# Patient Record
Sex: Female | Born: 1960 | Race: Black or African American | Hispanic: No | Marital: Single | Smoking: Current every day smoker
Health system: Southern US, Community
[De-identification: ages and names within clinical notes are randomized; demographics above are authoritative.]

## PROBLEM LIST (undated history)

## (undated) DIAGNOSIS — J449 Chronic obstructive pulmonary disease, unspecified: Secondary | ICD-10-CM

## (undated) DIAGNOSIS — I10 Essential (primary) hypertension: Secondary | ICD-10-CM

## (undated) DIAGNOSIS — E119 Type 2 diabetes mellitus without complications: Secondary | ICD-10-CM

## (undated) DIAGNOSIS — N289 Disorder of kidney and ureter, unspecified: Secondary | ICD-10-CM

## (undated) HISTORY — PX: TUBAL LIGATION: SHX77

---

## 1998-11-22 ENCOUNTER — Emergency Department (HOSPITAL_COMMUNITY): Admission: EM | Admit: 1998-11-22 | Discharge: 1998-11-22 | Payer: Self-pay | Admitting: Endocrinology

## 2000-03-23 ENCOUNTER — Emergency Department (HOSPITAL_COMMUNITY): Admission: EM | Admit: 2000-03-23 | Discharge: 2000-03-23 | Payer: Self-pay | Admitting: Emergency Medicine

## 2002-01-18 ENCOUNTER — Encounter: Payer: Self-pay | Admitting: Emergency Medicine

## 2002-01-18 ENCOUNTER — Emergency Department (HOSPITAL_COMMUNITY): Admission: EM | Admit: 2002-01-18 | Discharge: 2002-01-18 | Payer: Self-pay | Admitting: Emergency Medicine

## 2002-05-20 ENCOUNTER — Emergency Department (HOSPITAL_COMMUNITY): Admission: EM | Admit: 2002-05-20 | Discharge: 2002-05-20 | Payer: Self-pay | Admitting: Emergency Medicine

## 2002-07-21 ENCOUNTER — Emergency Department (HOSPITAL_COMMUNITY): Admission: EM | Admit: 2002-07-21 | Discharge: 2002-07-22 | Payer: Self-pay | Admitting: Emergency Medicine

## 2002-12-25 ENCOUNTER — Emergency Department (HOSPITAL_COMMUNITY): Admission: EM | Admit: 2002-12-25 | Discharge: 2002-12-25 | Payer: Self-pay | Admitting: *Deleted

## 2002-12-25 ENCOUNTER — Encounter: Payer: Self-pay | Admitting: *Deleted

## 2003-02-28 ENCOUNTER — Encounter: Payer: Self-pay | Admitting: Emergency Medicine

## 2003-02-28 ENCOUNTER — Emergency Department (HOSPITAL_COMMUNITY): Admission: EM | Admit: 2003-02-28 | Discharge: 2003-02-28 | Payer: Self-pay | Admitting: Physical Therapy

## 2003-08-14 ENCOUNTER — Emergency Department (HOSPITAL_COMMUNITY): Admission: EM | Admit: 2003-08-14 | Discharge: 2003-08-14 | Payer: Self-pay | Admitting: Emergency Medicine

## 2003-12-03 ENCOUNTER — Emergency Department (HOSPITAL_COMMUNITY): Admission: EM | Admit: 2003-12-03 | Discharge: 2003-12-03 | Payer: Self-pay | Admitting: *Deleted

## 2004-07-13 ENCOUNTER — Emergency Department (HOSPITAL_COMMUNITY): Admission: EM | Admit: 2004-07-13 | Discharge: 2004-07-13 | Payer: Self-pay | Admitting: Emergency Medicine

## 2005-07-05 ENCOUNTER — Emergency Department (HOSPITAL_COMMUNITY): Admission: EM | Admit: 2005-07-05 | Discharge: 2005-07-05 | Payer: Self-pay | Admitting: Emergency Medicine

## 2005-08-04 ENCOUNTER — Other Ambulatory Visit: Admission: RE | Admit: 2005-08-04 | Discharge: 2005-08-04 | Payer: Self-pay | Admitting: Obstetrics and Gynecology

## 2005-08-11 ENCOUNTER — Ambulatory Visit (HOSPITAL_COMMUNITY): Admission: RE | Admit: 2005-08-11 | Discharge: 2005-08-11 | Payer: Self-pay | Admitting: Obstetrics and Gynecology

## 2006-05-26 ENCOUNTER — Emergency Department (HOSPITAL_COMMUNITY): Admission: EM | Admit: 2006-05-26 | Discharge: 2006-05-27 | Payer: Self-pay | Admitting: Emergency Medicine

## 2006-12-08 ENCOUNTER — Emergency Department (HOSPITAL_COMMUNITY): Admission: EM | Admit: 2006-12-08 | Discharge: 2006-12-08 | Payer: Self-pay | Admitting: Emergency Medicine

## 2007-06-16 ENCOUNTER — Emergency Department (HOSPITAL_COMMUNITY): Admission: EM | Admit: 2007-06-16 | Discharge: 2007-06-16 | Payer: Self-pay | Admitting: Emergency Medicine

## 2007-10-17 ENCOUNTER — Emergency Department (HOSPITAL_COMMUNITY): Admission: EM | Admit: 2007-10-17 | Discharge: 2007-10-17 | Payer: Self-pay | Admitting: Emergency Medicine

## 2007-11-27 ENCOUNTER — Emergency Department (HOSPITAL_COMMUNITY): Admission: EM | Admit: 2007-11-27 | Discharge: 2007-11-27 | Payer: Self-pay | Admitting: Emergency Medicine

## 2008-02-05 ENCOUNTER — Emergency Department (HOSPITAL_COMMUNITY): Admission: EM | Admit: 2008-02-05 | Discharge: 2008-02-05 | Payer: Self-pay | Admitting: Emergency Medicine

## 2008-04-12 ENCOUNTER — Emergency Department (HOSPITAL_COMMUNITY): Admission: EM | Admit: 2008-04-12 | Discharge: 2008-04-12 | Payer: Self-pay | Admitting: Emergency Medicine

## 2008-04-30 ENCOUNTER — Emergency Department (HOSPITAL_COMMUNITY): Admission: EM | Admit: 2008-04-30 | Discharge: 2008-04-30 | Payer: Self-pay | Admitting: Emergency Medicine

## 2008-07-23 ENCOUNTER — Emergency Department (HOSPITAL_COMMUNITY): Admission: EM | Admit: 2008-07-23 | Discharge: 2008-07-23 | Payer: Self-pay | Admitting: Emergency Medicine

## 2008-10-21 ENCOUNTER — Emergency Department (HOSPITAL_COMMUNITY): Admission: EM | Admit: 2008-10-21 | Discharge: 2008-10-21 | Payer: Self-pay | Admitting: Emergency Medicine

## 2008-10-23 ENCOUNTER — Emergency Department (HOSPITAL_COMMUNITY): Admission: EM | Admit: 2008-10-23 | Discharge: 2008-10-23 | Payer: Self-pay | Admitting: Family Medicine

## 2008-12-29 ENCOUNTER — Emergency Department (HOSPITAL_COMMUNITY): Admission: EM | Admit: 2008-12-29 | Discharge: 2008-12-31 | Payer: Self-pay | Admitting: Emergency Medicine

## 2009-07-03 ENCOUNTER — Emergency Department (HOSPITAL_COMMUNITY): Admission: EM | Admit: 2009-07-03 | Discharge: 2009-07-03 | Payer: Self-pay | Admitting: Emergency Medicine

## 2010-05-01 ENCOUNTER — Inpatient Hospital Stay (HOSPITAL_COMMUNITY): Admission: EM | Admit: 2010-05-01 | Discharge: 2009-08-10 | Payer: Self-pay | Admitting: Emergency Medicine

## 2010-06-15 ENCOUNTER — Encounter: Payer: Self-pay | Admitting: Otolaryngology

## 2010-08-13 LAB — COMPREHENSIVE METABOLIC PANEL
ALT: 35 U/L (ref 0–35)
Albumin: 4.3 g/dL (ref 3.5–5.2)
BUN: 10 mg/dL (ref 6–23)
CO2: 22 mEq/L (ref 19–32)
Chloride: 100 mEq/L (ref 96–112)
Creatinine, Ser: 0.96 mg/dL (ref 0.4–1.2)
GFR calc Af Amer: >60 mL/min (ref >60)
Glucose, Bld: 97 mg/dL (ref 70–99)
Total Bilirubin: 1.9 mg/dL — ABNORMAL HIGH (ref 0.3–1.2)

## 2010-08-13 LAB — CBC
Hemoglobin: 15.3 g/dL — ABNORMAL HIGH (ref 12.0–15.0)
Platelets: 218 10*3/uL (ref 150–400)
WBC: 7.1 10*3/uL (ref 4.0–10.5)

## 2010-08-13 LAB — ETHANOL: Alcohol, Ethyl (B): 64 mg/dL — ABNORMAL HIGH (ref 0–10)

## 2010-08-13 LAB — RAPID URINE DRUG SCREEN, HOSP PERFORMED
Amphetamines: NOT DETECTED
Cocaine: POSITIVE — AB
Opiates: NOT DETECTED

## 2010-08-13 LAB — DIFFERENTIAL
Basophils Relative: 1 % (ref 0–1)
Lymphs Abs: 0.6 10*3/uL — ABNORMAL LOW (ref 0.7–4.0)
Monocytes Absolute: 0.3 10*3/uL (ref 0.1–1.0)
Neutrophils Relative %: 86 % — ABNORMAL HIGH (ref 43–77)

## 2010-08-15 LAB — CBC
HCT: 37.4 % (ref 36.0–46.0)
HCT: 41.3 % (ref 36.0–46.0)
Hemoglobin: 12.9 g/dL (ref 12.0–15.0)
MCHC: 34 g/dL (ref 30.0–36.0)
MCHC: 34.4 g/dL (ref 30.0–36.0)
MCV: 96.2 fL (ref 78.0–100.0)
Platelets: 189 10*3/uL (ref 150–400)
RBC: 3.89 MIL/uL (ref 3.87–5.11)
RBC: 4.3 MIL/uL (ref 3.87–5.11)
WBC: 8.2 10*3/uL (ref 4.0–10.5)

## 2010-08-15 LAB — BASIC METABOLIC PANEL
CO2: 21 mEq/L (ref 19–32)
Chloride: 107 mEq/L (ref 96–112)
Creatinine, Ser: 0.7 mg/dL (ref 0.4–1.2)
GFR calc Af Amer: >60 mL/min (ref >60)
Potassium: 4.1 mEq/L (ref 3.5–5.1)
Sodium: 138 mEq/L (ref 135–145)

## 2010-08-15 LAB — POCT I-STAT, CHEM 8
BUN: 4 mg/dL — ABNORMAL LOW (ref 6–23)
Chloride: 107 mEq/L (ref 96–112)
Creatinine, Ser: 0.7 mg/dL (ref 0.4–1.2)
Sodium: 138 mEq/L (ref 135–145)
TCO2: 22 mmol/L (ref 0–100)

## 2010-08-15 LAB — APTT: aPTT: 29 seconds (ref 24–37)

## 2010-08-15 LAB — DIFFERENTIAL
Basophils Absolute: 0 10*3/uL (ref 0.0–0.1)
Basophils Relative: 1 % (ref 0–1)
Eosinophils Absolute: 0 10*3/uL (ref 0.0–0.7)
Lymphs Abs: 1.1 10*3/uL (ref 0.7–4.0)
Neutrophils Relative %: 84 % — ABNORMAL HIGH (ref 43–77)

## 2010-08-15 LAB — PROTIME-INR: INR: 1.16 (ref 0.00–1.49)

## 2010-08-30 LAB — POCT I-STAT, CHEM 8
Calcium, Ion: 1.13 mmol/L (ref 1.12–1.32)
Chloride: 100 mEq/L (ref 96–112)
Creatinine, Ser: 1.2 mg/dL (ref 0.4–1.2)
Glucose, Bld: 120 mg/dL — ABNORMAL HIGH (ref 70–99)
Potassium: 3.6 mEq/L (ref 3.5–5.1)

## 2010-08-30 LAB — URINALYSIS, ROUTINE W REFLEX MICROSCOPIC
Glucose, UA: NEGATIVE mg/dL
Ketones, ur: 15 mg/dL — AB
Protein, ur: 30 mg/dL — AB
pH: 5.5 (ref 5.0–8.0)

## 2010-08-30 LAB — DIFFERENTIAL
Basophils Absolute: 0 10*3/uL (ref 0.0–0.1)
Basophils Relative: 1 % (ref 0–1)
Eosinophils Relative: 3 % (ref 0–5)
Lymphocytes Relative: 28 % (ref 12–46)
Monocytes Absolute: 0.8 10*3/uL (ref 0.1–1.0)
Monocytes Relative: 10 % (ref 3–12)
Neutro Abs: 4.8 10*3/uL (ref 1.7–7.7)

## 2010-08-30 LAB — URINE MICROSCOPIC-ADD ON

## 2010-08-30 LAB — CBC
HCT: 46.8 % — ABNORMAL HIGH (ref 36.0–46.0)
Hemoglobin: 16.2 g/dL — ABNORMAL HIGH (ref 12.0–15.0)
MCHC: 34.5 g/dL (ref 30.0–36.0)
RBC: 4.87 MIL/uL (ref 3.87–5.11)
RDW: 12.9 % (ref 11.5–15.5)

## 2010-08-30 LAB — RAPID URINE DRUG SCREEN, HOSP PERFORMED
Cocaine: POSITIVE — AB
Opiates: NOT DETECTED
Tetrahydrocannabinol: POSITIVE — AB

## 2010-09-02 LAB — URINALYSIS, ROUTINE W REFLEX MICROSCOPIC
Bilirubin Urine: NEGATIVE
Glucose, UA: NEGATIVE mg/dL
Specific Gravity, Urine: 1.023 (ref 1.005–1.030)
pH: 5 (ref 5.0–8.0)

## 2010-09-02 LAB — POCT I-STAT, CHEM 8
BUN: 11 mg/dL (ref 6–23)
Calcium, Ion: 1.17 mmol/L (ref 1.12–1.32)
Chloride: 106 mEq/L (ref 96–112)
Creatinine, Ser: 1 mg/dL (ref 0.4–1.2)
Glucose, Bld: 89 mg/dL (ref 70–99)
HCT: 47 % — ABNORMAL HIGH (ref 36.0–46.0)
Hemoglobin: 16 g/dL — ABNORMAL HIGH (ref 12.0–15.0)
Potassium: 3.8 meq/L (ref 3.5–5.1)
Sodium: 138 meq/L (ref 135–145)
TCO2: 24 mmol/L (ref 0–100)

## 2010-09-02 LAB — CBC
HCT: 43.6 % (ref 36.0–46.0)
MCHC: 34.3 g/dL (ref 30.0–36.0)
MCV: 91.5 fL (ref 78.0–100.0)
Platelets: 187 10*3/uL (ref 150–400)
WBC: 5.5 10*3/uL (ref 4.0–10.5)

## 2010-09-02 LAB — DIFFERENTIAL
Basophils Relative: 1 % (ref 0–1)
Eosinophils Absolute: 0.2 10*3/uL (ref 0.0–0.7)
Eosinophils Relative: 4 % (ref 0–5)
Lymphs Abs: 2.4 10*3/uL (ref 0.7–4.0)

## 2010-09-02 LAB — URINE CULTURE

## 2010-09-02 LAB — GLUCOSE, CAPILLARY: Glucose-Capillary: 88 mg/dL (ref 70–99)

## 2010-09-02 LAB — URINE MICROSCOPIC-ADD ON

## 2011-02-12 LAB — BASIC METABOLIC PANEL
CO2: 33 — ABNORMAL HIGH
Calcium: 9.6
Creatinine, Ser: 0.78
GFR calc Af Amer: >60
GFR calc non Af Amer: >60
Glucose, Bld: 105 — ABNORMAL HIGH

## 2011-02-12 LAB — DIFFERENTIAL
Basophils Absolute: 0.2 — ABNORMAL HIGH
Basophils Relative: 3 — ABNORMAL HIGH
Neutro Abs: 3
Neutrophils Relative %: 48

## 2011-02-12 LAB — POCT CARDIAC MARKERS: Operator id: 4295

## 2011-02-12 LAB — CBC
MCHC: 33
RBC: 4.56
RDW: 19.7 — ABNORMAL HIGH

## 2011-02-18 LAB — POCT I-STAT, CHEM 8
BUN: 9
Calcium, Ion: 1.16
Creatinine, Ser: 1
Glucose, Bld: 96
Sodium: 142
TCO2: 26

## 2011-02-18 LAB — POCT CARDIAC MARKERS: Myoglobin, poc: 36.7

## 2011-02-24 LAB — CBC
Hemoglobin: 13.6
MCHC: 33.6
RBC: 4.34

## 2011-02-24 LAB — POCT CARDIAC MARKERS
CKMB, poc: <1 — ABNORMAL LOW
Myoglobin, poc: 41.2

## 2011-02-24 LAB — DIFFERENTIAL
Band Neutrophils: 0
Basophils Absolute: 0
Basophils Relative: 0
Eosinophils Absolute: 0.3
Lymphocytes Relative: 50 — ABNORMAL HIGH
Lymphs Abs: 2.8
Monocytes Absolute: 0.6
Monocytes Relative: 10

## 2011-02-24 LAB — BASIC METABOLIC PANEL
CO2: 26
Calcium: 9.6
GFR calc Af Amer: >60
GFR calc non Af Amer: >60
Sodium: 140

## 2011-03-09 LAB — BASIC METABOLIC PANEL
Calcium: 9.5
GFR calc Af Amer: >60
GFR calc non Af Amer: >60
Sodium: 139

## 2011-03-09 LAB — POCT CARDIAC MARKERS
CKMB, poc: <1 — ABNORMAL LOW
Myoglobin, poc: 36.9

## 2011-03-23 ENCOUNTER — Inpatient Hospital Stay (INDEPENDENT_AMBULATORY_CARE_PROVIDER_SITE_OTHER)
Admission: RE | Admit: 2011-03-23 | Discharge: 2011-03-23 | Disposition: A | Discharge status: Home / Self Care | Payer: Self-pay | Source: Ambulatory Visit | Attending: Family Medicine | Admitting: Family Medicine

## 2011-03-23 DIAGNOSIS — K089 Disorder of teeth and supporting structures, unspecified: Secondary | ICD-10-CM

## 2013-01-02 ENCOUNTER — Emergency Department (HOSPITAL_COMMUNITY): Payer: Self-pay

## 2013-01-02 ENCOUNTER — Emergency Department (HOSPITAL_COMMUNITY)
Admission: EM | Admit: 2013-01-02 | Discharge: 2013-01-02 | Disposition: A | Discharge status: Home / Self Care | Payer: Self-pay | Attending: Emergency Medicine | Admitting: Emergency Medicine

## 2013-01-02 ENCOUNTER — Encounter (HOSPITAL_COMMUNITY): Payer: Self-pay | Admitting: Emergency Medicine

## 2013-01-02 DIAGNOSIS — R0602 Shortness of breath: Secondary | ICD-10-CM | POA: Insufficient documentation

## 2013-01-02 DIAGNOSIS — Z79899 Other long term (current) drug therapy: Secondary | ICD-10-CM | POA: Insufficient documentation

## 2013-01-02 DIAGNOSIS — J029 Acute pharyngitis, unspecified: Secondary | ICD-10-CM | POA: Insufficient documentation

## 2013-01-02 DIAGNOSIS — K219 Gastro-esophageal reflux disease without esophagitis: Secondary | ICD-10-CM | POA: Insufficient documentation

## 2013-01-02 DIAGNOSIS — M546 Pain in thoracic spine: Secondary | ICD-10-CM | POA: Insufficient documentation

## 2013-01-02 DIAGNOSIS — J3489 Other specified disorders of nose and nasal sinuses: Secondary | ICD-10-CM | POA: Insufficient documentation

## 2013-01-02 DIAGNOSIS — R05 Cough: Secondary | ICD-10-CM

## 2013-01-02 DIAGNOSIS — J449 Chronic obstructive pulmonary disease, unspecified: Secondary | ICD-10-CM

## 2013-01-02 DIAGNOSIS — F17200 Nicotine dependence, unspecified, uncomplicated: Secondary | ICD-10-CM | POA: Insufficient documentation

## 2013-01-02 DIAGNOSIS — J441 Chronic obstructive pulmonary disease with (acute) exacerbation: Secondary | ICD-10-CM | POA: Insufficient documentation

## 2013-01-02 HISTORY — DX: Chronic obstructive pulmonary disease, unspecified: J44.9

## 2013-01-02 LAB — CBC WITH DIFFERENTIAL/PLATELET
Basophils Relative: 1 % (ref 0–1)
Eosinophils Absolute: 0.4 10*3/uL (ref 0.0–0.7)
Eosinophils Relative: 6 % — ABNORMAL HIGH (ref 0–5)
Hemoglobin: 15.4 g/dL — ABNORMAL HIGH (ref 12.0–15.0)
MCH: 32.3 pg (ref 26.0–34.0)
MCHC: 36.1 g/dL — ABNORMAL HIGH (ref 30.0–36.0)
MCV: 89.5 fL (ref 78.0–100.0)
Monocytes Relative: 11 % (ref 3–12)
Neutrophils Relative %: 48 % (ref 43–77)
Platelets: 187 10*3/uL (ref 150–400)

## 2013-01-02 LAB — BASIC METABOLIC PANEL
BUN: 10 mg/dL (ref 6–23)
Calcium: 9.3 mg/dL (ref 8.4–10.5)
GFR calc Af Amer: >90 mL/min (ref >90)
GFR calc non Af Amer: >90 mL/min (ref >90)
Potassium: 3.9 mEq/L (ref 3.5–5.1)

## 2013-01-02 MED ORDER — IBUPROFEN 800 MG PO TABS
800.0000 mg | ORAL_TABLET | Freq: Once | ORAL | Status: AC
  Administered 2013-01-02: 800 mg via ORAL
  Filled 2013-01-02: qty 1

## 2013-01-02 MED ORDER — PREDNISONE 20 MG PO TABS
60.0000 mg | ORAL_TABLET | Freq: Once | ORAL | Status: AC
  Administered 2013-01-02: 60 mg via ORAL
  Filled 2013-01-02: qty 3

## 2013-01-02 MED ORDER — GUAIFENESIN 100 MG/5ML PO LIQD: 100.0000 mg | ORAL | Status: DC | PRN

## 2013-01-02 MED ORDER — PREDNISONE 20 MG PO TABS: 40.0000 mg | ORAL_TABLET | Freq: Every day | ORAL | Status: DC

## 2013-01-02 NOTE — ED Notes (Signed)
Started to feel bad last night w/ sore throat ahs had a cough for 1 days cp started yesterday at 7 pm thinks it is from coughing hard started in back and it went around to chest no n/v

## 2013-01-02 NOTE — ED Notes (Signed)
Pt ambulated in hallways. Pt denies SOB, dizziness, lightheadedness or CP. Pt O2 sats consistently 98-100%/RA HR 75-85bpm. Pt had steady gait.

## 2013-01-02 NOTE — ED Provider Notes (Signed)
CSN: [id number]     Arrival date & time 01/02/13  8657 History     First MD Initiated Contact with Patient 01/02/13 0848     Chief Complaint  Patient presents with  . Chest Pain  . Sore Throat   (Consider location/radiation/quality/duration/timing/severity/associated sxs/prior Treatment) HPI Comments:  Patient is a 52 year old female with a history of COPD, GERD, and tobacco use, smoking 1ppd, who presents for back and chest pain with associated congested, nonproductive cough with onset yesterday. Patient states that symptoms started yesterday as pain in her thoracic back. Pain radiated around to chest and was without modifying factors; patient has tried repositioning for symptomatic relief. She states cough followed pain yesterday and has been worsening since onset. Patient denies associated fever, syncope, vision changes, nausea, vomiting, numbness/tingling, and extremity weakness. Patient has not cut back on smoking since symptoms onset. Denies relationship with PCP or pulmonologist. Patient works at a group home. Denies hx of ACS, CAD, and PE.  Patient is a 52 y.o. female presenting with chest pain and pharyngitis. The history is provided by the patient. No language interpreter was used.  Chest Pain Associated symptoms: cough and shortness of breath   Associated symptoms: no dizziness, no fever, no nausea, no numbness, not vomiting and no weakness   Sore Throat Associated symptoms include chest pain and coughing. Pertinent negatives include no fever, nausea, numbness, vomiting or weakness.    Past Medical History  Diagnosis Date  . COPD (chronic obstructive pulmonary disease)    No past surgical history on file. No family history on file. History  Substance Use Topics  . Smoking status: Current Every Day Smoker  . Smokeless tobacco: Not on file  . Alcohol Use: Yes   OB History   Grav Para Term Preterm Abortions TAB SAB Ect Mult Living                 Review of Systems   Constitutional: Negative for fever.  Eyes: Negative for visual disturbance.  Respiratory: Positive for cough and shortness of breath.   Cardiovascular: Positive for chest pain.  Gastrointestinal: Negative for nausea, vomiting and diarrhea.  Neurological: Negative for dizziness, syncope, weakness, light-headedness and numbness.  All other systems reviewed and are negative.   Allergies  Review of patient's allergies indicates no known allergies.  Home Medications   Current Outpatient Rx  Name  Route  Sig  Dispense  Refill  . guaiFENesin (ROBITUSSIN) 100 MG/5ML liquid   Oral   Take 5-10 mLs (100-200 mg total) by mouth every 4 (four) hours as needed for cough.   60 mL   0   . predniSONE (DELTASONE) 20 MG tablet   Oral   Take 2 tablets (40 mg total) by mouth daily.   10 tablet   0    BP 158/107  Pulse 78  Temp(Src) 98.3 F (36.8 C) (Oral)  Resp 16  SpO2 98%  Physical Exam  Nursing note and vitals reviewed. Constitutional: She is oriented to person, place, and time. She appears well-developed and well-nourished. No distress.  HENT:  Head: Normocephalic and atraumatic.  Mouth/Throat: Oropharynx is clear and moist. No oropharyngeal exudate.  Eyes: Conjunctivae and EOM are normal. Pupils are equal, round, and reactive to light. No scleral icterus.  Neck: Normal range of motion. Neck supple.  Cardiovascular: Normal rate, regular rhythm, normal heart sounds and intact distal pulses.   Pulmonary/Chest: Effort normal. No respiratory distress. She has no wheezes. She has no rales.  +adventitious sounds at  b/l bases  Abdominal: Soft. She exhibits no distension. There is no tenderness.  Musculoskeletal: Normal range of motion.  Lymphadenopathy:    She has no cervical adenopathy.  Neurological: She is alert and oriented to person, place, and time.  Skin: Skin is warm and dry. No rash noted. She is not diaphoretic. No erythema. No pallor.  Psychiatric: She has a normal mood and  affect. Her behavior is normal.   ED Course   Procedures (including critical care time)  Labs Reviewed  CBC WITH DIFFERENTIAL - Abnormal; Notable for the following:    Hemoglobin 15.4 (*)    MCHC 36.1 (*)    Eosinophils Relative 6 (*)    All other components within normal limits  BASIC METABOLIC PANEL  POCT I-STAT TROPONIN I   Dg Chest 2 View  01/02/2013   *RADIOLOGY REPORT*  Clinical Data: Shortness of breath  CHEST - 2 VIEW  Comparison: 08/07/2009  Findings: Normal heart size and vascularity.  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  No superimposed pneumonia, collapse or consolidation.  No effusion or pneumothorax.  Trachea is midline.  IMPRESSION: COPD/emphysema.  No superimposed acute process   Original Report Authenticated By: Judie Petit. Miles Costain, M.D.    Date: 01/07/2013  Rate: 80  Rhythm: normal sinus rhythm  QRS Axis: normal  Intervals: normal  ST/T Wave abnormalities: normal  Conduction Disutrbances:none  Narrative Interpretation: NSR with probable left atrial enlargement; no STEMI or ischemic changes  Old EKG Reviewed: unchanged from 08/07/2009 I have personally reviewed and interpreted this EKG  1. COPD (chronic obstructive pulmonary disease)   2. Cough    MDM  Patient presents for back pain radiating to chest x 2 days. Physical exam findings as above and patient well and nontoxic appearing; afebrile. Labs without leukocytosis, anemia, hemoconcentration, or electrolyte imbalance. Kidney function preserved. EKG unchanged from prior and troponin 0.00; doubt ACS given atypical nature of symptoms, work up, labs, and stable EKG. CXR without evidence of pneumonia, PTX, pleural effusion, or mass/lesion. Doubt PE given lack of tachycardia, dyspnea, tachypnea, and hypoxia. Also doubt dissection given hemostability and lack of mediastinal widening or change in vascularity on CXR. Symptoms most consistent with COPD; changes evident on CXR. Appopriate for d/c  with PCP follow up for further evaluation of symptoms. Rx for prednisone and Robitussin provided for symptoms. Indications for ED return discussed and patient agreeable to plan.     Antony Madura, PA-C 01/07/13 1255

## 2013-01-03 ENCOUNTER — Encounter (HOSPITAL_COMMUNITY): Payer: Self-pay | Admitting: Nurse Practitioner

## 2013-01-03 ENCOUNTER — Emergency Department (HOSPITAL_COMMUNITY)
Admission: EM | Admit: 2013-01-03 | Discharge: 2013-01-03 | Disposition: A | Discharge status: Home / Self Care | Payer: Self-pay | Attending: Emergency Medicine | Admitting: Emergency Medicine

## 2013-01-03 DIAGNOSIS — J441 Chronic obstructive pulmonary disease with (acute) exacerbation: Secondary | ICD-10-CM | POA: Insufficient documentation

## 2013-01-03 DIAGNOSIS — R197 Diarrhea, unspecified: Secondary | ICD-10-CM | POA: Insufficient documentation

## 2013-01-03 DIAGNOSIS — T380X5A Adverse effect of glucocorticoids and synthetic analogues, initial encounter: Secondary | ICD-10-CM | POA: Insufficient documentation

## 2013-01-03 DIAGNOSIS — R112 Nausea with vomiting, unspecified: Secondary | ICD-10-CM | POA: Insufficient documentation

## 2013-01-03 DIAGNOSIS — F17200 Nicotine dependence, unspecified, uncomplicated: Secondary | ICD-10-CM | POA: Insufficient documentation

## 2013-01-03 MED ORDER — ONDANSETRON 4 MG PO TBDP: 4.0000 mg | ORAL_TABLET | Freq: Three times a day (TID) | ORAL | Status: DC | PRN

## 2013-01-03 MED ORDER — ONDANSETRON 4 MG PO TBDP
4.0000 mg | ORAL_TABLET | Freq: Once | ORAL | Status: AC
  Administered 2013-01-03: 4 mg via ORAL
  Filled 2013-01-03: qty 1

## 2013-01-03 MED ORDER — ALBUTEROL SULFATE HFA 108 (90 BASE) MCG/ACT IN AERS: 1.0000 | INHALATION_SPRAY | Freq: Four times a day (QID) | RESPIRATORY_TRACT | Status: DC | PRN

## 2013-01-03 NOTE — ED Notes (Signed)
Pt was started on prednisone yesterday for copd but she states it is making her vomit and poop and she cant tolerate it

## 2013-01-03 NOTE — ED Provider Notes (Signed)
CSN: [id number]     Arrival date & time 01/03/13  0911 History     First MD Initiated Contact with Patient 01/03/13 1008     Chief Complaint  Patient presents with  . Medication Reaction   (Consider location/radiation/quality/duration/timing/severity/associated sxs/prior Treatment) The history is provided by the patient and medical records.   Patient presents to the ED for adverse reaction from prednisone. Patient states she was given prednisone yesterday at an ED visit for COPD exacerbation. She took medication as directed, but now is having severe nausea, vomiting, and diarrhea. She has never taken prednisone before.  Denies any blood in emesis or stool.  No recent use of antibiotics. Patient states she would like to complete a course of prednisone because it does help her breathing. Does not have an inhaler for home use.  No fevers, sweats, or chills.  Denies any chest pain, SOB, or abdominal pain.  Past Medical History  Diagnosis Date  . COPD (chronic obstructive pulmonary disease)    History reviewed. No pertinent past surgical history. History reviewed. No pertinent family history. History  Substance Use Topics  . Smoking status: Current Every Day Smoker  . Smokeless tobacco: Not on file  . Alcohol Use: Yes   OB History   Grav Para Term Preterm Abortions TAB SAB Ect Mult Living                 Review of Systems  Gastrointestinal: Positive for nausea, vomiting and diarrhea.  All other systems reviewed and are negative.    Allergies  Review of patient's allergies indicates no known allergies.  Home Medications   Current Outpatient Rx  Name  Route  Sig  Dispense  Refill  . ibuprofen (ADVIL,MOTRIN) 200 MG tablet   Oral   Take 400 mg by mouth once.         Marland Kitchen albuterol (PROVENTIL HFA;VENTOLIN HFA) 108 (90 BASE) MCG/ACT inhaler   Inhalation   Inhale 1-2 puffs into the lungs every 6 (six) hours as needed for wheezing.   1 Inhaler   0   . ondansetron (ZOFRAN ODT)  4 MG disintegrating tablet   Oral   Take 1 tablet (4 mg total) by mouth every 8 (eight) hours as needed for nausea.   10 tablet   0    BP 143/65  Pulse 110  Temp(Src) 98.5 F (36.9 C) (Oral)  Resp 17  Ht 5\' 2"  (1.575 m)  Wt 130 lb (58.968 kg)  BMI 23.77 kg/m2  SpO2 96%  Physical Exam  Nursing note and vitals reviewed. Constitutional: She is oriented to person, place, and time. She appears well-developed and well-nourished.  HENT:  Head: Normocephalic and atraumatic.  Mouth/Throat: Oropharynx is clear and moist.  Eyes: Conjunctivae and EOM are normal. Pupils are equal, round, and reactive to light.  Neck: Normal range of motion.  Cardiovascular: Normal rate, regular rhythm and normal heart sounds.   Pulmonary/Chest: Effort normal and breath sounds normal.  Abdominal: Soft. Bowel sounds are normal. There is no tenderness. There is no guarding, no CVA tenderness, no tenderness at McBurney's point and negative Murphy's sign.  Musculoskeletal: Normal range of motion.  Neurological: She is alert and oriented to person, place, and time.  Skin: Skin is warm and dry.  Psychiatric: She has a normal mood and affect.    ED Course   Procedures (including critical care time)  Labs Reviewed - No data to display Dg Chest 2 View  01/02/2013   *RADIOLOGY REPORT*  Clinical Data: Shortness of breath  CHEST - 2 VIEW  Comparison: 08/07/2009  Findings: Normal heart size and vascularity.  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  No superimposed pneumonia, collapse or consolidation.  No effusion or pneumothorax.  Trachea is midline.  IMPRESSION: COPD/emphysema.  No superimposed acute process   Original Report Authenticated By: Judie Petit. Shick, M.D.   1. Nausea & vomiting   2. Diarrhea     MDM   No active vomiting in the ED. Patient given Zofran and albuterol inhaler. I have advised her she can stop taking the prednisone but she states she would like to finish taking  it -- advised she can try taking zofran and imodium with prednisone but if no improvement she should stop prednisone.  Rx zofran and albuterol given.  FU with [facility] if sx not improving.  Discussed plan with pt, she agreed.  Return precautions advised.  Garlon Hatchet, PA-C 01/03/13 [phone]

## 2013-01-04 NOTE — ED Provider Notes (Signed)
Medical screening examination/treatment/procedure(s) were performed by non-physician practitioner and as supervising physician I was immediately available for consultation/collaboration.   Arish Redner M Zurich Carreno, DO 01/04/13 2206 

## 2013-01-09 NOTE — ED Provider Notes (Signed)
Medical screening examination/treatment/procedure(s) were performed by non-physician practitioner and as supervising physician I was immediately available for consultation/collaboration.  Taimane Stimmel R. Lashaunda Schild, MD 01/09/13 0755 

## 2014-01-30 ENCOUNTER — Emergency Department (HOSPITAL_COMMUNITY): Payer: Self-pay

## 2014-01-30 ENCOUNTER — Emergency Department (HOSPITAL_COMMUNITY)
Admission: EM | Admit: 2014-01-30 | Discharge: 2014-01-30 | Disposition: A | Discharge status: Home / Self Care | Payer: Self-pay | Attending: Emergency Medicine | Admitting: Emergency Medicine

## 2014-01-30 ENCOUNTER — Encounter (HOSPITAL_COMMUNITY): Payer: Self-pay | Admitting: Emergency Medicine

## 2014-01-30 DIAGNOSIS — J449 Chronic obstructive pulmonary disease, unspecified: Secondary | ICD-10-CM | POA: Insufficient documentation

## 2014-01-30 DIAGNOSIS — K5900 Constipation, unspecified: Secondary | ICD-10-CM | POA: Insufficient documentation

## 2014-01-30 DIAGNOSIS — J4489 Other specified chronic obstructive pulmonary disease: Secondary | ICD-10-CM | POA: Insufficient documentation

## 2014-01-30 DIAGNOSIS — R1084 Generalized abdominal pain: Secondary | ICD-10-CM | POA: Insufficient documentation

## 2014-01-30 DIAGNOSIS — F17200 Nicotine dependence, unspecified, uncomplicated: Secondary | ICD-10-CM | POA: Insufficient documentation

## 2014-01-30 LAB — COMPREHENSIVE METABOLIC PANEL
ALBUMIN: 4.1 g/dL (ref 3.5–5.2)
ALK PHOS: 101 U/L (ref 39–117)
ALT: 22 U/L (ref 0–35)
AST: 25 U/L (ref 0–37)
Anion gap: 12 (ref 5–15)
BILIRUBIN TOTAL: 0.8 mg/dL (ref 0.3–1.2)
BUN: 11 mg/dL (ref 6–23)
CHLORIDE: 97 meq/L (ref 96–112)
CO2: 28 mEq/L (ref 19–32)
Calcium: 9.4 mg/dL (ref 8.4–10.5)
Creatinine, Ser: 0.76 mg/dL (ref 0.50–1.10)
GFR calc Af Amer: >90 mL/min (ref >90)
GFR calc non Af Amer: >90 mL/min (ref >90)
Glucose, Bld: 105 mg/dL — ABNORMAL HIGH (ref 70–99)
POTASSIUM: 3.7 meq/L (ref 3.7–5.3)
Sodium: 137 mEq/L (ref 137–147)
Total Protein: 8.1 g/dL (ref 6.0–8.3)

## 2014-01-30 LAB — CBC WITH DIFFERENTIAL/PLATELET
BASOS ABS: 0 10*3/uL (ref 0.0–0.1)
BASOS PCT: 1 % (ref 0–1)
EOS ABS: 0.1 10*3/uL (ref 0.0–0.7)
EOS PCT: 1 % (ref 0–5)
HEMATOCRIT: 46.8 % — AB (ref 36.0–46.0)
Hemoglobin: 16.3 g/dL — ABNORMAL HIGH (ref 12.0–15.0)
LYMPHS PCT: 43 % (ref 12–46)
Lymphs Abs: 3 10*3/uL (ref 0.7–4.0)
MCH: 31.7 pg (ref 26.0–34.0)
MCHC: 34.8 g/dL (ref 30.0–36.0)
MCV: 90.9 fL (ref 78.0–100.0)
MONO ABS: 0.9 10*3/uL (ref 0.1–1.0)
Monocytes Relative: 12 % (ref 3–12)
Neutro Abs: 3.1 10*3/uL (ref 1.7–7.7)
Neutrophils Relative %: 43 % (ref 43–77)
Platelets: 236 10*3/uL (ref 150–400)
RBC: 5.15 MIL/uL — ABNORMAL HIGH (ref 3.87–5.11)
RDW: 12.3 % (ref 11.5–15.5)
WBC: 7.1 10*3/uL (ref 4.0–10.5)

## 2014-01-30 LAB — URINALYSIS, ROUTINE W REFLEX MICROSCOPIC
Glucose, UA: NEGATIVE mg/dL
KETONES UR: 15 mg/dL — AB
Nitrite: NEGATIVE
Protein, ur: NEGATIVE mg/dL
SPECIFIC GRAVITY, URINE: 1.03 (ref 1.005–1.030)
UROBILINOGEN UA: 1 mg/dL (ref 0.0–1.0)
pH: 5.5 (ref 5.0–8.0)

## 2014-01-30 LAB — URINE MICROSCOPIC-ADD ON

## 2014-01-30 LAB — LIPASE, BLOOD: LIPASE: 19 U/L (ref 11–59)

## 2014-01-30 LAB — POC OCCULT BLOOD, ED: Fecal Occult Bld: NEGATIVE

## 2014-01-30 MED ORDER — LACTULOSE 20 GM/30ML PO SOLN: 30.0000 mL | Freq: Three times a day (TID) | ORAL | Status: DC | PRN

## 2014-01-30 NOTE — ED Provider Notes (Signed)
CSN: [id number]     Arrival date & time 01/30/14  1015 History   First MD Initiated Contact with Patient 01/30/14 1238     Chief Complaint  Patient presents with  . Constipation  . Emesis     (Consider location/radiation/quality/duration/timing/severity/associated sxs/prior Treatment) HPI  Marissa Owens is a 53 y.o. female who states that she has had upper abdominal pain, for 3 weeks, associated with decreased stooling, and constipation. She also has intermittent coughing. She denies fever. She has had urinary frequency and odor with urination. She has a nonodorous vaginal discharge. She is post menopausal. She denies fever, chills, weakness, or dizziness. She tried milk of magnesia, without relief of the stooling problem. There are no other known modifying factors.   Past Medical History  Diagnosis Date  . COPD (chronic obstructive pulmonary disease)    History reviewed. No pertinent past surgical history. No family history on file. History  Substance Use Topics  . Smoking status: Current Every Day Smoker  . Smokeless tobacco: Not on file  . Alcohol Use: Yes   OB History   Grav Para Term Preterm Abortions TAB SAB Ect Mult Living                 Review of Systems  All other systems reviewed and are negative.     Allergies  Review of patient's allergies indicates no known allergies.  Home Medications   Prior to Admission medications   Medication Sig Start Date End Date Taking? Authorizing Provider  ibuprofen (ADVIL,MOTRIN) 200 MG tablet Take 400 mg by mouth once.   Yes Historical Provider, MD  Multiple Vitamin (MULTIVITAMIN) tablet Take 1 tablet by mouth daily.   Yes Historical Provider, MD  Lactulose 20 GM/30ML SOLN Take 30 mLs (20 g total) by mouth 3 (three) times daily with meals as needed (Constipation). 01/30/14   Flint Melter, MD   BP 155/106  Pulse 70  Temp(Src) 99.1 F (37.3 C)  Resp 17  Ht  (1.575 m)  Wt 136 lb (61.689 kg)  BMI 24.87 kg/m2   SpO2 94% Physical Exam  Nursing note and vitals reviewed. Constitutional: She is oriented to person, place, and time. She appears well-developed and well-nourished.  HENT:  Head: Normocephalic and atraumatic.  Eyes: Conjunctivae and EOM are normal. Pupils are equal, round, and reactive to light.  Neck: Normal range of motion and phonation normal. Neck supple.  Cardiovascular: Normal rate, regular rhythm and intact distal pulses.   Pulmonary/Chest: Effort normal and breath sounds normal. She exhibits no tenderness.  Abdominal: Soft. She exhibits distension (Mild). She exhibits no mass. There is tenderness (epigastric, mild). There is no rebound and no guarding.  Hyperactive bowel sounds  Musculoskeletal: Normal range of motion.  Neurological: She is alert and oriented to person, place, and time. She exhibits normal muscle tone.  Skin: Skin is warm and dry.  Psychiatric: She has a normal mood and affect. Her behavior is normal. Judgment and thought content normal.    ED Course  Procedures (including critical care time) Labs Review Labs Reviewed  COMPREHENSIVE METABOLIC PANEL - Abnormal; Notable for the following:    Glucose, Bld 105 (*)    All other components within normal limits  CBC WITH DIFFERENTIAL - Abnormal; Notable for the following:    RBC 5.15 (*)    Hemoglobin 16.3 (*)    HCT 46.8 (*)    All other components within normal limits  URINALYSIS, ROUTINE W REFLEX MICROSCOPIC - Abnormal; Notable  for the following:    Color, Urine AMBER (*)    Hgb urine dipstick MODERATE (*)    Bilirubin Urine SMALL (*)    Ketones, ur 15 (*)    Leukocytes, UA TRACE (*)    All other components within normal limits  URINE MICROSCOPIC-ADD ON - Abnormal; Notable for the following:    Crystals CA OXALATE CRYSTALS (*)    All other components within normal limits  LIPASE, BLOOD  POC OCCULT BLOOD, ED    Imaging Review No results found.   EKG Interpretation None      MDM   Final  diagnoses:  Generalized abdominal pain  Constipation, unspecified constipation type    Nonspecific abdominal pain, with clinical history for constipation. No evidence for acute urticarial infection, metabolic instability, or serious intra-abdominal process.  Nursing Notes Reviewed/ Care Coordinated Applicable Imaging Reviewed Interpretation of Laboratory Data incorporated into ED treatment  The patient appears reasonably screened and/or stabilized for discharge and I doubt any other medical condition or other [facility] requiring further screening, evaluation, or treatment in the ED at this time prior to discharge.  Plan: Home Medications- Lactulose for constipation; Home Treatments- rest; return here if the recommended treatment, does not improve the symptoms; Recommended follow up- PCP prn    Flint Melter, MD 02/02/14 0040

## 2014-01-30 NOTE — Discharge Instructions (Signed)
Drink plenty of fluids, and stay on a high-fiber diet and to your having regular bowel movements. See a medical doctor, if you're not feeling better in one or 2 weeks.     Abdominal Pain Many things can cause abdominal pain. Usually, abdominal pain is not caused by a disease and will improve without treatment. It can often be observed and treated at home. Your health care provider will do a physical exam and possibly order blood tests and X-rays to help determine the seriousness of your pain. However, in many cases, more time must pass before a clear cause of the pain can be found. Before that point, your health care provider may not know if you need more testing or further treatment. HOME CARE INSTRUCTIONS  Monitor your abdominal pain for any changes. The following actions may help to alleviate any discomfort you are experiencing:  Only take over-the-counter or prescription medicines as directed by your health care provider.  Do not take laxatives unless directed to do so by your health care provider.  Try a clear liquid diet (broth, tea, or water) as directed by your health care provider. Slowly move to a bland diet as tolerated. SEEK MEDICAL CARE IF:  You have unexplained abdominal pain.  You have abdominal pain associated with nausea or diarrhea.  You have pain when you urinate or have a bowel movement.  You experience abdominal pain that wakes you in the night.  You have abdominal pain that is worsened or improved by eating food.  You have abdominal pain that is worsened with eating fatty foods.  You have a fever. SEEK IMMEDIATE MEDICAL CARE IF:   Your pain does not go away within 2 hours.  You keep throwing up (vomiting).  Your pain is felt only in portions of the abdomen, such as the right side or the left lower portion of the abdomen.  You pass bloody or black tarry stools. MAKE SURE YOU:  Understand these instructions.   Will watch your condition.   Will get  help right away if you are not doing well or get worse.  Document Released: 02/18/2005 Document Revised: 05/16/2013 Document Reviewed: 01/18/2013 [facility] Patient Information 2015 [place], [place]. This information is not intended to replace advice given to you by your health care provider. Make sure you discuss any questions you have with your health care provider. High-Fiber Diet Fiber is found in fruits, vegetables, and grains. A high-fiber diet encourages the addition of more whole grains, legumes, fruits, and vegetables in your diet. The recommended amount of fiber for adult males is 38 g per day. For adult females, it is 25 g per day. Pregnant and lactating women should get 28 g of fiber per day. If you have a digestive or bowel problem, ask your caregiver for advice before adding high-fiber foods to your diet. Eat a variety of high-fiber foods instead of only a select few type of foods.  PURPOSE  To increase stool bulk.  To make bowel movements more regular to prevent constipation.  To lower cholesterol.  To prevent overeating. WHEN IS THIS DIET USED?  It may be used if you have constipation and hemorrhoids.  It may be used if you have uncomplicated diverticulosis (intestine condition) and irritable bowel syndrome.  It may be used if you need help with weight management.  It may be used if you want to add it to your diet as a protective measure against atherosclerosis, diabetes, and cancer. SOURCES OF FIBER  Whole-grain breads and  cereals.  Fruits, such as apples, oranges, bananas, berries, prunes, and pears.  Vegetables, such as green peas, carrots, sweet potatoes, beets, broccoli, cabbage, spinach, and artichokes.  Legumes, such split peas, soy, lentils.  Almonds. FIBER CONTENT IN FOODS Starches and Grains / Dietary Fiber (g)  Cheerios, 1 cup / 3 g  Corn Flakes cereal, 1 cup / 0.7 g  Rice crispy treat cereal, 1 cup / 0.3 g  Instant oatmeal (cooked),  cup / 2  g  Frosted wheat cereal, 1 cup / 5.1 g  Brown, long-grain rice (cooked), 1 cup / 3.5 g  White, long-grain rice (cooked), 1 cup / 0.6 g  Enriched macaroni (cooked), 1 cup / 2.5 g Legumes / Dietary Fiber (g)  Baked beans (canned, plain, or vegetarian),  cup / 5.2 g  Kidney beans (canned),  cup / 6.8 g  Pinto beans (cooked),  cup / 5.5 g Breads and Crackers / Dietary Fiber (g)  Plain or honey graham crackers, 2 squares / 0.7 g  Saltine crackers, 3 squares / 0.3 g  Plain, salted pretzels, 10 pieces / 1.8 g  Whole-wheat bread, 1 slice / 1.9 g  White bread, 1 slice / 0.7 g  Raisin bread, 1 slice / 1.2 g  Plain bagel, 3 oz / 2 g  Flour tortilla, 1 oz / 0.9 g  Corn tortilla, 1 small / 1.5 g  Hamburger or hotdog bun, 1 small / 0.9 g Fruits / Dietary Fiber (g)  Apple with skin, 1 medium / 4.4 g  Sweetened applesauce,  cup / 1.5 g  Banana,  medium / 1.5 g  Grapes, 10 grapes / 0.4 g  Orange, 1 small / 2.3 g  Raisin, 1.5 oz / 1.6 g  Melon, 1 cup / 1.4 g Vegetables / Dietary Fiber (g)  Green beans (canned),  cup / 1.3 g  Carrots (cooked),  cup / 2.3 g  Broccoli (cooked),  cup / 2.8 g  Peas (cooked),  cup / 4.4 g  Mashed potatoes,  cup / 1.6 g  Lettuce, 1 cup / 0.5 g  Corn (canned),  cup / 1.6 g  Tomato,  cup / 1.1 g Document Released: 05/11/2005 Document Revised: 11/10/2011 Document Reviewed: 08/13/2011 ExitCare Patient Information 2015 [place], [place]. This information is not intended to replace advice given to you by your health care provider. Make sure you discuss any questions you have with your health care provider.  Emergency Department Resource Guide 1) Find a Doctor and Pay Out of Pocket Although you won't have to find out who is covered by your insurance plan, it is a good idea to ask around and get recommendations. You will then need to call the office and see if the doctor you have chosen will accept you as a new patient and what  types of options they offer for patients who are self-pay. Some doctors offer discounts or will set up payment plans for their patients who do not have insurance, but you will need to ask so you aren't surprised when you get to your appointment.  2) Contact Your Local Health Department Not all health departments have doctors that can see patients for sick visits, but many do, so it is worth a call to see if yours does. If you don't know where your local health department is, you can check in your phone book. The CDC also has a tool to help you locate your state's health department, and many state websites also have listings of  all of their local health departments.  3) Find a Walk-in Clinic If your illness is not likely to be very severe or complicated, you may want to try a walk in clinic. These are popping up all over the country in pharmacies, drugstores, and shopping centers. They're usually staffed by nurse practitioners or physician assistants that have been trained to treat common illnesses and complaints. They're usually fairly quick and inexpensive. However, if you have serious medical issues or chronic medical problems, these are probably not your best option.  No Primary Care Doctor: - Call Health Connect at  [phone] - they can help you locate a primary care doctor that  accepts your insurance, provides certain services, etc. - Physician Referral Service- [phone]  Chronic Pain Problems: Organization         Address  Phone   Notes  [facility]  [phone] Patients need to be referred by their primary care doctor.   Medication Assistance: Organization         Address  Phone   Notes  [facility] Medication [facility] [address] [place] [place], [place] [address] [phone] --Must be a resident of [facility] -- Must have NO insurance coverage whatsoever (no Medicaid/ Medicare, etc.) -- The pt. MUST have a primary care doctor that  directs their care regularly and follows them in the community   [facility]  [phone]   [employer]  [phone]    Agencies that provide inexpensive medical care: Organization         Address  Phone   Notes  [facility]  [phone]   [facility]    [phone]   [facility] [address] [place], [place] [address] [phone]   [facility] [address], [place] [phone]   [facility]anned Parenthood    [phone]   [facility]    [phone]   [facility]  [address], [place] Phone:  [phone], Fax:  [phone] Hours of Operation:  9 am - 6 pm, M-F.  Also accepts Medicaid/Medicare and self-pay.  [facility]  [address], [place] Phone: [phone], Fax: [phone]. Hours of Operation:  8:30 am - 5:30 pm, M-F.  Also accepts Medicaid and self-pay.  [facility] [place] [address] [place] Phone: [phone]   [facility] [address] Natasha Bence [place], [place] [phone], Ext. [phone] Mondays & Thursdays: 7-9 AM.  First 15 patients are seen on a first come, first serve basis.    Medicaid-accepting [facility] Providers:  Organization         Address  Phone   Notes  [facility] [address], [place] [phone] Also accepts self-pay patients.  [facility] [address] Laurell Josephs [place], [place]  [phone]   [facility] [address], [place] [phone]   [facility] [address], [place] [phone]   Renaye Rakers [address], [place]   [phone] Only accepts [place] Access [place] patients after they have their name applied to their card.   Self-Pay (no insurance) in [facility]:  Location manager   Notes  Sickle Cell Patients, [facility] [address] [place], [place] [phone]   [facility] [address] [place], [place] [phone]   [facility] [place]  [address], [place] [phone]   [facility]/Dr. Osei-Bonsu  [address] [place] or [address], [place] [phone] Phone number for both [place] and [place] locations is the same.  [facility] [address] [place] [phone]   [facility] [address], [place] or [address] [phone]   [facility] [address], [place] [phone], phone; [phone], fax Sees patients 1st and 3rd Saturday of every month.  Must not qualify for public or private insurance (i.e. Medicaid, Medicare, [place] Health Choice, Veterans' Benefits)  Household income should be no more than 200% of the poverty level The clinic cannot treat you if you are pregnant or think you are pregnant  Sexually transmitted diseases are not treated at the clinic.    Dental Care: Organization         Address  Phone  Notes  [facility] [address] [place], [place] [phone] Accepts children up to age 77 who are enrolled in [place] or [place] Health Choice; pregnant women with a Medicaid card; and children who have applied for Medicaid or [place] Health Choice, but were declined, whose parents can pay a reduced fee at time of service.  [facility] of [facility]  [address], [place] [phone] Accepts children up to age 86 who are enrolled in [place] or [place] Health Choice; pregnant women with a Medicaid card; and children who have applied for Medicaid or [place] Health Choice, but were declined, whose parents can pay a reduced fee at time of  service.  [facility]  [address] [place], [place] [phone] Patients are seen by appointment only. Walk-ins are not accepted. [facility] will see patients 72 years of age and older. Monday - Tuesday (8am-5pm) Most Wednesdays (8:30-5pm) $30 per visit, cash only  [facility] Adult Dental Access PRO[facility]  [address], [facility] [phone] Patients are seen by appointment only. Walk-ins are not accepted. [facility] will see patients 85 years of age and older. One Wednesday Evening (Monthly: Volunteer Based).  $30 per visit, cash only  [employer]  [phone] for adults; Children under age 41, call Graduate Pediatric Dentistry at [phone]. Children aged 78-14, please call [phone] to request a pediatric application.  Dental services are provided in all areas of dental care including fillings, crowns and bridges, complete and partial dentures, implants, gum treatment, root canals, and extractions. Preventive care is also provided. Treatment is provided to both adults and children. Patients are selected via a lottery and there is often a waiting list.   [facility] [address], [place]  [phone] [email or web address]   [facility] [address] [place], [place] [phone], Ext. [phone] Second and Fourth Thursday of each month, opens at 6:30 AM; Clinic ends at 9 AM.  Patients are seen on a first-come first-served basis, and a limited number are seen during each clinic.   [facility]  [address] Ether Griffins [place], [place] [phone]   Eligibility Requirements  You must have lived in [place], [place], or [place] counties for at least the last three months.   You cannot be eligible for state or federal sponsored [place], including [employer], [place], or [employer].   You generally cannot be eligible for healthcare insurance through your employer.     How to apply: Eligibility screenings are held every Tuesday and Wednesday afternoon from 1:00 pm until 4:00 pm. You do not need an appointment for the interview!  [facility] [address], [place], [place] [phone]   [facility] Health Department  [phone]   [facility] Health Department  [phone]   [facility] Health Department  [phone]    Behavioral Health Resources in the Community: Intensive Outpatient Programs Organization         Address  Phone  Notes  [facility] Services [address], [place], [place] [phone]   [facility] [address] [place], [place] [phone]   [facility] [address], [place], [place]  [phone]   [facility] Mental Health [address]  [place], [place] [phone] or [phone]   Substance Abuse Resources Organization         Address  Phone  Notes  [facility]  [phone]   [facility]  [phone]   [facility] [place]  [phone]   Floydene Flock  [phone]   Residential & Outpatient Substance Abuse Program  [phone]   Psychological Services Organization         Address  Phone  Notes  [facility]  [phone]   [facility]  [phone]   [facility] Mental Health [address], [place] [phone] or [phone]    Mobile Crisis Teams Organization         Address  Phone  Notes  [facility], Mobile Crisis Care Unit  [phone]   [facility]  [address] [place], [place] [phone]   Doristine Locks [address] [place] [phone]    Self-Help/Support Groups Organization         Address  Phone             Notes  [facility] [place] - variety of support groups  [phone]- [phone] Call for more information  Narcotics Anonymous (NA), [facility] [address], [employer] [place]  2 meetings at this location   Statistician         Address  Phone  Notes  [facility] [address],    [place]  [phone]   [facility]  [address], [place] [address], [place], [place] [phone]   [facility] [address] [place], [place] [phone] Admissions: 8am-3pm M-F  [facility] [address]    [place], [place] [phone]   [facility] [address] Starling Manns [place], [place] [phone]   [facility] [address],  [place], [place] [phone]   [facility] - Intensive Outpatient [address] Laurell Josephs [address], [place], [place] [phone]   [facility] (Addiction Recovery Care [facility] [address] [place]  [place], [place] [phone] or [phone]   [facility] ([facility]) [address], [place], [place] [phone] Accepts Medicaid  [facility] [address],  [place] [phone] Substance Abuse/Addiction Treatment   [facility] Resources Organization  Address  Phone  Notes  [facility]  [phone]   Angie Fava, PhD [address] Ervin Knack [place], [place]   [phone] or [phone]   [facility] Behavioral   [address] [place], [place] [phone]   [facility] [address], [place], [place] [phone] Insurance/Medicaid/sponsorship through [facility] and Families [address]                                    [place], [place] [phone] Therapy/tele-psych/case  [facility] [address], [place] [phone]    Dr. Lolly Mustache  [phone]   [facility]  [employer] [facility] Dept. 1) [address] [place] 2) [address], [place] 3)  [address], [place] [phone]  [phone]   [facility] Child Abuse  Hotline 8734662315 or [phone] (After Hours)

## 2014-01-30 NOTE — ED Notes (Signed)
consitation abd pain and cough vomiting x 2 weeks

## 2014-05-06 ENCOUNTER — Emergency Department (HOSPITAL_COMMUNITY): Payer: Self-pay

## 2014-05-06 ENCOUNTER — Encounter (HOSPITAL_COMMUNITY): Payer: Self-pay | Admitting: *Deleted

## 2014-05-06 ENCOUNTER — Emergency Department (HOSPITAL_COMMUNITY)
Admission: EM | Admit: 2014-05-06 | Discharge: 2014-05-06 | Disposition: A | Discharge status: Home / Self Care | Payer: Self-pay | Attending: Emergency Medicine | Admitting: Emergency Medicine

## 2014-05-06 DIAGNOSIS — R1032 Left lower quadrant pain: Secondary | ICD-10-CM | POA: Insufficient documentation

## 2014-05-06 DIAGNOSIS — Z720 Tobacco use: Secondary | ICD-10-CM | POA: Insufficient documentation

## 2014-05-06 DIAGNOSIS — Z79899 Other long term (current) drug therapy: Secondary | ICD-10-CM | POA: Insufficient documentation

## 2014-05-06 DIAGNOSIS — R109 Unspecified abdominal pain: Secondary | ICD-10-CM

## 2014-05-06 DIAGNOSIS — Z3202 Encounter for pregnancy test, result negative: Secondary | ICD-10-CM | POA: Insufficient documentation

## 2014-05-06 DIAGNOSIS — J449 Chronic obstructive pulmonary disease, unspecified: Secondary | ICD-10-CM | POA: Insufficient documentation

## 2014-05-06 LAB — COMPREHENSIVE METABOLIC PANEL
ALK PHOS: 98 U/L (ref 39–117)
ALT: 27 U/L (ref 0–35)
AST: 35 U/L (ref 0–37)
Albumin: 3.9 g/dL (ref 3.5–5.2)
Anion gap: 13 (ref 5–15)
BILIRUBIN TOTAL: 0.6 mg/dL (ref 0.3–1.2)
BUN: 13 mg/dL (ref 6–23)
CHLORIDE: 103 meq/L (ref 96–112)
CO2: 25 mEq/L (ref 19–32)
Calcium: 9.6 mg/dL (ref 8.4–10.5)
Creatinine, Ser: 0.87 mg/dL (ref 0.50–1.10)
GFR calc Af Amer: 86 mL/min — ABNORMAL LOW (ref >90)
GFR calc non Af Amer: 74 mL/min — ABNORMAL LOW (ref >90)
Glucose, Bld: 163 mg/dL — ABNORMAL HIGH (ref 70–99)
POTASSIUM: 3.7 meq/L (ref 3.7–5.3)
Sodium: 141 mEq/L (ref 137–147)
Total Protein: 7.5 g/dL (ref 6.0–8.3)

## 2014-05-06 LAB — CBC WITH DIFFERENTIAL/PLATELET
BASOS ABS: 0 10*3/uL (ref 0.0–0.1)
BASOS PCT: 1 % (ref 0–1)
Eosinophils Absolute: 0.1 10*3/uL (ref 0.0–0.7)
Eosinophils Relative: 1 % (ref 0–5)
HCT: 43.6 % (ref 36.0–46.0)
Hemoglobin: 14.7 g/dL (ref 12.0–15.0)
LYMPHS PCT: 31 % (ref 12–46)
Lymphs Abs: 2 10*3/uL (ref 0.7–4.0)
MCH: 30.8 pg (ref 26.0–34.0)
MCHC: 33.7 g/dL (ref 30.0–36.0)
MCV: 91.4 fL (ref 78.0–100.0)
Monocytes Absolute: 0.5 10*3/uL (ref 0.1–1.0)
Monocytes Relative: 8 % (ref 3–12)
NEUTROS ABS: 3.8 10*3/uL (ref 1.7–7.7)
NEUTROS PCT: 59 % (ref 43–77)
Platelets: 213 10*3/uL (ref 150–400)
RBC: 4.77 MIL/uL (ref 3.87–5.11)
RDW: 12.3 % (ref 11.5–15.5)
WBC: 6.5 10*3/uL (ref 4.0–10.5)

## 2014-05-06 LAB — WET PREP, GENITAL
Clue Cells Wet Prep HPF POC: NONE SEEN
TRICH WET PREP: NONE SEEN
Yeast Wet Prep HPF POC: NONE SEEN

## 2014-05-06 LAB — URINALYSIS, ROUTINE W REFLEX MICROSCOPIC
BILIRUBIN URINE: NEGATIVE
GLUCOSE, UA: NEGATIVE mg/dL
KETONES UR: NEGATIVE mg/dL
LEUKOCYTES UA: NEGATIVE
Nitrite: NEGATIVE
Protein, ur: NEGATIVE mg/dL
Specific Gravity, Urine: 1.027 (ref 1.005–1.030)
Urobilinogen, UA: 1 mg/dL (ref 0.0–1.0)
pH: 6 (ref 5.0–8.0)

## 2014-05-06 LAB — URINE MICROSCOPIC-ADD ON

## 2014-05-06 LAB — PREGNANCY, URINE: Preg Test, Ur: NEGATIVE

## 2014-05-06 LAB — HIV ANTIBODY (ROUTINE TESTING W REFLEX): HIV 1&2 Ab, 4th Generation: NONREACTIVE

## 2014-05-06 MED ORDER — OXYCODONE-ACETAMINOPHEN 5-325 MG PO TABS: 1.0000 | ORAL_TABLET | Freq: Four times a day (QID) | ORAL | Status: DC | PRN

## 2014-05-06 MED ORDER — IOHEXOL 300 MG/ML  SOLN
20.0000 mL | INTRAMUSCULAR | Status: AC
  Administered 2014-05-06: 25 mL via ORAL

## 2014-05-06 MED ORDER — HYDROMORPHONE HCL 1 MG/ML IJ SOLN
1.0000 mg | Freq: Once | INTRAMUSCULAR | Status: AC
  Administered 2014-05-06: 1 mg via INTRAVENOUS
  Filled 2014-05-06: qty 1

## 2014-05-06 MED ORDER — SODIUM CHLORIDE 0.9 % IV BOLUS (SEPSIS)
1000.0000 mL | Freq: Once | INTRAVENOUS | Status: AC
  Administered 2014-05-06: 1000 mL via INTRAVENOUS

## 2014-05-06 MED ORDER — ONDANSETRON HCL 4 MG PO TABS: 4.0000 mg | ORAL_TABLET | Freq: Four times a day (QID) | ORAL | Status: DC

## 2014-05-06 MED ORDER — ONDANSETRON HCL 4 MG/2ML IJ SOLN
4.0000 mg | Freq: Once | INTRAMUSCULAR | Status: AC
Start: 2014-05-06 — End: 2014-05-06
  Administered 2014-05-06: 4 mg via INTRAVENOUS
  Filled 2014-05-06: qty 2

## 2014-05-06 NOTE — ED Notes (Signed)
Pt finished contrast and CT called. 

## 2014-05-06 NOTE — ED Provider Notes (Signed)
CSN: [id number]     Arrival date & time 05/06/14  0845 History   First MD Initiated Contact with Patient 05/06/14 [phone]     Chief Complaint  Patient presents with  . Flank Pain     (Consider location/radiation/quality/duration/timing/severity/associated sxs/prior Treatment) HPI Marissa Owens is a 53 year old female with past medical history of COPD who presents the ER complaining of abdominal pain. Patient reports a sudden onset of a dull pain in her left lower quadrant of her abdomen which began this morning. Patient states the pain woke her up from sleep, and she thought the pain was gas pain and attempted to use the bathroom. Patient states she is unable to have a bowel movement or urinate. Patient states the pain "comes and goes" and radiates from her left lower abdomen into her left flank. Patient states her pain is aggravated with movement and positioning, and slightly alleviated with certain positions. Patient states the past several days she's had a frequency and urgency to urinate which was abnormal for her. Patient denies associated nausea, vomiting, fever, diarrhea, dysuria.  Past Medical History  Diagnosis Date  . COPD (chronic obstructive pulmonary disease)    History reviewed. No pertinent past surgical history. No family history on file. History  Substance Use Topics  . Smoking status: Current Every Day Smoker  . Smokeless tobacco: Not on file  . Alcohol Use: Yes   OB History    No data available     Review of Systems  Constitutional: Negative for fever.  HENT: Negative for trouble swallowing.   Eyes: Negative for visual disturbance.  Respiratory: Negative for shortness of breath.   Cardiovascular: Negative for chest pain.  Gastrointestinal: Positive for abdominal pain. Negative for nausea and vomiting.  Genitourinary: Positive for frequency. Negative for dysuria, vaginal bleeding and vaginal discharge.  Musculoskeletal: Negative for neck pain.  Skin: Negative  for rash.  Neurological: Negative for dizziness, weakness and numbness.  Psychiatric/Behavioral: Negative.       Allergies  Review of patient's allergies indicates no known allergies.  Home Medications   Prior to Admission medications   Medication Sig Start Date End Date Taking? Authorizing Provider  Lactulose 20 GM/30ML SOLN Take 30 mLs (20 g total) by mouth 3 (three) times daily with meals as needed (Constipation). 01/30/14   Flint Melter, MD  Multiple Vitamin (MULTIVITAMIN) tablet Take 1 tablet by mouth daily.    Historical Provider, MD   BP 144/84 mmHg  Pulse 61  Temp(Src) 97.6 F (36.4 C) (Oral)  Resp 18  SpO2 99% Physical Exam  Constitutional: She is oriented to person, place, and time. She appears well-developed and well-nourished. No distress.  HENT:  Head: Normocephalic and atraumatic.  Mouth/Throat: Oropharynx is clear and moist. No oropharyngeal exudate.  Eyes: Right eye exhibits no discharge. Left eye exhibits no discharge. No scleral icterus.  Neck: Normal range of motion.  Cardiovascular: Normal rate, regular rhythm and normal heart sounds.   No murmur heard. Pulmonary/Chest: Effort normal and breath sounds normal. No respiratory distress.  Abdominal: Soft. Bowel sounds are normal. She exhibits no shifting dullness, no distension, no ascites and no mass. There is tenderness in the left lower quadrant. There is no rigidity, no guarding, no tenderness at McBurney's point and negative Murphy's sign.    Genitourinary: Vagina normal. Pelvic exam was performed with patient supine. No labial fusion. There is no rash, tenderness, lesion or injury on the right labia. There is no rash, tenderness, lesion or injury on the left  labia. Cervix exhibits no motion tenderness, no discharge and no friability. Right adnexum displays no mass, no tenderness and no fullness. Left adnexum displays no mass, no tenderness and no fullness. No erythema, tenderness or bleeding in the vagina.  No foreign body around the vagina. No signs of injury around the vagina. No vaginal discharge found.  Chaperone present during entire pelvic exam.  Musculoskeletal: Normal range of motion. She exhibits no edema or tenderness.  Neurological: She is alert and oriented to person, place, and time. No cranial nerve deficit. Coordination normal.  Skin: Skin is warm and dry. No rash noted. She is not diaphoretic.  Psychiatric: She has a normal mood and affect.  Nursing note and vitals reviewed.   ED Course  Procedures (including critical care time) Labs Review Labs Reviewed - No data to display  Imaging Review No results found.   EKG Interpretation None      MDM   Final diagnoses:  None   Patient here is left-sided lower abdominal pain radiating to her left flank. Patient without any history of kidney stones, and patient's pain reproducible to palpation. Description of patient's pain is a dull ache which comes and goes which is consistent with a kidney stone, however the fact that it is reproducible left likely. Pelvic exam unremarkable. Patient's pain is not adnexal or cervical motion tenderness. Will follow-up with CT abdomen pelvis.  Patient's lab work unremarkable for any acute pathology. No leukocytosis, anemia. UA unremarkable for any signs of infection. UA remarkable for hematuria, which may be consistent with kidney stone.  CT abdomen pelvis with impression: 1. There is left perinephric stranding/edema which extends along a mildly dilated left ureter. There is mild dilation of the left renal pelvis. There is no ureteral stone. No CT finding to indicate a source of obstruction. The findings could be from pyelonephritis. Alternatively, the findings could reflect obstruction from a non radiopaque stone or other ureteral process. Findings could potentially reflect a recently passed stone. 2. No intrarenal stones. No renal masses. 3. Right lower lobe coarse reticular opacity,  most likely atelectasis. Consider infiltrate if there are consistent symptoms. 4. Scattered colonic diverticula. No diverticulitis. 5. No other abnormalities.  Based on patient's clinical correlation of her pain and the fact that her pain decreased significantly after her first dose of pain medication, I believe it is most likely patient has passed a kidney stone. Patient denying any shortness of breath and is non-hypoxic, nontachypneic, non-tachycardic here. No clinical signs of pneumonia present, and atelectasis and right lower lobe more likely than infiltrate. Patient is nontoxic, nonseptic appearing, in no apparent distress.  Patient's pain and other symptoms adequately managed in emergency department.  Fluid bolus given.  Labs, imaging and vitals reviewed.  Patient does not meet the SIRS or Sepsis criteria.  On repeat exam patient does not have a surgical abdomin and there are no peritoneal signs.  No indication of appendicitis, bowel obstruction, bowel perforation, cholecystitis, diverticulitis, PID or ectopic pregnancy.  Patient discharged home with symptomatic treatment and given strict instructions for follow-up with urology regarding a possible kidney stone. I discussed return precautions with patient and encourage her to call or return to ER should she have any questions or concerns. Patient was agreeable to this plan.  BP 139/87 mmHg  Pulse 78  Temp(Src) 98.3 F (36.8 C) (Oral)  Resp 18  SpO2 94%  Signed,  Ladona MowJoe Zackarie Chason, PA-C 4:26 PM  Patient seen and discussed with Dr. Gerhard Munchobert Lockwood, M.D.  Monte FantasiaJoseph W Mekhi Lascola, PA-C 05/06/14 1626  Gerhard Munchobert Lockwood, MD 05/09/14 [phone]

## 2014-05-06 NOTE — ED Notes (Signed)
Pt states that she began having left flank and lower abdominal pain this morning. Pt states that she has not been able to urinate.

## 2014-05-06 NOTE — Discharge Instructions (Signed)

## 2014-05-06 NOTE — ED Notes (Signed)
Pt returned from CT and ambulated to the bathroom

## 2014-05-07 LAB — GC/CHLAMYDIA PROBE AMP
CT Probe RNA: NEGATIVE
GC PROBE AMP APTIMA: NEGATIVE

## 2015-08-06 ENCOUNTER — Emergency Department (HOSPITAL_COMMUNITY)
Admission: EM | Admit: 2015-08-06 | Discharge: 2015-08-06 | Disposition: A | Discharge status: Home / Self Care | Payer: BLUE CROSS/BLUE SHIELD | Attending: Emergency Medicine | Admitting: Emergency Medicine

## 2015-08-06 ENCOUNTER — Encounter (HOSPITAL_COMMUNITY): Payer: Self-pay | Admitting: *Deleted

## 2015-08-06 DIAGNOSIS — R350 Frequency of micturition: Secondary | ICD-10-CM | POA: Insufficient documentation

## 2015-08-06 DIAGNOSIS — R300 Dysuria: Secondary | ICD-10-CM | POA: Diagnosis not present

## 2015-08-06 DIAGNOSIS — R1030 Lower abdominal pain, unspecified: Secondary | ICD-10-CM

## 2015-08-06 DIAGNOSIS — Z87442 Personal history of urinary calculi: Secondary | ICD-10-CM | POA: Diagnosis not present

## 2015-08-06 DIAGNOSIS — I10 Essential (primary) hypertension: Secondary | ICD-10-CM | POA: Insufficient documentation

## 2015-08-06 DIAGNOSIS — Z9851 Tubal ligation status: Secondary | ICD-10-CM | POA: Diagnosis not present

## 2015-08-06 DIAGNOSIS — M545 Low back pain: Secondary | ICD-10-CM | POA: Insufficient documentation

## 2015-08-06 DIAGNOSIS — R110 Nausea: Secondary | ICD-10-CM | POA: Diagnosis not present

## 2015-08-06 DIAGNOSIS — R109 Unspecified abdominal pain: Secondary | ICD-10-CM | POA: Diagnosis present

## 2015-08-06 DIAGNOSIS — J449 Chronic obstructive pulmonary disease, unspecified: Secondary | ICD-10-CM | POA: Diagnosis not present

## 2015-08-06 DIAGNOSIS — Z3202 Encounter for pregnancy test, result negative: Secondary | ICD-10-CM | POA: Insufficient documentation

## 2015-08-06 DIAGNOSIS — H538 Other visual disturbances: Secondary | ICD-10-CM | POA: Insufficient documentation

## 2015-08-06 DIAGNOSIS — F17200 Nicotine dependence, unspecified, uncomplicated: Secondary | ICD-10-CM | POA: Insufficient documentation

## 2015-08-06 HISTORY — DX: Disorder of kidney and ureter, unspecified: N28.9

## 2015-08-06 LAB — COMPREHENSIVE METABOLIC PANEL
ALBUMIN: 3.5 g/dL (ref 3.5–5.0)
ALK PHOS: 88 U/L (ref 38–126)
ALT: 29 U/L (ref 14–54)
AST: 45 U/L — AB (ref 15–41)
Anion gap: 9 (ref 5–15)
BUN: 8 mg/dL (ref 6–20)
CALCIUM: 9.5 mg/dL (ref 8.9–10.3)
CO2: 26 mmol/L (ref 22–32)
Chloride: 106 mmol/L (ref 101–111)
Creatinine, Ser: 0.83 mg/dL (ref 0.44–1.00)
GFR calc Af Amer: >60 mL/min (ref >60)
GFR calc non Af Amer: >60 mL/min (ref >60)
GLUCOSE: 95 mg/dL (ref 65–99)
Potassium: 4 mmol/L (ref 3.5–5.1)
SODIUM: 141 mmol/L (ref 135–145)
TOTAL PROTEIN: 6.7 g/dL (ref 6.5–8.1)
Total Bilirubin: 0.5 mg/dL (ref 0.3–1.2)

## 2015-08-06 LAB — CBC
HEMATOCRIT: 43 % (ref 36.0–46.0)
Hemoglobin: 15 g/dL (ref 12.0–15.0)
MCH: 31.7 pg (ref 26.0–34.0)
MCHC: 34.9 g/dL (ref 30.0–36.0)
MCV: 90.9 fL (ref 78.0–100.0)
Platelets: 206 10*3/uL (ref 150–400)
RBC: 4.73 MIL/uL (ref 3.87–5.11)
RDW: 12.6 % (ref 11.5–15.5)
WBC: 6.2 10*3/uL (ref 4.0–10.5)

## 2015-08-06 LAB — URINALYSIS, ROUTINE W REFLEX MICROSCOPIC
BILIRUBIN URINE: NEGATIVE
Glucose, UA: NEGATIVE mg/dL
Ketones, ur: NEGATIVE mg/dL
NITRITE: NEGATIVE
PH: 7.5 (ref 5.0–8.0)
Protein, ur: NEGATIVE mg/dL
SPECIFIC GRAVITY, URINE: 1.009 (ref 1.005–1.030)

## 2015-08-06 LAB — LIPASE, BLOOD: Lipase: 28 U/L (ref 11–51)

## 2015-08-06 LAB — URINE MICROSCOPIC-ADD ON

## 2015-08-06 LAB — I-STAT BETA HCG BLOOD, ED (MC, WL, AP ONLY)

## 2015-08-06 MED ORDER — ACETAMINOPHEN 325 MG PO TABS
650.0000 mg | ORAL_TABLET | Freq: Once | ORAL | Status: AC
  Administered 2015-08-06: 650 mg via ORAL
  Filled 2015-08-06: qty 2

## 2015-08-06 MED ORDER — AMLODIPINE BESYLATE 5 MG PO TABS: 5.0000 mg | ORAL_TABLET | Freq: Every day | ORAL | Status: DC

## 2015-08-06 MED ORDER — KETOROLAC TROMETHAMINE 60 MG/2ML IM SOLN
30.0000 mg | Freq: Once | INTRAMUSCULAR | Status: AC
  Administered 2015-08-06: 30 mg via INTRAMUSCULAR
  Filled 2015-08-06: qty 2

## 2015-08-06 MED ORDER — AMLODIPINE BESYLATE 5 MG PO TABS
5.0000 mg | ORAL_TABLET | Freq: Once | ORAL | Status: AC
  Administered 2015-08-06: 5 mg via ORAL
  Filled 2015-08-06: qty 1

## 2015-08-06 MED ORDER — NAPROXEN 500 MG PO TABS: 500.0000 mg | ORAL_TABLET | Freq: Two times a day (BID) | ORAL | Status: DC

## 2015-08-06 MED ORDER — CEPHALEXIN 500 MG PO CAPS
500.0000 mg | ORAL_CAPSULE | Freq: Three times a day (TID) | ORAL | Status: DC
Start: 2015-08-06 — End: 2015-11-12

## 2015-08-06 NOTE — ED Notes (Signed)
Pt states she started having lower back and lower abdominal pain yesterday and states she passed a stone, Reports her urine is dribbling.  Pt states she has the urge to pee. Hx of kidney stones

## 2015-08-06 NOTE — Discharge Instructions (Signed)
You were seen in the ER today for evaluation of back pain and abdominal pain that I think is likely related to the kidney stone that you passed. Your urine has a tiny bit of bacteria in it which is probably normal for you but given your history of urinary symptoms I will give you a one week course of antibiotics just in case. You may follow up with urology for further evaluation if you would like.  Your blood pressure was high today. I started you on a blood pressure medicine called Norvasc. Please contact one of the clinics below to establish primary care for follow up. They will need to monitor your blood pressure. Please also call the ophthalmologist to schedule an eye appointment given your vision changes this year.  Take medications as prescribed. Return to the emergency room for worsening condition or new concerning symptoms. Follow up with your regular doctor. If you don't have a regular doctor use one of the numbers below to establish a primary care doctor.   Emergency Department Resource Guide 1) Find a Doctor and Pay Out of Pocket Although you won't have to find out who is covered by your insurance plan, it is a good idea to ask around and get recommendations. You will then need to call the office and see if the doctor you have chosen will accept you as a new patient and what types of options they offer for patients who are self-pay. Some doctors offer discounts or will set up payment plans for their patients who do not have insurance, but you will need to ask so you aren't surprised when you get to your appointment.  2) Contact Your Local Health Department Not all health departments have doctors that can see patients for sick visits, but many do, so it is worth a call to see if yours does. If you don't know where your local health department is, you can check in your phone book. The CDC also has a tool to help you locate your state's health department, and many state websites also have  listings of all of their local health departments.  3) Find a Walk-in Clinic If your illness is not likely to be very severe or complicated, you may want to try a walk in clinic. These are popping up all over the country in pharmacies, drugstores, and shopping centers. They're usually staffed by nurse practitioners or physician assistants that have been trained to treat common illnesses and complaints. They're usually fairly quick and inexpensive. However, if you have serious medical issues or chronic medical problems, these are probably not your best option.  No Primary Care Doctor: - Call Health Connect at  [phone] - they can help you locate a primary care doctor that  accepts your insurance, provides certain services, etc. - Physician Referral [phone]  Emergency Department Resource Guide 1) Find a Doctor and Pay Out of Pocket Although you won't have to find out who is covered by your insurance plan, it is a good idea to ask around and get recommendations. You will then need to call the office and see if the doctor you have chosen will accept you as a new patient and what types of options they offer for patients who are self-pay. Some doctors offer discounts or will set up payment plans for their patients who do not have insurance, but you will need to ask so you aren't surprised when you get to your appointment.  2) Contact Your Local Health Department Not all  health departments have doctors that can see patients for sick visits, but many do, so it is worth a call to see if yours does. If you don't know where your local health department is, you can check in your phone book. The CDC also has a tool to help you locate your state's health department, and many state websites also have listings of all of their local health departments.  3) Find a Walk-in Clinic If your illness is not likely to be very severe or complicated, you may want to try a walk in clinic. These are popping up  all over the country in pharmacies, drugstores, and shopping centers. They're usually staffed by nurse practitioners or physician assistants that have been trained to treat common illnesses and complaints. They're usually fairly quick and inexpensive. However, if you have serious medical issues or chronic medical problems, these are probably not your best option.  No Primary Care Doctor: - Call Health Connect at  [phone] - they can help you locate a primary care doctor that  accepts your insurance, provides certain services, etc. - Physician Referral Service- [phone]  Chronic Pain Problems: Organization         Address  Phone   Notes  [facility]  [phone] Patients need to be referred by their primary care doctor.   Medication Assistance: Organization         Address  Phone   Notes  [facility] Medication [facility] [address] [place], [place] [address] [phone] --Must be a resident of [place] -- Must have NO insurance coverage whatsoever (no Medicaid/ Medicare, etc.) -- The pt. MUST have a primary care doctor that directs their care regularly and follows them in the community   Med[facility]ist  [phone]   [employer]  [phone]    Agencies that provide inexpensive medical care: Organization         Address  Phone   Notes  [facility]  [phone]   [facility]    [phone]   [facility] [address] [place], [place] [address] [phone]   [facility] [address], [place] [phone]   [facility]    [phone]   [facility]    [phone]   [facility]  [address], [place] Phone:  [phone], Fax:  [phone] Hours of Operation:  9 am - 6 pm, M-F.  Also accepts Medicaid/Medicare and self-pay.  [facility]  [address], [place] Phone: [phone], Fax: [phone]. Hours of Operation:  8:30 am - 5:30 pm, M-F.  Also accepts Medicaid and self-pay.  [facility] [place] [address], [place] Phone: [phone]) [phone]   [facility] [address], [place], [place] [phone], Ext. [phone] Mondays & Thursdays: 7-9 AM.  First 15 patients are seen on a first come, first serve basis.    Medicaid-accepting [facility] Providers:  Organization         Address  Phone   Notes  [facility] [address],  [phone] Also accepts self-pay patients.  [facility] [address], [place]  [phone]   [facility] [address], [place] [phone]   [facility] [address] [place] [phone]   Renaye Rakers [address], [place]   [phone] Only accepts [place] patients after they have their name applied to their card.   Self-Pay (no insurance) in [facility]:  Organization         Address  Phone   Notes  Sickle Cell Patients, [facility] [address] [place], [place] [phone]   [facility] [address] [place], [place] [phone]   [facility]  [address], [place] [phone]   [facility]/Dr. Osei-Bonsu  [address] [place] or [address], [place] [phone] Phone number for both [place] and [place] locations is the same.  Urgent Medical and [facility] [place] [address] [place] [phone]   [facility] [address], [place] or [address] [phone]   [facility] [address], [place] [phone], phone; [phone], fax Sees patients 1st and 3rd Saturday of every  month.  Must not qualify for public or private insurance (i.e. Medicaid, Medicare, [place] Health Choice, Veterans' Benefits)  Household income should be no more than 200% of the poverty level The clinic cannot treat you if you are pregnant or think you are pregnant  Sexually transmitted diseases are not treated at the clinic.

## 2015-08-06 NOTE — ED Provider Notes (Signed)
CSN: [id number]     Arrival date & time 08/06/15  1259 History  By signing my name below, I, Emmanuella Mensah, attest that this documentation has been prepared under the direction and in the presence of Jacques Fife, PA-C. Electronically Signed: Angelene GiovanniEmmanuella Mensah, ED Scribe. 08/06/2015. 5:33 PM.    Chief Complaint  Patient presents with  . Back Pain  . Abdominal Pain   The history is provided by the patient. No language interpreter was used.   HPI Comments: Marissa Owens is a 55 y.o. female with a hx of kidney stones who presents to the Emergency Department complaining of gradually worsening bilateral lower back pain onset yesterday. Pt states that she believes she passed a kidney stone this am. She reports associated nausea onset this am as well as urinary frequency and burning with urination onset one month ago. She states that these symptoms are consistent with when she had her last kidney stone one year ago.   Pt's BP is high today and she states that it has been high for a while. Stats she has increasing blurred vision for approx. 6 months. She adds that she is working on establishing a PCP. She denies any vomiting, eye pain, fever, or chills.   Past Medical History  Diagnosis Date  . COPD (chronic obstructive pulmonary disease) (HCC)   . Renal disorder     kidney stone   Past Surgical History  Procedure Laterality Date  . Tubal ligation     No family history on file. Social History  Substance Use Topics  . Smoking status: Current Every Day Smoker  . Smokeless tobacco: None  . Alcohol Use: Yes   OB History    No data available     Review of Systems  Constitutional: Negative for fever and chills.  Eyes: Negative for pain.  Gastrointestinal: Positive for nausea. Negative for vomiting.  Genitourinary: Positive for dysuria.  Musculoskeletal: Positive for back pain.  All other systems reviewed and are negative.     Allergies  Review of patient's allergies indicates  no known allergies.  Home Medications   Prior to Admission medications   Medication Sig Start Date End Date Taking? Authorizing Provider  Lactulose 20 GM/30ML SOLN Take 30 mLs (20 g total) by mouth 3 (three) times daily with meals as needed (Constipation). Patient not taking: Reported on 05/06/2014 01/30/14   Mancel BaleElliott Wentz, MD  magnesium hydroxide (MILK OF MAGNESIA) 400 MG/5ML suspension Take 30 mLs by mouth daily as needed for mild constipation.    Historical Provider, MD  ondansetron (ZOFRAN) 4 MG tablet Take 1 tablet (4 mg total) by mouth every 6 (six) hours. 05/06/14   Ladona MowJoe Mintz, PA-C  oxyCODONE-acetaminophen (PERCOCET) 5-325 MG per tablet Take 1-2 tablets by mouth every 6 (six) hours as needed. 05/06/14   Ladona MowJoe Mintz, PA-C   BP 176/121 mmHg  Pulse 65  Temp(Src) 98.6 F (37 C) (Oral)  Resp 18  Ht 5\' 2"  (1.575 m)  Wt 129 lb 9.6 oz (58.786 kg)  BMI 23.70 kg/m2  SpO2 97% Physical Exam  Constitutional: She is oriented to person, place, and time. She appears well-developed and well-nourished.  HENT:  Head: Normocephalic and atraumatic.  Right Ear: External ear normal.  Left Ear: External ear normal.  Nose: Nose normal.  Mouth/Throat: No oropharyngeal exudate.  Eyes: Conjunctivae and EOM are normal. Pupils are equal, round, and reactive to light.  No papilledema appreciable   Cardiovascular: Normal rate.   Pulmonary/Chest: Effort normal and breath sounds  normal. No respiratory distress. She has no wheezes. She exhibits no tenderness.  Abdominal: Soft. Bowel sounds are normal. She exhibits no distension. There is no tenderness.  No CVA tenderness  Neurological: She is alert and oriented to person, place, and time.  Skin: Skin is warm and dry.  Psychiatric: She has a normal mood and affect.  Nursing note and vitals reviewed.   ED Course  Procedures (including critical care time) DIAGNOSTIC STUDIES: Oxygen Saturation is 97% on RA, normal by my interpretation.    COORDINATION OF  CARE: 5:28 PM- Pt advised of plan for treatment and pt agrees. Pt was informed of lab results. Pt will receive Toradol, tylenol, and Norvasc.    Labs Review Labs Reviewed  COMPREHENSIVE METABOLIC PANEL - Abnormal; Notable for the following:    AST 45 (*)    All other components within normal limits  URINALYSIS, ROUTINE W REFLEX MICROSCOPIC (NOT AT [facility]) - Abnormal; Notable for the following:    Hgb urine dipstick TRACE (*)    Leukocytes, UA TRACE (*)    All other components within normal limits  URINE MICROSCOPIC-ADD ON - Abnormal; Notable for the following:    Squamous Epithelial / LPF 0-5 (*)    Bacteria, UA RARE (*)    All other components within normal limits  URINE CULTURE  LIPASE, BLOOD  CBC  I-STAT BETA HCG BLOOD, ED ([facility], [facility], [facility] ONLY)    Noelle Penner, PA-C has personally reviewed and evaluated these images and lab results as part of her medical decision-making.  MDM   Final diagnoses:  History of kidney stones  Dysuria  Lower abdominal pain  Essential hypertension    Pt states she passed a kidney stone this AM. She is able to urinate. Her UA is not very impressive with trace leuks and rare bacteria. However, given one month history of dysuria and increased urinary frequency, will cover for infection though these symptoms could also have been related to kidney stone. Will hold off on CT renal today. Kidney function normal on  CMP. Will send urine culture and refer to urology for f/u.  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 Kidney function is WNL though she does have blurred vision over the past several months. Her eye exam is unremarkable today. Will start on Norvasc. Will give resource guide to establish PCP for f/u as well as refer to ophtho.   I personally performed the services described in this documentation, which was scribed in my presence. The recorded information has been reviewed and is  accurate.   Carlene Coria, PA-C 08/06/15 2216  Rolan Bucco, MD 08/06/15 [phone]

## 2015-08-08 LAB — URINE CULTURE

## 2015-11-12 ENCOUNTER — Ambulatory Visit (INDEPENDENT_AMBULATORY_CARE_PROVIDER_SITE_OTHER)
Admission: EM | Admit: 2015-11-12 | Discharge: 2015-11-12 | Disposition: A | Discharge status: Home / Self Care | Payer: BLUE CROSS/BLUE SHIELD | Source: Home / Self Care | Attending: Emergency Medicine | Admitting: Emergency Medicine

## 2015-11-12 ENCOUNTER — Emergency Department (HOSPITAL_COMMUNITY)
Admission: EM | Admit: 2015-11-12 | Discharge: 2015-11-12 | Disposition: A | Discharge status: Home / Self Care | Payer: BLUE CROSS/BLUE SHIELD | Attending: Emergency Medicine | Admitting: Emergency Medicine

## 2015-11-12 ENCOUNTER — Encounter (HOSPITAL_COMMUNITY): Payer: Self-pay | Admitting: Emergency Medicine

## 2015-11-12 ENCOUNTER — Encounter (HOSPITAL_COMMUNITY): Payer: Self-pay

## 2015-11-12 DIAGNOSIS — F418 Other specified anxiety disorders: Secondary | ICD-10-CM | POA: Diagnosis not present

## 2015-11-12 DIAGNOSIS — R630 Anorexia: Secondary | ICD-10-CM

## 2015-11-12 DIAGNOSIS — F329 Major depressive disorder, single episode, unspecified: Secondary | ICD-10-CM

## 2015-11-12 DIAGNOSIS — Z7951 Long term (current) use of inhaled steroids: Secondary | ICD-10-CM | POA: Diagnosis not present

## 2015-11-12 DIAGNOSIS — I10 Essential (primary) hypertension: Secondary | ICD-10-CM | POA: Diagnosis not present

## 2015-11-12 DIAGNOSIS — R110 Nausea: Secondary | ICD-10-CM

## 2015-11-12 DIAGNOSIS — F32A Depression, unspecified: Secondary | ICD-10-CM

## 2015-11-12 DIAGNOSIS — J449 Chronic obstructive pulmonary disease, unspecified: Secondary | ICD-10-CM | POA: Insufficient documentation

## 2015-11-12 DIAGNOSIS — F17200 Nicotine dependence, unspecified, uncomplicated: Secondary | ICD-10-CM | POA: Insufficient documentation

## 2015-11-12 LAB — URINALYSIS, ROUTINE W REFLEX MICROSCOPIC
Bilirubin Urine: NEGATIVE
GLUCOSE, UA: NEGATIVE mg/dL
Hgb urine dipstick: NEGATIVE
Ketones, ur: NEGATIVE mg/dL
LEUKOCYTES UA: NEGATIVE
NITRITE: NEGATIVE
PH: 6 (ref 5.0–8.0)
Protein, ur: NEGATIVE mg/dL
Specific Gravity, Urine: 1.024 (ref 1.005–1.030)

## 2015-11-12 LAB — COMPREHENSIVE METABOLIC PANEL
ALT: 24 U/L (ref 14–54)
ANION GAP: 5 (ref 5–15)
AST: 35 U/L (ref 15–41)
Albumin: 3.7 g/dL (ref 3.5–5.0)
Alkaline Phosphatase: 76 U/L (ref 38–126)
BUN: 13 mg/dL (ref 6–20)
CHLORIDE: 106 mmol/L (ref 101–111)
CO2: 29 mmol/L (ref 22–32)
Calcium: 9 mg/dL (ref 8.9–10.3)
Creatinine, Ser: 0.9 mg/dL (ref 0.44–1.00)
Glucose, Bld: 84 mg/dL (ref 65–99)
POTASSIUM: 3.8 mmol/L (ref 3.5–5.1)
Sodium: 140 mmol/L (ref 135–145)
Total Bilirubin: 1.1 mg/dL (ref 0.3–1.2)
Total Protein: 6.6 g/dL (ref 6.5–8.1)

## 2015-11-12 LAB — CBC
HEMATOCRIT: 44.9 % (ref 36.0–46.0)
HEMOGLOBIN: 15.5 g/dL — AB (ref 12.0–15.0)
MCH: 31.4 pg (ref 26.0–34.0)
MCHC: 34.5 g/dL (ref 30.0–36.0)
MCV: 90.9 fL (ref 78.0–100.0)
Platelets: 220 10*3/uL (ref 150–400)
RBC: 4.94 MIL/uL (ref 3.87–5.11)
RDW: 12.8 % (ref 11.5–15.5)
WBC: 7.9 10*3/uL (ref 4.0–10.5)

## 2015-11-12 LAB — RAPID URINE DRUG SCREEN, HOSP PERFORMED
Amphetamines: NOT DETECTED
BARBITURATES: NOT DETECTED
BENZODIAZEPINES: NOT DETECTED
COCAINE: NOT DETECTED
OPIATES: NOT DETECTED
TETRAHYDROCANNABINOL: NOT DETECTED

## 2015-11-12 LAB — ETHANOL: Alcohol, Ethyl (B): <5 mg/dL (ref <5)

## 2015-11-12 MED ORDER — ACETAMINOPHEN 325 MG PO TABS: 650.0000 mg | ORAL_TABLET | ORAL | Status: DC | PRN

## 2015-11-12 MED ORDER — ONDANSETRON HCL 4 MG PO TABS: 4.0000 mg | ORAL_TABLET | Freq: Three times a day (TID) | ORAL | Status: DC | PRN

## 2015-11-12 MED ORDER — HYDROCHLOROTHIAZIDE 25 MG PO TABS: 25.0000 mg | ORAL_TABLET | Freq: Every day | ORAL | Status: DC

## 2015-11-12 MED ORDER — LORAZEPAM 1 MG PO TABS
1.0000 mg | ORAL_TABLET | Freq: Once | ORAL | Status: AC
  Administered 2015-11-12: 1 mg via ORAL
  Filled 2015-11-12: qty 1

## 2015-11-12 MED ORDER — LORAZEPAM 1 MG PO TABS: 1.0000 mg | ORAL_TABLET | Freq: Three times a day (TID) | ORAL | Status: DC | PRN

## 2015-11-12 MED ORDER — ONDANSETRON HCL 4 MG PO TABS: 4.0000 mg | ORAL_TABLET | Freq: Four times a day (QID) | ORAL | Status: DC

## 2015-11-12 NOTE — Discharge Instructions (Signed)
You are being started on a blood pressure medication and nausea medication.  Please be sure to establish care with a primary care provider who can monitor your symptoms and change or continue medications as indicated.  Emergency Department Resource Guide 1) Find a Doctor and Pay Out of Pocket Although you won't have to find out who is covered by your insurance plan, it is a good idea to ask around and get recommendations. You will then need to call the office and see if the doctor you have chosen will accept you as a new patient and what types of options they offer for patients who are self-pay. Some doctors offer discounts or will set up payment plans for their patients who do not have insurance, but you will need to ask so you aren't surprised when you get to your appointment.  2) Contact Your Local Health Department Not all health departments have doctors that can see patients for sick visits, but many do, so it is worth a call to see if yours does. If you don't know where your local health department is, you can check in your phone book. The CDC also has a tool to help you locate your state's health department, and many state websites also have listings of all of their local health departments.  3) Find a Walk-in Clinic If your illness is not likely to be very severe or complicated, you may want to try a walk in clinic. These are popping up all over the country in pharmacies, drugstores, and shopping centers. They're usually staffed by nurse practitioners or physician assistants that have been trained to treat common illnesses and complaints. They're usually fairly quick and inexpensive. However, if you have serious medical issues or chronic medical problems, these are probably not your best option.  No Primary Care Doctor: - Call Health Connect at  [phone] - they can help you locate a primary care doctor that  accepts your insurance, provides certain services, etc. - Physician Referral Service-  [phone]  Chronic Pain Problems: Organization         Address  Phone   Notes  [facility]  [phone] Patients need to be referred by their primary care doctor.   Medication Assistance: Organization         Address  Phone   Notes  [facility]ication [facility] [address] [place], [place] [phone] --Must be a resident of [facility] [place] -- Must have NO insurance coverage whatsoever (no Medicaid/ Medicare, etc.) -- The pt. MUST have a primary care doctor that directs their care regularly and follows them in the community   MedAssist  [phone]   [employer]  [phone]    Agencies that provide inexpensive medical care: Buyer, retailrganization         Address                                                       Phone  Notes  [place]  [phone]   [facility]    [phone]   [facility], [place] [address] [phone]   [place] [address], [place] [phone]   Planned Parenthood    [phone]   [facility]    [phone]   [place] and [place] [address], [place] Phone:  [phone], Fax:  [phone] Hours of Operation:  9 am - 6 pm, M-F.  Also accepts Medicaid/Medicare and self-pay.  [facility] for [place], [address], [place] Phone: [phone], Fax: [phone]. Hours of Operation:  8:30 am - 5:30 pm, M-F.  Also accepts Medicaid and self-pay.  [facility] [place] [address] [place] Phone: [phone]   [place], [place], [place] [phone], Ext. [phone] Mondays & Thursdays: 7-9 AM.  First 15 patients are seen on a first come, first serve basis.    [place] Providers:  Organization         Address                                                                       Phone                               Notes  [facility] [address], [place] [phone] Also accepts self-pay patients.  [facility] [address], [place]  [phone]   [place], [address], [place] [phone]   [facility] [address] [place] [phone]   Lucianne Lei [address], [place]   [phone] Only accepts [place] Access [place] patients after they have their name applied to their card.   Self-Pay (no insurance) in [facility]:   Organization         Address                                                     Phone               Notes  Sickle Cell Patients, [facility] [place] [phone]   [facility] [place] [phone]   [facility] Pontes [facility] Axis  [place], [place], [place] [phone]   [facility]/Dr. Osei-Bonsu  [address], [place] or [address], [place] [phone] Phone number for both [place] and [place] locations is the same.  Urgent Medical and [facility] [address], [place] [phone]   [facility] [address], [place] or [address] [phone]   [place]  [facility] [place] [phone], phone; [phone], fax Sees patients 1st and 3rd Saturday of every month.  Must not qualify for public or private insurance (i.e. Medicaid, Medicare, [place] Health Choice, Veterans' Benefits)  Household income should be no more than 200% of the poverty level The clinic cannot treat you if you are pregnant or think you are pregnant  Sexually  transmitted diseases are not treated at the clinic.    Dental Care: Organization         Address                                  Phone                       Notes  [facility] Department of [facility] [place] [phone] Accepts children up to age 54 who are enrolled in [place] or [place]; pregnant women with a Medicaid card; and children who have applied for Medicaid or [place] Health Choice, but were declined, whose parents can pay a reduced fee at time of service.  [facility]  [address], [place] [phone] Accepts children up to age 43 who are enrolled in [place] or [place]; pregnant women with a Medicaid card; and children who have applied for Medicaid or [place] Health Choice, but were declined, whose parents can pay a reduced fee at time of service.  [facility] Adult [facility] Access PROGRAM  [place] [phone] Patients are seen by appointment only. Walk-ins are not accepted. [place] will see patients 34 years of age and older. Monday - Tuesday (8am-5pm) Most Wednesdays (8:30-5pm) $30 per visit, cash only  [facility] Adult Dental Access PROGRAM  [address], [facility] [phone] Patients are seen by appointment only. Walk-ins are not accepted. [place] will see patients 47 years of age and older. One Wednesday Evening (Monthly: Volunteer Based).  $30 per visit, cash only  [place]  [phone] for adults; Children under age 51, call Graduate Pediatric Dentistry at [phone]. Children aged 30-14, please call [phone] to request a pediatric application.  Dental services are provided in all areas of dental care including fillings, crowns and bridges, complete and partial dentures, implants, gum treatment, root canals, and extractions. Preventive care is also provided. Treatment is  provided to both adults and children. Patients are selected via a lottery and there is often a waiting list.   [facility] [address] [place]  [phone] [email or web address]   [facility] [address] [place], [place] [phone], Ext. [phone] Second and Fourth Thursday of each month, opens at 6:30 AM; Clinic ends at 9 AM.  Patients are seen on a first-come first-served basis, and a limited number are seen during each clinic.   [facility]  [address] Hillard Danker [place], [place] [phone]   Eligibility Requirements You must have lived in [place], [place], or [place] counties for at least the last three months.   You cannot be eligible for state or federal sponsored [employer], including [employer], [place], or [employer].   You generally cannot be eligible for healthcare insurance through your employer.    How to apply: Eligibility screenings are held every  Tuesday and Wednesday afternoon from 1:00 pm until 4:00 pm. You do not need an appointment for the interview!  [facility] [address], [place], [place]   [place]  [place] Department  [place]  [phone]    Behavioral Health Resources in the Community: Intensive Outpatient Programs Organization         Address                                              Phone              Notes  [place]. [address], [place], [place] [phone]   [facility] Outpatient [address], [place], [place]   ADS: Alcohol & Drug Svcs [address] [place], [place]   [place] [address],  [place], [place] or [phone]   Substance Abuse Resources Organization         Address                                Phone  Notes  Alcohol and Drug  Services  [phone]   [place]  [phone]   The [place]   Chinita Pester  [phone]   Residential & Outpatient Substance Abuse Program  [phone]   Psychological Services Organization         Address                                  Phone                Notes  [facility] [place]  [place]  [phone]   [place] [address] [place] or [phone]    Mobile Crisis Teams Organization         Address  Phone  Notes  Therapeutic Alternatives, Mobile Crisis Care Unit  [phone]   Assertive Psychotherapeutic Services  [address] [place], [place]   [place] [address] [place] [phone]    Self-Help/Support Groups Organization         Address                         Phone             Notes  [place]. of [place] - variety of support groups  [place] Call for more information  Narcotics Anonymous (NA), Caring Services [address], [employer] [place]  2 meetings at this location   Special educational needs teacher         Address                                                    Phone              Notes  ASAP Residential Treatment [phone]  [address]    [place]  [phone]   The [facility]  [address] [place] [address], [place], [place]   [place], [place] [phone] Admissions: 8am-3pm M-F  Incentives Substance [place] [address],    [place], [place] [phone]   [facility] [address] [place], [place], [place]   [facility] [address]  [place], [place]   Insight Programs - Intensive Outpatient [address] Kristeen Mans [address], [place], [place]   [facility] ([place].) [place].,  [place], [place] [phone] or  [phone]   Residential Treatment Services (RTS) [address] [place], [place] Accepts Medicaid  [facility]ellowship [place] [address],  [place] [phone] Substance Abuse/Addiction Treatment   [facility] Organization         Address                                                            Phone                    Notes  [facility]  [phone]   Domenic Schwab, PhD [address] Arlis Porta [place], [place]   [phone] or [phone]   Quentin Yeoman [place], [place] [phone]   [facility] [address], [place], [place] [phone] Insurance/Medicaid/sponsorship through [facility] and Families [address] Ste [place]                                    [place], [place] [phone] Dent [address], [place] [phone]    Dr. Adele Schilder  [phone]   [facility] [place] Dept. 1) [address], [place] 2) [place] 3)  [address], [place] [phone]  [phone]   [place] [phone] or [phone] (After Hours)

## 2015-11-12 NOTE — Discharge Instructions (Signed)
[facility]                  Crisis Services      [phone] N[address]     [place], [place] [address]   Family Services of the [place] sliding scale fee and walk in schedule: M-F 8am-12pm/1pm-3pm [address]  [place], [place] [address] [phone]     Intensive Outpatient Programs [facility]     [address] [place]      [place], [place]                   [phone]       [facility] [address] [place] [address] [place], [place] [phone]  [facility] Outpatient     (Inpatient and outpatient)     [address]           [phone]    [facility] [phone] (Suboxone and Methadone)  [address]      [place], [place] [address]      [phone]       [address] [place], [place] [phone]  Fellowship Margo Aye (Outpatient/Inpatient, Chemical)    (insurance only) [phone]             Caring Services (Groups & Residential) [place], [place] [phone]     [facility]     [address]     [place], [place]      [phone]       [facility] (for caregivers and family) [phone] Laurell Josephs. [address] [place], [place] [phone]      Residential Treatment Programs [facility]      [address] [place], [place] [address]  [phone]       [facility] [address] [place], [place] [address] [phone]  Path of [place]        [phone]       Fellowship Margo Aye [phone]  [facility] (Addiction Recovery Care Assoc.)             [address]                                         [place], [place]                                                [phone] or [phone]                               [facility] [place] [address] [place], [address] [phone]  [facility]    [address]      [place], [place]     [phone]       [facility] [address] [place], [place] [phone]  [facility]   [address] [place], [place] [address]     [phone]      Admissions: 8am-3pm M-F  Residential Treatment Services (RTS) [address] [place], [place] [phone]  BATS Program: Residential Program [phone] Days)   Marcy Panning, [place]  [phone] or [phone]     ADATC: [facility] [place], [place] (Walk in Hours over the weekend or by referral)  [facility] [address] [place], [place], [place] [address] [phone]  Crisis Mobile: Therapeutic Alternatives:  [phone] (for crisis response 24 hours a day) [facility] Hotline:      [phone] Outpatient Psychiatry and Counseling  Therapeutic Alternatives: Mobile Crisis Management 24 hours:  [phone]  [facility] of the [employer] sliding scale fee and walk in schedule: M-F 8am-12pm/1pm-3pm [address]  [place], [place] [address] [phone]  [facility] [address] [place], [place] [address] [phone]  [facility] (Formerly known as The [employer])- new patient walk-in appointments available Monday - Friday 8am -3pm.          [address] [place], [place] [address] [phone] or crisis line- [phone]  [facility] Outpatient Services/ Intensive Outpatient Therapy Program [address] [place], [place] [address] [phone]  [facility] Mental Health                  Crisis Services      [phone]     [place], [place] [address]                 [facility]   Care One [phone]. [address] [place], [place] [address]   [employer] of Care          [address] Bea Laura  [place], [place] [address]       [phone]  [facility] [address] [place], [place] [address] [phone]  [facility]    [address]    [place], [place] [address]     [phone]       Andee Poles, MD     [address]     [place] [address]     [phone]       [facility] [address] [place] [address]  [facility]     [address] [place], [place]      [phone]       [facility] Eulogio Ditch, MD [address] [place], [place] [address] [phone]  [facility]     [address]     [place], [place] [address]     [phone]       [facility] [address] [place], [place] [address] [phone] Resources for Temporary Residential Assistance/Crisis Centers  DAY CENTERS Interactive Resource Center [facility]) M-F 8am-3pm   [address] [place], [place] [address]   [phone] Services include: laundry, barbering, support groups, case management, phone  & computer access, showers, AA/NA mtgs, mental health/substance abuse nurse, job skills class, disability information, VA assistance, spiritual classes, etc.   HOMELESS SHELTERS  [facility]     [facility]   [address], [place]     [phone]              [employer] (women and children)       [address] [place], [place] [address] [phone] [email or web address] for application and process Application Required  [facility]   [address]    [place] [address]  [phone]                    [facility] [address] [place], [place] [address] [phone](schedule application appt.) Application Required  [facility] (women only)    [address]     [place], [place] [address]     [phone]      Intake starts 6pm daily Need valid ID, SSC, & Police report [facility] An[facility] [place] [address] [place], [place] [phone] Application  Required  [facility] (men only)     [address]      [place], [place]     [phone]       [facility] [place] (Pregnant women only) [address] [place], [place] [phone]  [facility]      [address]      [place], [place] [address]     [phone]             [facility] [address] [place], [place] [phone] 90 day commitment/SA/Application process  [facility](men only)     [address]     [place], [place]     [phone]       Check-in at [facility]7pm            Crisis Ministry of [facility] [address] [place], [place] [phone] Men/Women/Women and Children must be there by 7 pm  [facility] [place], [place] [phone]

## 2015-11-12 NOTE — ED Notes (Signed)
Pt has been depressed for over a year.  Pt has been in a relationship which makes her feel less of a person.  She has not been eating and has lost weight.  Pt also having anxiety attacks at work.

## 2015-11-12 NOTE — ED Notes (Signed)
The patient [facility] with a complaint of weight loss and depression symptoms x 2 months. She stated that she has been struggling with depression for 2 years but it has gotten worse recently. The patient stated that she was given samples of HTN meds but she has not had a prescription.

## 2015-11-12 NOTE — ED Provider Notes (Signed)
CSN: [id number]     Arrival date & time 11/12/15  0802 History   First MD Initiated Contact with Patient 11/12/15 [phone]     Chief Complaint  Patient presents with  . Depression  . Anxiety    Patient is a 55 y.o. female presenting with depression. The history is provided by the patient.  Depression This is a new problem. The current episode started more than 1 week ago. The problem occurs daily. The problem has been gradually worsening. Associated symptoms include abdominal pain. Pertinent negatives include no chest pain. Nothing relieves the symptoms. She has tried nothing for the symptoms.   Patent with h/o HTN/COPD who presents with ongoing symptoms of depression for a year She reports frequent panic attacks She reports decreased appetite She reports weight loss Her symptoms are affecting her job and was advised to be evaluated by her supervisor   She reports nausea and abdominal pain for one month Denies current drug use Past Medical History  Diagnosis Date  . COPD (chronic obstructive pulmonary disease) (HCC)   . Renal disorder     kidney stone   Past Surgical History  Procedure Laterality Date  . Tubal ligation     History reviewed. No pertinent family history. Social History  Substance Use Topics  . Smoking status: Current Every Day Smoker  . Smokeless tobacco: None  . Alcohol Use: Yes   OB History    No data available     Review of Systems  Constitutional: Positive for appetite change, fatigue and unexpected weight change. Negative for fever.  Cardiovascular: Negative for chest pain.  Gastrointestinal: Positive for nausea and abdominal pain.  Psychiatric/Behavioral: Positive for depression. Negative for suicidal ideas. The patient is nervous/anxious.   All other systems reviewed and are negative.     Allergies  Review of patient's allergies indicates no known allergies.  Home Medications   Prior to Admission medications   Medication Sig Start Date End  Date Taking? Authorizing Provider  albuterol (PROVENTIL HFA;VENTOLIN HFA) 108 (90 Base) MCG/ACT inhaler Inhale 1-2 puffs into the lungs every 6 (six) hours as needed for wheezing or shortness of breath.   Yes Historical Provider, MD  MACA ROOT PO Take 1 tablet by mouth daily.   Yes Historical Provider, MD  Melatonin 10 MG TABS Take 10 mg by mouth at bedtime.   Yes Historical Provider, MD  Phenyleph-Doxylamine-DM-APAP (ALKA-SELTZER PLS ALLERGY & CGH) 5-6.25-10-325 MG CAPS Take 2 capsules by mouth 2 (two) times daily as needed (for cold).   Yes Historical Provider, MD   BP 192/107 mmHg  Pulse 72  Temp(Src) 98 F (36.7 C) (Oral)  Resp 16  SpO2 98% Physical Exam CONSTITUTIONAL: Well developed/well nourished HEAD: Normocephalic/atraumatic EYES: EOMI ENMT: Mucous membranes moist NECK: supple no meningeal signs CV: S1/S2 noted, no murmurs/rubs/gallops noted LUNGS: Lungs are clear to auscultation bilaterally, no apparent distress ABDOMEN: soft, nontender NEURO: Pt is awake/alert/appropriate, moves all extremitiesx4.  No facial droop.   EXTREMITIES: pulses normal/equal, full ROM SKIN: warm, color normal PSYCH: poor eye contact, mildly anxious   ED Course  Procedures (including critical care time) Labs Review Labs Reviewed  CBC - Abnormal; Notable for the following:    Hemoglobin 15.5 (*)    All other components within normal limits  COMPREHENSIVE METABOLIC PANEL  URINALYSIS, ROUTINE W REFLEX MICROSCOPIC (NOT AT [facility])  ETHANOL  URINE RAPID DRUG SCREEN, HOSP PERFORMED   I have personally reviewed and evaluated these  lab results as part of my medical  decision-making.  9:56 AM Pt with severe depression She denies SI but is making poor eye contact, and she reports weight loss and it is affecting her job performance I feel behavioral health consult is warranted and may benefit from admission as outpatient f/u options appear limited 10:17 AM Pt seen by TTS She decline admission She  denies SI She does report she will go [facility] [facility] Resources given Discussed return precautions   MDM   Final diagnoses:  Depression    Nursing notes including past medical history and social history reviewed and considered in documentation Labs/vital reviewed myself and considered during evaluation     Zadie Rhine, MD 11/12/15 1018

## 2015-11-12 NOTE — ED Notes (Signed)
Pt aware bp elevated throughout visit and to follow up with a primary care for bp control.
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
## 2015-11-12 NOTE — ED Provider Notes (Signed)
CSN: [id number]     Arrival date & time 11/12/15  1257 History   First MD Initiated Contact with Patient 11/12/15 1312     Chief Complaint  Patient presents with  . Weight Loss  . Depression   (Consider location/radiation/quality/duration/timing/severity/associated sxs/prior Treatment) HPI  Marissa Owens is a 55 y.o. female presenting to UC with c/o depression with associated loss of appetite and weight loss over the last 2 months. Per medical records, pt was see earlier this morning for similar complaints, psych admission was recommended, however, pt declined and proceeded to go to [facility].  Pt states she did go to [facility] but they are unable to help her until she fills out a packet of information. She does not have a f/u appointment yet. Pt requesting medication to help with her appetite. She does not have a PCP. Denies abdominal pain, chest pain or SOB at this time. Denies SI/HI.    Pt noted to have elevated BP in triage. Pt has been told in the past she has HTN but was only given a sample of BP medication in the past, does not recall name of medication.     Past Medical History  Diagnosis Date  . COPD (chronic obstructive pulmonary disease) (HCC)   . Renal disorder     kidney stone   Past Surgical History  Procedure Laterality Date  . Tubal ligation     History reviewed. No pertinent family history. Social History  Substance Use Topics  . Smoking status: Current Every Day Smoker  . Smokeless tobacco: None  . Alcohol Use: Yes   OB History    No data available     Review of Systems  Constitutional: Positive for unexpected weight change. Negative for fatigue.  Respiratory: Negative for chest tightness and shortness of breath.   Cardiovascular: Negative for chest pain and palpitations.  Gastrointestinal: Positive for nausea. Negative for vomiting, abdominal pain, diarrhea, constipation and blood in stool.  Neurological: Negative for dizziness, syncope, weakness,  light-headedness and headaches.  Psychiatric/Behavioral: Positive for dysphoric mood. Negative for suicidal ideas and self-injury.    Allergies  Review of patient's allergies indicates no known allergies.  Home Medications   Prior to Admission medications   Medication Sig Start Date End Date Taking? Authorizing Provider  Melatonin 10 MG TABS Take 10 mg by mouth at bedtime.   Yes Historical Provider, MD  albuterol (PROVENTIL HFA;VENTOLIN HFA) 108 (90 Base) MCG/ACT inhaler Inhale 1-2 puffs into the lungs every 6 (six) hours as needed for wheezing or shortness of breath.    Historical Provider, MD  hydrochlorothiazide (HYDRODIURIL) 25 MG tablet Take 1 tablet (25 mg total) by mouth daily. 11/12/15   Junius FinnerErin O'Malley, PA-C  MACA ROOT PO Take 1 tablet by mouth daily.    Historical Provider, MD  ondansetron (ZOFRAN) 4 MG tablet Take 1 tablet (4 mg total) by mouth every 6 (six) hours. 11/12/15   Junius FinnerErin O'Malley, PA-C  Phenyleph-Doxylamine-DM-APAP (ALKA-SELTZER PLS ALLERGY & [facility]) 5-6.25-10-325 MG CAPS Take 2 capsules by mouth 2 (two) times daily as needed (for cold).    Historical Provider, MD   Meds Ordered and Administered this Visit  Medications - No data to display  BP 160/104 mmHg  Pulse 78  Temp(Src) 98.5 F (36.9 C) (Oral)  Resp 18  SpO2 100% No data found.   Physical Exam  Constitutional: She is oriented to person, place, and time. She appears well-developed and well-nourished.  HENT:  Head: Normocephalic and atraumatic.  Eyes: EOM  are normal.  Neck: Normal range of motion.  Cardiovascular: Normal rate, regular rhythm and normal heart sounds.   Pulmonary/Chest: Effort normal and breath sounds normal. No respiratory distress. She has no wheezes. She has no rales.  Abdominal: Soft. Bowel sounds are normal. She exhibits no distension and no mass. There is no tenderness. There is no rebound and no guarding.  Musculoskeletal: Normal range of motion.  Neurological: She is alert and oriented  to person, place, and time.  Skin: Skin is warm and dry.  Psychiatric: Her speech is normal and behavior is normal. She exhibits a depressed mood ( flat affect).  Nursing note and vitals reviewed.   ED Course  Procedures (including critical care time)  Labs Review Labs Reviewed - No data to display  Imaging Review No results found.    MDM   1. Depression   2. Loss of appetite   3. Nausea   4. Essential hypertension    Pt c/o depression and loss of appetite. Pt does appear depressed. Elevated BP w/o emergent symptoms.  Encouraged pt to f/u with [facility] based on information packet provided to her earlier today.    Rx: zofran and HCTZ (2 week supply)  [facility] with a PCP for monitoring of symptoms and BP. Resource guide provided. Patient verbalized understanding and agreement with treatment plan.    Junius Finner, PA-C 11/12/15 1525

## 2016-05-29 ENCOUNTER — Other Ambulatory Visit: Payer: Self-pay | Admitting: Family Medicine

## 2016-05-29 DIAGNOSIS — Z1231 Encounter for screening mammogram for malignant neoplasm of breast: Secondary | ICD-10-CM

## 2016-06-10 ENCOUNTER — Ambulatory Visit: Payer: Self-pay | Admitting: Family Medicine

## 2016-06-19 ENCOUNTER — Ambulatory Visit: Payer: Self-pay

## 2017-03-06 ENCOUNTER — Emergency Department (HOSPITAL_COMMUNITY)
Admission: EM | Admit: 2017-03-06 | Discharge: 2017-03-06 | Disposition: A | Discharge status: Home / Self Care | Payer: BLUE CROSS/BLUE SHIELD | Attending: Emergency Medicine | Admitting: Emergency Medicine

## 2017-03-06 ENCOUNTER — Encounter (HOSPITAL_COMMUNITY): Payer: Self-pay | Admitting: Emergency Medicine

## 2017-03-06 DIAGNOSIS — J449 Chronic obstructive pulmonary disease, unspecified: Secondary | ICD-10-CM | POA: Insufficient documentation

## 2017-03-06 DIAGNOSIS — Z760 Encounter for issue of repeat prescription: Secondary | ICD-10-CM

## 2017-03-06 DIAGNOSIS — F17210 Nicotine dependence, cigarettes, uncomplicated: Secondary | ICD-10-CM | POA: Insufficient documentation

## 2017-03-06 DIAGNOSIS — Z79899 Other long term (current) drug therapy: Secondary | ICD-10-CM | POA: Insufficient documentation

## 2017-03-06 DIAGNOSIS — R062 Wheezing: Secondary | ICD-10-CM | POA: Insufficient documentation

## 2017-03-06 DIAGNOSIS — I10 Essential (primary) hypertension: Secondary | ICD-10-CM | POA: Insufficient documentation

## 2017-03-06 HISTORY — DX: Essential (primary) hypertension: I10

## 2017-03-06 LAB — URINALYSIS, ROUTINE W REFLEX MICROSCOPIC
BACTERIA UA: NONE SEEN
Bilirubin Urine: NEGATIVE
Glucose, UA: NEGATIVE mg/dL
KETONES UR: NEGATIVE mg/dL
LEUKOCYTES UA: NEGATIVE
NITRITE: NEGATIVE
PROTEIN: NEGATIVE mg/dL
Specific Gravity, Urine: 1.01 (ref 1.005–1.030)
pH: 7 (ref 5.0–8.0)

## 2017-03-06 LAB — BASIC METABOLIC PANEL
ANION GAP: 9 (ref 5–15)
BUN: 13 mg/dL (ref 6–20)
CALCIUM: 9.2 mg/dL (ref 8.9–10.3)
CO2: 25 mmol/L (ref 22–32)
CREATININE: 0.75 mg/dL (ref 0.44–1.00)
Chloride: 101 mmol/L (ref 101–111)
GLUCOSE: 119 mg/dL — AB (ref 65–99)
Potassium: 3.8 mmol/L (ref 3.5–5.1)
Sodium: 135 mmol/L (ref 135–145)

## 2017-03-06 LAB — CBC
HCT: 43.8 % (ref 36.0–46.0)
HEMOGLOBIN: 15 g/dL (ref 12.0–15.0)
MCH: 32.5 pg (ref 26.0–34.0)
MCHC: 34.2 g/dL (ref 30.0–36.0)
MCV: 94.8 fL (ref 78.0–100.0)
PLATELETS: 202 10*3/uL (ref 150–400)
RBC: 4.62 MIL/uL (ref 3.87–5.11)
RDW: 13.1 % (ref 11.5–15.5)
WBC: 7.4 10*3/uL (ref 4.0–10.5)

## 2017-03-06 MED ORDER — IPRATROPIUM-ALBUTEROL 0.5-2.5 (3) MG/3ML IN SOLN
3.0000 mL | Freq: Once | RESPIRATORY_TRACT | Status: AC
  Administered 2017-03-06: 3 mL via RESPIRATORY_TRACT
  Filled 2017-03-06: qty 3

## 2017-03-06 MED ORDER — ACETAMINOPHEN 325 MG PO TABS
650.0000 mg | ORAL_TABLET | Freq: Once | ORAL | Status: AC
  Administered 2017-03-06: 650 mg via ORAL
  Filled 2017-03-06: qty 2

## 2017-03-06 MED ORDER — AMLODIPINE BESYLATE 5 MG PO TABS: 5.0000 mg | ORAL_TABLET | Freq: Every day | ORAL | 1 refills | Status: DC

## 2017-03-06 MED ORDER — AMLODIPINE BESYLATE 5 MG PO TABS
5.0000 mg | ORAL_TABLET | Freq: Once | ORAL | Status: AC
  Administered 2017-03-06: 5 mg via ORAL
  Filled 2017-03-06: qty 1

## 2017-03-06 NOTE — ED Triage Notes (Signed)
Patient reports that she has been out of her HTN medications for couple weeks and needs refill and work note for couple days since work sent her home today due to BP being to high.

## 2017-03-06 NOTE — Discharge Instructions (Signed)
It is very important that you follow up with a primary care doctor for your blood pressure. Your high blood pressure can affect your kidneys, your heart and many other organs of your body.

## 2017-03-06 NOTE — ED Provider Notes (Signed)
[facility]-EMERGENCY DEPT Provider Note   CSN: [id number] Arrival date & time: 03/06/17  1005     History   Chief Complaint Chief Complaint  Patient presents with  . Medication Refill    HPI [name] is a 56 y.o. female who presents to the ED for medication refill. She reports she has been out of her BP medication for 3 months. Last night she got dizzy at work and they made her go home and told her she could not return until she started back on her BP medication and was evaluated. Today patient c/o mild headache and feeling tired but denies dizziness. Patient reports that she lost her insurance and was not able to see her PCP to get her medication refilled. She will have insurance in a few weeks and will be able to return to her PCP.  Patent also c/o frequent urination and urgency.   HPI  Past Medical History:  Diagnosis Date  . COPD (chronic obstructive pulmonary disease) (HCC)   . Hypertension   . Renal disorder    kidney stone    There are no active problems to display for this patient.   Past Surgical History:  Procedure Laterality Date  . TUBAL LIGATION      OB History    No data available       Home Medications    Prior to Admission medications   Medication Sig Start Date End Date Taking? Authorizing Provider  divalproex (DEPAKOTE) 500 MG DR tablet Take 500 mg by mouth 3 (three) times daily.   Yes [provider]  FLUoxetine HCl (PROZAC PO) Take 1 tablet by mouth 3 (three) times daily as needed (depression).    Yes [provider]  Melatonin 10 MG TABS Take 10 mg by mouth at bedtime.   Yes [provider]  RISPERIDONE PO Take 1 tablet by mouth 2 (two) times daily.    Yes [provider]  amLODipine (NORVASC) 5 MG tablet Take 1 tablet (5 mg total) by mouth daily. 03/06/17   Marissa Napoleon, NP    Family History No family history on file.  Social History Social History  Substance Use Topics  . Smoking status:  Current Every Day Smoker    Types: Cigarettes  . Smokeless tobacco: Never Used  . Alcohol use Yes     Allergies   Patient has no known allergies.   Review of Systems Review of Systems  Constitutional: Negative for chills and fever.  HENT: Negative.   Eyes: Positive for visual disturbance (blurry at times).  Respiratory: Positive for shortness of breath (chronic COPD) and wheezing.   Cardiovascular: Negative for chest pain, palpitations and leg swelling.  Gastrointestinal: Negative for abdominal pain, nausea and vomiting.  Genitourinary: Positive for frequency and urgency. Negative for dysuria, hematuria, vaginal bleeding and vaginal discharge.  Musculoskeletal: Negative for back pain and neck pain.  Skin: Negative for rash.  Neurological: Positive for dizziness (last night at work) and headaches (mild). Negative for syncope.  Psychiatric/Behavioral: Negative for confusion. Nervous/anxious: controlled with medication.      Physical Exam Updated Vital Signs BP (!) 190/135   Pulse 64   Temp 98.5 F (36.9 C) (Oral)   Resp 18   SpO2 95%   Physical Exam  Constitutional: She appears well-developed and well-nourished. No distress.  HENT:  Head: Normocephalic and atraumatic.  Eyes: Conjunctivae and EOM are normal.  Neck: Normal range of motion. Neck supple.  Cardiovascular: Normal rate and regular rhythm.  Pulmonary/Chest: Effort normal and breath sounds normal.  Musculoskeletal: Normal range of motion.  Neurological: She is alert.  Skin: Skin is warm and dry.  Psychiatric: She has a normal mood and affect. Her behavior is normal.  Nursing note and vitals reviewed.    ED Treatments / Results  Labs (all labs ordered are listed, but only abnormal results are displayed) Labs Reviewed  BASIC METABOLIC PANEL - Abnormal; Notable for the following:       Result Value   Glucose, Bld 119 (*)    All other components within normal limits  URINALYSIS, ROUTINE W REFLEX  MICROSCOPIC - Abnormal; Notable for the following:    Hgb urine dipstick SMALL (*)    Squamous Epithelial / LPF 0-5 (*)    All other components within normal limits  CBC   Radiology No results found.  Procedures Procedures (including critical care time)  Medications Ordered in ED Medications  amLODipine (NORVASC) tablet 5 mg (5 mg Oral Given 03/06/17 1203)  ipratropium-albuterol (DUONEB) 0.5-2.5 (3) MG/3ML nebulizer solution 3 mL (3 mLs Nebulization Given 03/06/17 1203)  acetaminophen (TYLENOL) tablet 650 mg (650 mg Oral Given 03/06/17 1331)     Initial Impression / Assessment and Plan / ED Course  I have reviewed the triage vital signs and the nursing notes. I discussed this case with Dr. Verdie Mosher.  56 y.o. female here today with elevated BP and need for refill. She has been out of her medication for 3 months. Patient stable for d/c at this time without symptoms and normal labs. Will start BP medication. Discussed with the patient in detail problems associated with untreated hypertension and need for taking medication and need for follow up with a PCP. Patient states her insurance goes into effect in 3 weeks and she will be able to see her PCP at that time. Return precautions discussed.    Final Clinical Impressions(s) / ED Diagnoses   Final diagnoses:  Essential hypertension  Medication refill    New Prescriptions Discharge Medication List as of 03/06/2017  2:09 PM    START taking these medications   Details  amLODipine (NORVASC) 5 MG tablet Take 1 tablet (5 mg total) by mouth daily., Starting Sat 03/06/2017, Print         [place], [place], [place] 03/06/17 2204    Lavera Guise, MD 03/07/17 [phone]

## 2017-06-08 ENCOUNTER — Ambulatory Visit (HOSPITAL_COMMUNITY)
Admission: EM | Admit: 2017-06-08 | Discharge: 2017-06-08 | Disposition: A | Discharge status: Home / Self Care | Payer: Self-pay | Attending: Family Medicine | Admitting: Family Medicine

## 2017-06-08 ENCOUNTER — Encounter (HOSPITAL_COMMUNITY): Payer: Self-pay | Admitting: Emergency Medicine

## 2017-06-08 DIAGNOSIS — I10 Essential (primary) hypertension: Secondary | ICD-10-CM

## 2017-06-08 DIAGNOSIS — J449 Chronic obstructive pulmonary disease, unspecified: Secondary | ICD-10-CM

## 2017-06-08 MED ORDER — ALBUTEROL SULFATE HFA 108 (90 BASE) MCG/ACT IN AERS
INHALATION_SPRAY | RESPIRATORY_TRACT | Status: AC
  Filled 2017-06-08: qty 6.7

## 2017-06-08 MED ORDER — CLONIDINE HCL 0.1 MG PO TABS
0.2000 mg | ORAL_TABLET | Freq: Once | ORAL | Status: AC
  Administered 2017-06-08: 0.2 mg via ORAL

## 2017-06-08 MED ORDER — TIOTROPIUM BROMIDE MONOHYDRATE 2.5 MCG/ACT IN AERS: 2.0000 | INHALATION_SPRAY | Freq: Every day | RESPIRATORY_TRACT | 1 refills | Status: DC

## 2017-06-08 MED ORDER — AMLODIPINE BESYLATE 10 MG PO TABS: 10.0000 mg | ORAL_TABLET | Freq: Every day | ORAL | 1 refills | Status: DC

## 2017-06-08 MED ORDER — CLONIDINE HCL 0.1 MG PO TABS
ORAL_TABLET | ORAL | Status: AC
  Filled 2017-06-08: qty 2

## 2017-06-08 MED ORDER — ALBUTEROL SULFATE HFA 108 (90 BASE) MCG/ACT IN AERS: 2.0000 | INHALATION_SPRAY | Freq: Four times a day (QID) | RESPIRATORY_TRACT | 2 refills | Status: DC | PRN

## 2017-06-08 NOTE — ED Triage Notes (Signed)
PT reports history of COPD and reports she has been wheezing for a few weeks. PT was seen in the ER for same a few weeks ago. PT reports hypertension for 2 months and she has been off of her BP meds because she can't afford them.

## 2017-06-08 NOTE — ED Provider Notes (Signed)
[facility]    CSN: [id number] Arrival date & time: 06/08/17  1003     History   Chief Complaint Chief Complaint  Patient presents with  . Shortness of Breath  . Hypertension    HPI Marissa Owens is a 57 y.o. female.   This is a 57 year old female, current smoker, with history of COPD and hypertension with no primary care provider, presents today for shortness of breath and high blood pressure. She ran out of her BP medication a few months ago. The blood pressure is 212/132 today. Patient reports headache and lightheadedness. Denies chest pain/tightness, wheezing or visual disturbances. Also not taking any inhalers for her COPD because she couldn't afford them.  Her insurance will start next month. She declines ER visit.    The history is provided by the patient.    Past Medical History:  Diagnosis Date  . COPD (chronic obstructive pulmonary disease) (HCC)   . Hypertension   . Renal disorder    kidney stone    There are no active problems to display for this patient.   Past Surgical History:  Procedure Laterality Date  . TUBAL LIGATION      OB History    No data available       Home Medications    Prior to Admission medications   Medication Sig Start Date End Date Taking? Authorizing Provider  divalproex (DEPAKOTE) 500 MG DR tablet Take 500 mg by mouth 3 (three) times daily.   Yes [provider]  FLUoxetine HCl (PROZAC PO) Take 1 tablet by mouth 3 (three) times daily as needed (depression).    Yes [provider]  RISPERIDONE PO Take 1 tablet by mouth 2 (two) times daily.    Yes [provider]  amLODipine (NORVASC) 5 MG tablet Take 1 tablet (5 mg total) by mouth daily. 03/06/17   Janne NapoleonNeese, Hope M, NP  Melatonin 10 MG TABS Take 10 mg by mouth at bedtime.    [provider]    Family History No family history on file.  Social History Social History   Tobacco Use  . Smoking status: Current Every Day Smoker     Packs/day: 1.00    Types: Cigarettes  . Smokeless tobacco: Never Used  Substance Use Topics  . Alcohol use: No    Frequency: Never  . Drug use: No     Allergies   Patient has no known allergies.   Review of Systems Review of Systems  Constitutional: Negative for chills, fatigue and fever.  Eyes: Negative for visual disturbance.  Respiratory: Positive for shortness of breath. Negative for chest tightness and wheezing.   Cardiovascular: Negative for chest pain and palpitations.  Gastrointestinal: Negative for abdominal pain and nausea.  Skin: Negative for rash.  Neurological: Positive for light-headedness and headaches. Negative for weakness.     Physical Exam Triage Vital Signs ED Triage Vitals  Enc Vitals Group     BP 06/08/17 1023 (!) 212/132     Pulse Rate 06/08/17 1023 70     Resp 06/08/17 1023 16     Temp 06/08/17 1023 98.2 F (36.8 C)     Temp Source 06/08/17 1023 Oral     SpO2 06/08/17 1023 96 %     Weight 06/08/17 1021 150 lb (68 kg)     Height 06/08/17 1021 5\' 5"  (1.651 m)     Head Circumference --      Peak Flow --      Pain  Score 06/08/17 1021 8     Pain Loc --      Pain Edu? --      Excl. in GC? --    No data found.  Updated Vital Signs BP (!) 212/132 (BP Location: Right Arm)   Pulse 70   Temp 98.2 F (36.8 C) (Oral)   Resp 16   Ht 5\' 5"  (1.651 m)   Wt 150 lb (68 kg)   SpO2 96%   BMI 24.96 kg/m   Visual Acuity Right Eye Distance:   Left Eye Distance:   Bilateral Distance:    Right Eye Near:   Left Eye Near:    Bilateral Near:     Physical Exam  Constitutional: She is oriented to person, place, and time. She appears well-developed and well-nourished. No distress.  Cardiovascular: Normal rate, regular rhythm, normal heart sounds and intact distal pulses.  No murmur heard. Pulmonary/Chest: Effort normal and breath sounds normal. No respiratory distress. She has no wheezes.  Abdominal: Soft. Bowel sounds are normal. She exhibits no  distension. There is no tenderness.  Neurological: She is alert and oriented to person, place, and time. She is not disoriented.  Skin: Skin is warm and dry.  Psychiatric: She has a normal mood and affect.  Nursing note and vitals reviewed.    UC Treatments / Results  Labs (all labs ordered are listed, but only abnormal results are displayed) Labs Reviewed - No data to display  EKG  EKG Interpretation None       Radiology No results found.  Procedures Procedures (including critical care time)  Medications Ordered in UC Medications  cloNIDine (CATAPRES) tablet 0.2 mg (0.2 mg Oral Given 06/08/17 1042)     Initial Impression / Assessment and Plan / UC Course  I have reviewed the triage vital signs and the nursing notes.  Pertinent labs & imaging results that were available during my care of the patient were reviewed by me and considered in my medical decision making (see chart for details).    Final Clinical Impressions(s) / UC Diagnoses   Final diagnoses:  Essential hypertension  COPD mixed type (HCC)   57 y.o. Female, presenting today for COPD and hypertension management. She is in no acute distress. Exam was unremarkable. She declined ER visit.   Hypertension 10:42: Given 0.2mg  Clonidine.  11:15: Patient states that her Headache is easing off and she is no longer dizzy. BP much improved Send home on Amlodipine 10 mg, which is $10 for 90 days supply; patient reports that she can afford this. She agrees to obtain a PCP once she obtains her insurance.   COPD No wheezing noted today. O2Sat is good.  Send home on Spiriva 2 puffs daily; Patient states that her sister will help her pay for this.  Also send home on Albuterol inhaler to use PRN.  Encourage to stop smoking.    ED Discharge Orders    None     Controlled Substance Prescriptions [place] Controlled Substance Registry consulted? Not Applicable   Lucia Estelle, NP 06/08/17 1142

## 2017-07-11 ENCOUNTER — Other Ambulatory Visit: Payer: Self-pay

## 2017-07-11 ENCOUNTER — Inpatient Hospital Stay (HOSPITAL_COMMUNITY)
Admission: EM | Admit: 2017-07-11 | Discharge: 2017-07-13 | DRG: 190 | Disposition: A | Discharge status: Home / Self Care | Payer: Self-pay | Attending: Internal Medicine | Admitting: Internal Medicine

## 2017-07-11 ENCOUNTER — Emergency Department (HOSPITAL_COMMUNITY): Payer: Self-pay

## 2017-07-11 ENCOUNTER — Encounter (HOSPITAL_COMMUNITY): Payer: Self-pay | Admitting: Emergency Medicine

## 2017-07-11 DIAGNOSIS — J441 Chronic obstructive pulmonary disease with (acute) exacerbation: Principal | ICD-10-CM | POA: Diagnosis present

## 2017-07-11 DIAGNOSIS — M1990 Unspecified osteoarthritis, unspecified site: Secondary | ICD-10-CM | POA: Diagnosis present

## 2017-07-11 DIAGNOSIS — Z720 Tobacco use: Secondary | ICD-10-CM

## 2017-07-11 DIAGNOSIS — F17210 Nicotine dependence, cigarettes, uncomplicated: Secondary | ICD-10-CM | POA: Diagnosis present

## 2017-07-11 DIAGNOSIS — R739 Hyperglycemia, unspecified: Secondary | ICD-10-CM | POA: Diagnosis not present

## 2017-07-11 DIAGNOSIS — Z716 Tobacco abuse counseling: Secondary | ICD-10-CM

## 2017-07-11 DIAGNOSIS — J9601 Acute respiratory failure with hypoxia: Secondary | ICD-10-CM | POA: Diagnosis present

## 2017-07-11 DIAGNOSIS — Z87442 Personal history of urinary calculi: Secondary | ICD-10-CM

## 2017-07-11 DIAGNOSIS — R7989 Other specified abnormal findings of blood chemistry: Secondary | ICD-10-CM

## 2017-07-11 DIAGNOSIS — N189 Chronic kidney disease, unspecified: Secondary | ICD-10-CM | POA: Diagnosis present

## 2017-07-11 DIAGNOSIS — J9600 Acute respiratory failure, unspecified whether with hypoxia or hypercapnia: Secondary | ICD-10-CM

## 2017-07-11 DIAGNOSIS — I129 Hypertensive chronic kidney disease with stage 1 through stage 4 chronic kidney disease, or unspecified chronic kidney disease: Secondary | ICD-10-CM | POA: Diagnosis present

## 2017-07-11 DIAGNOSIS — T380X5A Adverse effect of glucocorticoids and synthetic analogues, initial encounter: Secondary | ICD-10-CM | POA: Diagnosis not present

## 2017-07-11 DIAGNOSIS — Z8249 Family history of ischemic heart disease and other diseases of the circulatory system: Secondary | ICD-10-CM

## 2017-07-11 DIAGNOSIS — Y92230 Patient room in hospital as the place of occurrence of the external cause: Secondary | ICD-10-CM | POA: Diagnosis not present

## 2017-07-11 DIAGNOSIS — E876 Hypokalemia: Secondary | ICD-10-CM | POA: Diagnosis present

## 2017-07-11 DIAGNOSIS — Z79899 Other long term (current) drug therapy: Secondary | ICD-10-CM

## 2017-07-11 DIAGNOSIS — I10 Essential (primary) hypertension: Secondary | ICD-10-CM

## 2017-07-11 LAB — CBC WITH DIFFERENTIAL/PLATELET
BASOS ABS: 0 10*3/uL (ref 0.0–0.1)
BASOS PCT: 0 %
Eosinophils Absolute: 0.1 10*3/uL (ref 0.0–0.7)
Eosinophils Relative: 1 %
HEMATOCRIT: 41.3 % (ref 36.0–46.0)
HEMOGLOBIN: 14.1 g/dL (ref 12.0–15.0)
LYMPHS PCT: 34 %
Lymphs Abs: 2.4 10*3/uL (ref 0.7–4.0)
MCH: 32.3 pg (ref 26.0–34.0)
MCHC: 34.1 g/dL (ref 30.0–36.0)
MCV: 94.5 fL (ref 78.0–100.0)
MONO ABS: 1.6 10*3/uL — AB (ref 0.1–1.0)
MONOS PCT: 22 %
NEUTROS ABS: 3.1 10*3/uL (ref 1.7–7.7)
NEUTROS PCT: 43 %
Platelets: 151 10*3/uL (ref 150–400)
RBC: 4.37 MIL/uL (ref 3.87–5.11)
RDW: 12.6 % (ref 11.5–15.5)
WBC: 7.2 10*3/uL (ref 4.0–10.5)

## 2017-07-11 LAB — BASIC METABOLIC PANEL
ANION GAP: 7 (ref 5–15)
BUN: 5 mg/dL — AB (ref 6–20)
CO2: 29 mmol/L (ref 22–32)
Calcium: 9 mg/dL (ref 8.9–10.3)
Chloride: 99 mmol/L — ABNORMAL LOW (ref 101–111)
Creatinine, Ser: 0.67 mg/dL (ref 0.44–1.00)
GFR calc Af Amer: >60 mL/min (ref >60)
GLUCOSE: 137 mg/dL — AB (ref 65–99)
POTASSIUM: 3.3 mmol/L — AB (ref 3.5–5.1)
Sodium: 135 mmol/L (ref 135–145)

## 2017-07-11 LAB — INFLUENZA PANEL BY PCR (TYPE A & B)
INFLAPCR: NEGATIVE
INFLBPCR: NEGATIVE

## 2017-07-11 LAB — I-STAT TROPONIN, ED: Troponin i, poc: 0 ng/mL (ref 0.00–0.08)

## 2017-07-11 LAB — BRAIN NATRIURETIC PEPTIDE: B NATRIURETIC PEPTIDE 5: 130 pg/mL — AB (ref 0.0–100.0)

## 2017-07-11 LAB — D-DIMER, QUANTITATIVE (NOT AT ARMC): D DIMER QUANT: 0.35 ug{FEU}/mL (ref 0.00–0.50)

## 2017-07-11 MED ORDER — MELATONIN 5 MG PO TABS
10.0000 mg | ORAL_TABLET | Freq: Every day | ORAL | Status: DC
  Administered 2017-07-11 – 2017-07-12 (×2): 10 mg via ORAL
  Filled 2017-07-11 (×3): qty 2

## 2017-07-11 MED ORDER — POTASSIUM CHLORIDE CRYS ER 20 MEQ PO TBCR
20.0000 meq | EXTENDED_RELEASE_TABLET | ORAL | Status: AC
  Administered 2017-07-11: 20 meq via ORAL
  Filled 2017-07-11: qty 1

## 2017-07-11 MED ORDER — AZITHROMYCIN 250 MG PO TABS
250.0000 mg | ORAL_TABLET | Freq: Every day | ORAL | Status: DC
  Administered 2017-07-12 – 2017-07-13 (×2): 250 mg via ORAL
  Filled 2017-07-11 (×2): qty 1

## 2017-07-11 MED ORDER — FLUOXETINE HCL 20 MG PO CAPS
60.0000 mg | ORAL_CAPSULE | Freq: Every day | ORAL | Status: DC
  Administered 2017-07-11 – 2017-07-13 (×3): 60 mg via ORAL
  Filled 2017-07-11 (×3): qty 3

## 2017-07-11 MED ORDER — IPRATROPIUM-ALBUTEROL 0.5-2.5 (3) MG/3ML IN SOLN
3.0000 mL | RESPIRATORY_TRACT | Status: DC | PRN
  Filled 2017-07-11: qty 3

## 2017-07-11 MED ORDER — METHYLPREDNISOLONE SODIUM SUCC 125 MG IJ SOLR
125.0000 mg | Freq: Once | INTRAMUSCULAR | Status: AC
  Administered 2017-07-11: 125 mg via INTRAVENOUS
  Filled 2017-07-11: qty 2

## 2017-07-11 MED ORDER — ENOXAPARIN SODIUM 40 MG/0.4ML ~~LOC~~ SOLN
40.0000 mg | SUBCUTANEOUS | Status: DC
  Administered 2017-07-11 – 2017-07-13 (×3): 40 mg via SUBCUTANEOUS
  Filled 2017-07-11 (×3): qty 0.4

## 2017-07-11 MED ORDER — PNEUMOCOCCAL VAC POLYVALENT 25 MCG/0.5ML IJ INJ
0.5000 mL | INJECTION | INTRAMUSCULAR | Status: DC
  Filled 2017-07-11: qty 0.5

## 2017-07-11 MED ORDER — AMLODIPINE BESYLATE 5 MG PO TABS
10.0000 mg | ORAL_TABLET | Freq: Every day | ORAL | Status: DC
  Administered 2017-07-11 – 2017-07-13 (×3): 10 mg via ORAL
  Filled 2017-07-11 (×3): qty 2

## 2017-07-11 MED ORDER — DIVALPROEX SODIUM 500 MG PO DR TAB
500.0000 mg | DELAYED_RELEASE_TABLET | Freq: Two times a day (BID) | ORAL | Status: DC
  Administered 2017-07-11 – 2017-07-13 (×5): 500 mg via ORAL
  Filled 2017-07-11: qty 2
  Filled 2017-07-11 (×2): qty 1
  Filled 2017-07-11: qty 2
  Filled 2017-07-11: qty 1
  Filled 2017-07-11: qty 2
  Filled 2017-07-11 (×3): qty 1
  Filled 2017-07-11: qty 2

## 2017-07-11 MED ORDER — RISPERIDONE 2 MG PO TABS
2.0000 mg | ORAL_TABLET | Freq: Two times a day (BID) | ORAL | Status: DC
  Administered 2017-07-11 – 2017-07-13 (×5): 2 mg via ORAL
  Filled 2017-07-11 (×6): qty 1

## 2017-07-11 MED ORDER — IPRATROPIUM-ALBUTEROL 0.5-2.5 (3) MG/3ML IN SOLN
3.0000 mL | Freq: Once | RESPIRATORY_TRACT | Status: AC
  Administered 2017-07-11: 3 mL via RESPIRATORY_TRACT
  Filled 2017-07-11: qty 3

## 2017-07-11 MED ORDER — ACETAMINOPHEN 650 MG RE SUPP: 650.0000 mg | Freq: Four times a day (QID) | RECTAL | Status: DC | PRN

## 2017-07-11 MED ORDER — IPRATROPIUM-ALBUTEROL 0.5-2.5 (3) MG/3ML IN SOLN
3.0000 mL | Freq: Four times a day (QID) | RESPIRATORY_TRACT | Status: DC
  Administered 2017-07-12 – 2017-07-13 (×7): 3 mL via RESPIRATORY_TRACT
  Filled 2017-07-11 (×7): qty 3

## 2017-07-11 MED ORDER — IPRATROPIUM-ALBUTEROL 0.5-2.5 (3) MG/3ML IN SOLN
3.0000 mL | Freq: Four times a day (QID) | RESPIRATORY_TRACT | Status: DC
  Administered 2017-07-11 (×3): 3 mL via RESPIRATORY_TRACT
  Filled 2017-07-11 (×2): qty 3

## 2017-07-11 MED ORDER — IPRATROPIUM-ALBUTEROL 0.5-2.5 (3) MG/3ML IN SOLN: 3.0000 mL | RESPIRATORY_TRACT | Status: DC | PRN

## 2017-07-11 MED ORDER — ALBUTEROL SULFATE (2.5 MG/3ML) 0.083% IN NEBU
5.0000 mg | INHALATION_SOLUTION | Freq: Once | RESPIRATORY_TRACT | Status: AC
  Administered 2017-07-11: 5 mg via RESPIRATORY_TRACT
  Filled 2017-07-11: qty 6

## 2017-07-11 MED ORDER — ACETAMINOPHEN 325 MG PO TABS: 650.0000 mg | ORAL_TABLET | Freq: Four times a day (QID) | ORAL | Status: DC | PRN

## 2017-07-11 MED ORDER — DM-GUAIFENESIN ER 30-600 MG PO TB12
1.0000 | ORAL_TABLET | Freq: Two times a day (BID) | ORAL | Status: DC
  Administered 2017-07-11 – 2017-07-13 (×5): 1 via ORAL
  Filled 2017-07-11 (×5): qty 1

## 2017-07-11 MED ORDER — METHYLPREDNISOLONE SODIUM SUCC 125 MG IJ SOLR
60.0000 mg | Freq: Four times a day (QID) | INTRAMUSCULAR | Status: DC
  Administered 2017-07-11 – 2017-07-13 (×9): 60 mg via INTRAVENOUS
  Filled 2017-07-11 (×9): qty 2

## 2017-07-11 MED ORDER — POTASSIUM CHLORIDE CRYS ER 20 MEQ PO TBCR
40.0000 meq | EXTENDED_RELEASE_TABLET | Freq: Once | ORAL | Status: AC
  Administered 2017-07-11: 40 meq via ORAL
  Filled 2017-07-11: qty 2

## 2017-07-11 MED ORDER — AZITHROMYCIN 250 MG PO TABS
500.0000 mg | ORAL_TABLET | Freq: Every day | ORAL | Status: AC
  Administered 2017-07-11: 500 mg via ORAL
  Filled 2017-07-11: qty 2

## 2017-07-11 MED ORDER — ACETAMINOPHEN 325 MG PO TABS
650.0000 mg | ORAL_TABLET | Freq: Once | ORAL | Status: AC
  Administered 2017-07-11: 650 mg via ORAL
  Filled 2017-07-11: qty 2

## 2017-07-11 NOTE — ED Notes (Signed)
Report given to Niel HummerWendy Willard, RN [phone]. Will page MD about admission orders.

## 2017-07-11 NOTE — Progress Notes (Signed)
Pt has arrived to room 1521 from ED. Alert, oriented, and without c/o. VS WNL

## 2017-07-11 NOTE — ED Notes (Signed)
Pt c/o congestion, SOB, fever/chills for 3 days. She has tried taking theraflu.

## 2017-07-11 NOTE — ED Provider Notes (Signed)
[facility]-EMERGENCY DEPT Provider Note   CSN: [id number] Arrival date & time: 07/11/17  0029     History   Chief Complaint Chief Complaint  Patient presents with  . Shortness of Breath    HPI Marissa Owens is a 57 y.o. female.  The history is provided by the patient.  She has a history of hypertension and COPD and comes in with a 3-day history of nonproductive cough and dyspnea.  She is also complaining of some aching in her mid and lower back which she rates at 6/10.  She states she has had fever as high as 99.2.  There is been no chills or sweats.  She denies any chest pain, heaviness, tightness, pressure.  Dyspnea is worse if she lays flat.  She has not done anything to treat her symptoms.  Past Medical History:  Diagnosis Date  . COPD (chronic obstructive pulmonary disease) (HCC)   . Hypertension   . Renal disorder    kidney stone    There are no active problems to display for this patient.   Past Surgical History:  Procedure Laterality Date  . TUBAL LIGATION      OB History    No data available       Home Medications    Prior to Admission medications   Medication Sig Start Date End Date Taking? Authorizing Provider  albuterol (PROVENTIL HFA;VENTOLIN HFA) 108 (90 Base) MCG/ACT inhaler Inhale 2 puffs into the lungs every 6 (six) hours as needed for wheezing or shortness of breath. 06/08/17   Lucia Estelle, NP  amLODipine (NORVASC) 10 MG tablet Take 1 tablet (10 mg total) by mouth daily. 06/08/17 09/06/17  Lucia Estelle, NP  divalproex (DEPAKOTE) 500 MG DR tablet Take 500 mg by mouth 3 (three) times daily.    [provider]  FLUoxetine HCl (PROZAC PO) Take 1 tablet by mouth 3 (three) times daily as needed (depression).     [provider]  Melatonin 10 MG TABS Take 10 mg by mouth at bedtime.    [provider]  RISPERIDONE PO Take 1 tablet by mouth 2 (two) times daily.     [provider]  Tiotropium  Bromide Monohydrate (SPIRIVA RESPIMAT) 2.5 MCG/ACT AERS Inhale 2 puffs into the lungs daily. 06/08/17 07/08/17  Lucia Estelle, NP    Family History History reviewed. No pertinent family history.  Social History Social History   Tobacco Use  . Smoking status: Current Every Day Smoker    Packs/day: 1.00    Types: Cigarettes  . Smokeless tobacco: Never Used  Substance Use Topics  . Alcohol use: No    Frequency: Never  . Drug use: No     Allergies   Patient has no known allergies.   Review of Systems Review of Systems  All other systems reviewed and are negative.    Physical Exam Updated Vital Signs BP (!) 148/102   Pulse 87   Temp 99.2 F (37.3 C) (Oral)   Ht 5\' 2"  (1.575 m)   Wt 68 kg (150 lb)   SpO2 94%   BMI 27.44 kg/m   Physical Exam  Nursing note and vitals reviewed.  57 year old female, resting comfortably and in no acute distress. Vital signs are significant for elevated blood pressure. Oxygen saturation is 94%, which is normal. Head is normocephalic and atraumatic. PERRLA, EOMI. Oropharynx is clear. Neck is nontender and supple without adenopathy. JVD is present at 45 degrees. Back is nontender and  there is no CVA tenderness. Lungs have diffuse expiratory wheezes without rales or rhonchi. Chest is nontender. Heart has regular rate and rhythm without murmur. Abdomen is soft, flat, nontender without masses or hepatosplenomegaly and peristalsis is normoactive. Extremities have no cyanosis or edema, full range of motion is present. Skin is warm and dry without rash. Neurologic: Mental status is normal, cranial nerves are intact, there are no motor or sensory deficits.  ED Treatments / Results  Labs (all labs ordered are listed, but only abnormal results are displayed) Labs Reviewed  BASIC METABOLIC PANEL - Abnormal; Notable for the following components:      Result Value   Potassium 3.3 (*)    Chloride 99 (*)    Glucose, Bld 137 (*)    BUN 5 (*)    All  other components within normal limits  BRAIN NATRIURETIC PEPTIDE - Abnormal; Notable for the following components:   B Natriuretic Peptide 130.0 (*)    All other components within normal limits  CBC WITH DIFFERENTIAL/PLATELET - Abnormal; Notable for the following components:   Monocytes Absolute 1.6 (*)    All other components within normal limits  I-STAT TROPONIN, ED    EKG  EKG Interpretation  Date/Time:  Sunday July 11 2017 02:00:22 EST Ventricular Rate:  92 PR Interval:    QRS Duration: 90 QT Interval:  384 QTC Calculation: 475 R Axis:   65 Text Interpretation:  Sinus rhythm Borderline T abnormalities, anterior leads When compared with ECG of 08/07/2009, Nonspecific T wave abnormality is now present HEART RATE has increased Confirmed by Dione Booze ([id number]) on 07/11/2017 2:07:12 AM       Radiology Dg Chest 2 View  Result Date: 07/11/2017 CLINICAL DATA:  Shortness of breath.  Fever and chills. EXAM: CHEST  2 VIEW COMPARISON:  01/30/2014 FINDINGS: The cardiomediastinal contours are normal. Moderate bronchial thickening. Pulmonary vasculature is normal. No consolidation, pleural effusion, or pneumothorax. No acute osseous abnormalities are seen. IMPRESSION: Moderate bronchial thickening without focal airspace disease. Electronically Signed   By: Rubye Oaks M.D.   On: 07/11/2017 01:47    Procedures Procedures (including critical care time)  Medications Ordered in ED Medications  albuterol (PROVENTIL) (2.5 MG/3ML) 0.083% nebulizer solution 5 mg (5 mg Nebulization Given 07/11/17 0141)  methylPREDNISolone sodium succinate (SOLU-MEDROL) 125 mg/2 mL injection 125 mg (125 mg Intravenous Given 07/11/17 0209)  acetaminophen (TYLENOL) tablet 650 mg (650 mg Oral Given 07/11/17 0319)  ipratropium-albuterol (DUONEB) 0.5-2.5 (3) MG/3ML nebulizer solution 3 mL (3 mLs Nebulization Given 07/11/17 0326)  ipratropium-albuterol (DUONEB) 0.5-2.5 (3) MG/3ML nebulizer solution 3 mL (3 mLs  Nebulization Given 07/11/17 0443)     Initial Impression / Assessment and Plan / ED Course  I have reviewed the triage vital signs and the nursing notes.  Pertinent labs & imaging results that were available during my care of the patient were reviewed by me and considered in my medical decision making (see chart for details).  Dyspnea with cough and wheezing most likely COPD exacerbation.  Presence of JVD and orthopnea is concerning for possible CHF, so will screen with BNP.  Chest x-ray is ordered.  Old records are reviewed, and she does have a prior ED visit for COPD.  She is given albuterol with ipratropium via nebulizer, and will give methylprednisolone.  There is minimal improvement with first nebulizer treatment.  She is given a second nebulizer treatment with albuterol and ipratropium with modest additional improvement.  After third nebulizer treatment, lungs did sound clear at  rest, but when she ambulated, oxygen saturation quickly dropped to 85%.  Chest x-ray showed no evidence of pneumonia.  BNP is mildly elevated indicating possible component of heart failure.  Case is discussed with Dr. Katrinka BlazingSmith of [facility], who agrees to admit the patient.  Final Clinical Impressions(s) / ED Diagnoses   Final diagnoses:  COPD exacerbation (HCC)  Elevated brain natriuretic peptide (BNP) level    ED Discharge Orders    None       Dione BoozeGlick, Maurica Omura, MD 07/11/17 [phone]

## 2017-07-11 NOTE — H&P (Signed)
[facility] History and Physical  Marissa JacobsonJudith J Sandhu ZOX:[id number] DOB: 03/07/1961 DOA: 07/11/2017  Referring physician:  PCP: Patient, No Pcp Per  Specialists:   Chief Complaint: shortness of breath, cough   HPI: Marissa Owens is a 57 y.o. female with PMH of HTN, CKD, Tobacco use, COPD presented with progressively worsening shortness of breath, associated with productive cough with yellow sputum. Wheezing for 1 week. She reports chills, no fever. She denies acute chest pains. No sick contacts. She continues to smoke 1PPD. She has DOE. No PNDs, no orthopnea, no leg edema. No GI symptoms. She reports arthritis related back pains. No focal neuro symptoms   Review of Systems: The patient denies anorexia, fever, weight loss,, vision loss, decreased hearing, hoarseness, chest pain, syncope, dyspnea on exertion, peripheral edema, balance deficits, hemoptysis, abdominal pain, melena, hematochezia, severe indigestion/heartburn, hematuria, incontinence, genital sores, muscle weakness, suspicious skin lesions, transient blindness, difficulty walking, depression, unusual weight change, abnormal bleeding, enlarged lymph nodes, angioedema, and breast masses.    Past Medical History:  Diagnosis Date  . COPD (chronic obstructive pulmonary disease) (HCC)   . Hypertension   . Renal disorder    kidney stone   Past Surgical History:  Procedure Laterality Date  . TUBAL LIGATION     Social History:  reports that she has been smoking cigarettes.  She has been smoking about 1.00 pack per day. she has never used smokeless tobacco. She reports that she does not drink alcohol or use drugs. Home;  where does patient live--home, ALF, SNF? and with whom if at home? Yes;  Can patient participate in ADLs?  No Known Allergies  History reviewed. No pertinent family history.  (be sure to complete). FH of HTN  Prior to Admission medications   Medication Sig Start Date End Date Taking? Authorizing Provider   albuterol (PROVENTIL HFA;VENTOLIN HFA) 108 (90 Base) MCG/ACT inhaler Inhale 2 puffs into the lungs every 6 (six) hours as needed for wheezing or shortness of breath. 06/08/17  Yes Lucia EstelleZheng, Feng, NP  amLODipine (NORVASC) 10 MG tablet Take 1 tablet (10 mg total) by mouth daily. 06/08/17 09/06/17 Yes Lucia EstelleZheng, Feng, NP  divalproex (DEPAKOTE) 500 MG DR tablet Take 500 mg by mouth 2 (two) times daily.    Yes [provider]  FLUoxetine (PROZAC) 20 MG tablet Take 60 mg by mouth daily.   Yes [provider]  Melatonin 10 MG TABS Take 10 mg by mouth at bedtime.   Yes [provider]  risperiDONE (RISPERDAL) 2 MG tablet Take 2 mg by mouth 2 (two) times daily.   Yes [provider]  Tiotropium Bromide Monohydrate (SPIRIVA RESPIMAT) 2.5 MCG/ACT AERS Inhale 2 puffs into the lungs daily. 06/08/17 07/08/17  Lucia EstelleZheng, Feng, NP   Physical Exam: Vitals:   07/11/17 0706 07/11/17 0730  BP: 125/81 (!) 135/94  Pulse: 94 91  Resp: 18   Temp: 98.2 F (36.8 C)   SpO2: 100% 96%     General:  Alert. No distress  Eyes: eom-i  ENT: no oral ulcers   Neck: supple   Cardiovascular: s1,s2 rrr  Respiratory: BL wheezing  Abdomen: soft, nt, nd   Skin: no rash   Musculoskeletal: no leg edema   Psychiatric: no hallucinations   Neurologic: CN 2-12 intact. Motor 5/5 symmetric   Labs on Admission:  Basic Metabolic Panel: Recent Labs  Lab 07/11/17 0203  NA 135  K 3.3*  CL 99*  CO2 29  GLUCOSE 137*  BUN 5*  CREATININE  0.67  CALCIUM 9.0   Liver Function Tests: No results for input(s): AST, ALT, ALKPHOS, BILITOT, PROT, ALBUMIN in the last 168 hours. No results for input(s): LIPASE, AMYLASE in the last 168 hours. No results for input(s): AMMONIA in the last 168 hours. CBC: Recent Labs  Lab 07/11/17 0203  WBC 7.2  NEUTROABS 3.1  HGB 14.1  HCT 41.3  MCV 94.5  PLT 151   Cardiac Enzymes: No results for input(s): CKTOTAL, CKMB, CKMBINDEX, TROPONINI in the last 168  hours.  BNP (last 3 results) Recent Labs    07/11/17 0203  BNP 130.0*    ProBNP (last 3 results) No results for input(s): PROBNP in the last 8760 hours.  CBG: No results for input(s): GLUCAP in the last 168 hours.  Radiological Exams on Admission: Dg Chest 2 View  Result Date: 07/11/2017 CLINICAL DATA:  Shortness of breath.  Fever and chills. EXAM: CHEST  2 VIEW COMPARISON:  01/30/2014 FINDINGS: The cardiomediastinal contours are normal. Moderate bronchial thickening. Pulmonary vasculature is normal. No consolidation, pleural effusion, or pneumothorax. No acute osseous abnormalities are seen. IMPRESSION: Moderate bronchial thickening without focal airspace disease. Electronically Signed   By: Rubye Oaks M.D.   On: 07/11/2017 01:47    EKG: Independently reviewed.   Assessment/Plan Active Problems:   COPD exacerbation (HCC)  57 y.o. female with PMH of HTN, CKD, Tobacco use, COPD presented with progressively worsening shortness of breath, associated with productive cough with yellow sputum.  COPD exacerbation. Unfortunately,  continues to smoke. We will start steroids, scheduled/prn bronchodilators, azithromycin. If needed oxygen. counseled to stop smoking. Recommend outpatient PFTs  Hypokalemia. Replace   HTN. Resume home amlodipine. Monitor    None;  if consultant consulted, please document name and whether formally or informally consulted  Code Status: full (must indicate code status--if unknown or must be presumed, indicate so) Family Communication: d/w patient, her family (indicate person spoken with, if applicable, with phone number if by telephone) Disposition Plan: home 24-48 hrs  (indicate anticipated LOS)  Time spent: >45 minutes   Esperanza Sheets [facility] Pager [phone]  If 7PM-7AM, please contact night-coverage [email or web address] Password [email or web address] 07/11/2017, 8:02 AM

## 2017-07-11 NOTE — ED Notes (Signed)
ED TO INPATIENT HANDOFF REPORT  Name/Age/Gender Marissa Owens 57 y.o. female  Code Status Code Status History    Date Active Date Inactive Code Status Order ID Comments User Context   11/12/2015 08:57 11/12/2015 13:52 Full Code [id number]  Ripley Fraise, MD ED      Home/SNF/Other Home  Chief Complaint shortness of breath  Level of Care/Admitting Diagnosis ED Disposition    ED Disposition Condition [facility] [facility] Area: [facility] [100102]  Level of Care: Telemetry [5]  Admit to tele based on following criteria: Monitor for Ischemic changes  Diagnosis: COPD exacerbation [facility]) [831517]  Admitting Physician: Norval Morton [[id number]]  Attending Physician: Norval Morton [[id number]]  PT Class (Do Not Modify): Observation [104]  PT Acc Code (Do Not Modify): Observation [10022]       Medical History Past Medical History:  Diagnosis Date  . COPD (chronic obstructive pulmonary disease) ([place])   . Hypertension   . Renal disorder    kidney stone    Allergies No Known Allergies  IV Location/Drains/Wounds Patient Lines/Drains/Airways Status   Active Line/Drains/Airways    Name:   Placement date:   Placement time:   Site:   Days:   Peripheral IV 07/11/17 Right Antecubital   07/11/17    0208    Antecubital   less than 1          Labs/Imaging Results for orders placed or performed during the hospital encounter of 07/11/17 (from the past 48 hour(s))  Basic metabolic panel     Status: Abnormal   Collection Time: 07/11/17  2:03 AM  Result Value Ref Range   Sodium 135 135 - 145 mmol/L   Potassium 3.3 (L) 3.5 - 5.1 mmol/L   Chloride 99 (L) 101 - 111 mmol/L   CO2 29 22 - 32 mmol/L   Glucose, Bld 137 (H) 65 - 99 mg/dL   BUN 5 (L) 6 - 20 mg/dL   Creatinine, Ser 0.67 0.44 - 1.00 mg/dL   Calcium 9.0 8.9 - 10.3 mg/dL   GFR calc non Af Amer >60 >60 mL/min   GFR calc Af Amer >60 >60 mL/min    Comment: (NOTE) The eGFR has been calculated  using the CKD EPI equation. This calculation has not been validated in all clinical situations. eGFR's persistently <60 mL/min signify possible Chronic Kidney Disease.    Anion gap 7 5 - 15    Comment: Performed at [facility], [place] [address], [place], [place] [address]  Brain natriuretic peptide     Status: Abnormal   Collection Time: 07/11/17  2:03 AM  Result Value Ref Range   B Natriuretic Peptide 130.0 (H) 0.0 - 100.0 pg/mL    Comment: Performed at [facility], [place] [address], [place], [place] [address]  CBC with Differential     Status: Abnormal   Collection Time: 07/11/17  2:03 AM  Result Value Ref Range   WBC 7.2 4.0 - 10.5 K/uL   RBC 4.37 3.87 - 5.11 MIL/uL   Hemoglobin 14.1 12.0 - 15.0 g/dL   HCT 41.3 36.0 - 46.0 %   MCV 94.5 78.0 - 100.0 fL   MCH 32.3 26.0 - 34.0 pg   MCHC 34.1 30.0 - 36.0 g/dL   RDW 12.6 11.5 - 15.5 %   Platelets 151 150 - 400 K/uL   Neutrophils Relative % 43 %   Neutro Abs 3.1 1.7 - 7.7 K/uL   Lymphocytes Relative  34 %   Lymphs Abs 2.4 0.7 - 4.0 K/uL   Monocytes Relative 22 %   Monocytes Absolute 1.6 (H) 0.1 - 1.0 K/uL   Eosinophils Relative 1 %   Eosinophils Absolute 0.1 0.0 - 0.7 K/uL   Basophils Relative 0 %   Basophils Absolute 0.0 0.0 - 0.1 K/uL    Comment: Performed at [facility], [place] [address], [place], [place] [address]  I-stat troponin, ED     Status: None   Collection Time: 07/11/17  2:16 AM  Result Value Ref Range   Troponin i, poc 0.00 0.00 - 0.08 ng/mL   Comment 3            Comment: Due to the release kinetics of cTnI, a negative result within the first hours of the onset of symptoms does not rule out myocardial infarction with certainty. If myocardial infarction is still suspected, repeat the test at appropriate intervals.   D-dimer, quantitative (not at [facility])     Status: None   Collection Time: 07/11/17  6:19 AM  Result Value Ref Range   D-Dimer, Quant 0.35 0.00  - 0.50 ug/mL-FEU    Comment: (NOTE) At the manufacturer cut-off of 0.50 ug/mL FEU, this assay has been documented to exclude PE with a sensitivity and negative predictive value of 97 to 99%.  At this time, this assay has not been approved by the FDA to exclude DVT/VTE. Results should be correlated with clinical presentation. Performed at [facility] [place] [address] [place], [place] [address]    Dg Chest 2 View  Result Date: 07/11/2017 CLINICAL DATA:  Shortness of breath.  Fever and chills. EXAM: CHEST  2 VIEW COMPARISON:  01/30/2014 FINDINGS: The cardiomediastinal contours are normal. Moderate bronchial thickening. Pulmonary vasculature is normal. No consolidation, pleural effusion, or pneumothorax. No acute osseous abnormalities are seen. IMPRESSION: Moderate bronchial thickening without focal airspace disease. Electronically Signed   By: Jeb Levering M.D.   On: 07/11/2017 01:47    Pending Labs Unresulted Labs (From admission, onward)   None      Vitals/Pain Today's Vitals   07/11/17 0630 07/11/17 0700 07/11/17 0706 07/11/17 0730  BP: 127/89 125/81 125/81 (!) 135/94  Pulse: 88  94 91  Resp:   18   Temp:   98.2 F (36.8 C)   TempSrc:   Oral   SpO2:   100%   Weight:      Height:      PainSc:        Isolation Precautions No active isolations  Medications Medications  ipratropium-albuterol (DUONEB) 0.5-2.5 (3) MG/3ML nebulizer solution 3 mL (not administered)  albuterol (PROVENTIL) (2.5 MG/3ML) 0.083% nebulizer solution 5 mg (5 mg Nebulization Given 07/11/17 0141)  methylPREDNISolone sodium succinate (SOLU-MEDROL) 125 mg/2 mL injection 125 mg (125 mg Intravenous Given 07/11/17 0209)  acetaminophen (TYLENOL) tablet 650 mg (650 mg Oral Given 07/11/17 0319)  ipratropium-albuterol (DUONEB) 0.5-2.5 (3) MG/3ML nebulizer solution 3 mL (3 mLs Nebulization Given 07/11/17 0326)  ipratropium-albuterol (DUONEB) 0.5-2.5 (3) MG/3ML nebulizer solution 3 mL (3 mLs  Nebulization Given 07/11/17 0443)  potassium chloride SA (K-DUR,KLOR-CON) CR tablet 20 mEq (20 mEq Oral Given 07/11/17 2671)    Mobility walks with person assist

## 2017-07-11 NOTE — Plan of Care (Signed)
Ms. Marissa LefevreJudith Owens is a 57 year old female with PMH of HTN and COPD; who presents with 3 days SOB.  Suspected COPD exacerbation with bronchial thickening and no pneumonia seen on x-ray.  Despite receiving 3 breathing treatments in the ED.  O2 saturations noted to drop to 87% after ambulating.  Labs revealed CBC wnl,  negative troponin, potassium 3.3, and BNP 130. Check d-dimer, DuoNeb's, give 20 mEq of potassium chloride.  Admit as observation to a telemetry bed.

## 2017-07-11 NOTE — ED Notes (Signed)
Pt's O2 saturation went down to 87% (room air) after a few feet while ambulating--- pt has verbalized,"I felt more congested when I walk".  Pt's O2 sat remained 88% on room air at rest.    Pt was given supplemental O2 at 2L/min via [place]--- O2 sat went up to 92%.

## 2017-07-12 LAB — BASIC METABOLIC PANEL
Anion gap: 12 (ref 5–15)
BUN: 18 mg/dL (ref 6–20)
CALCIUM: 9.5 mg/dL (ref 8.9–10.3)
CHLORIDE: 101 mmol/L (ref 101–111)
CO2: 26 mmol/L (ref 22–32)
CREATININE: 0.9 mg/dL (ref 0.44–1.00)
GFR calc non Af Amer: >60 mL/min (ref >60)
Glucose, Bld: 215 mg/dL — ABNORMAL HIGH (ref 65–99)
Potassium: 4.8 mmol/L (ref 3.5–5.1)
SODIUM: 139 mmol/L (ref 135–145)

## 2017-07-12 LAB — HIV ANTIBODY (ROUTINE TESTING W REFLEX): HIV Screen 4th Generation wRfx: NONREACTIVE

## 2017-07-12 LAB — GLUCOSE, CAPILLARY
GLUCOSE-CAPILLARY: 300 mg/dL — AB (ref 65–99)
Glucose-Capillary: 297 mg/dL — ABNORMAL HIGH (ref 65–99)
Glucose-Capillary: 230 mg/dL — ABNORMAL HIGH (ref 65–99)

## 2017-07-12 MED ORDER — INSULIN ASPART 100 UNIT/ML ~~LOC~~ SOLN
0.0000 [IU] | Freq: Three times a day (TID) | SUBCUTANEOUS | Status: DC
  Administered 2017-07-13: 5 [IU] via SUBCUTANEOUS
  Administered 2017-07-13: 2 [IU] via SUBCUTANEOUS

## 2017-07-12 MED ORDER — BENZONATATE 100 MG PO CAPS
200.0000 mg | ORAL_CAPSULE | Freq: Two times a day (BID) | ORAL | Status: DC | PRN
  Administered 2017-07-12 – 2017-07-13 (×2): 200 mg via ORAL
  Filled 2017-07-12 (×2): qty 2

## 2017-07-12 MED ORDER — ALBUTEROL SULFATE (2.5 MG/3ML) 0.083% IN NEBU: 2.5000 mg | INHALATION_SOLUTION | RESPIRATORY_TRACT | Status: DC | PRN

## 2017-07-12 MED ORDER — INSULIN ASPART 100 UNIT/ML ~~LOC~~ SOLN: 0.0000 [IU] | Freq: Every day | SUBCUTANEOUS | Status: DC

## 2017-07-12 NOTE — Progress Notes (Signed)
Patient coughing significantly, strong, nonproductive. Has nothing ordered for cough. Provider on call paged for something for cough. Will c/t monitor.

## 2017-07-12 NOTE — Progress Notes (Signed)
Inpatient Diabetes Program Recommendations  AACE/ADA: New Consensus Statement on Inpatient Glycemic Control (2015)  Target Ranges:  Prepandial:   less than 140 mg/dL      Peak postprandial:   less than 180 mg/dL (1-2 hours)      Critically ill patients:  140 - 180 mg/dL   Lab Results  Component Value Date   GLUCAP 297 (H) 07/12/2017    Review of Glycemic ControlResults for Marissa JacobsonJARRELL, Annis J (MRN [id number]) as of 07/12/2017 15:38  Ref. Range 07/12/2017 11:36  Glucose-Capillary Latest Ref Range: 65 - 99 mg/dL 782297 (H)   Diabetes history: None Outpatient Diabetes medications: None Current orders for Inpatient glycemic control:  Novolog sensitive tid with meals and HS- Just added Solumedrol 60 mg IV q 6 hours Inpatient Diabetes Program Recommendations:    Note elevated CBG's with steroids.  Will follow.  May need basal insulin while on steroids if fasting CBG's greater than 150 mg/dL.   Thanks  Beryl MeagerJenny Kattleya Kuhnert, RN, BC-ADM Inpatient Diabetes Coordinator Pager [phone] (8a-5p)

## 2017-07-12 NOTE — Progress Notes (Signed)
PROGRESS NOTE    Marissa Owens  ZOX:[id number] DOB: 1961/04/24 DOA: 07/11/2017 PCP: Patient, No Pcp Per   Outpatient Specialists:     Brief Narrative:  57 y.o. female with PMH of HTN, CKD, Tobacco use, COPD presented with progressively worsening shortness of breath, associated with productive cough with yellow sputum.     Assessment & Plan:   Active Problems:   COPD exacerbation (HCC)   Acute hypoxic respiratory failure due to Acute COPD exacerbation.  - continues to smoke. -IV steroids -nebs - azithromycin -wean O2 as able- -currently 92% on 2L at rest -outpatient PFTs -flu swab negative but no NP swab done -mucinex  Hypokalemia.  -replaced   HTN -monitor  Tobacco abuse -encouraged cessation  Hyperglycemia -due to steroids -SSI    DVT prophylaxis:  Lovenox   Code Status: Full Code   Family Communication:   Disposition Plan:  Once able to wean off O2   Consultants:    Subjective: Still with cough  Objective: Vitals:   07/11/17 2059 07/12/17 0505 07/12/17 0817 07/12/17 1134  BP: (!) 101/59 113/74    Pulse: 84 84    Resp: 20 20    Temp: 98.1 F (36.7 C) 97.9 F (36.6 C)    TempSrc: Oral Oral    SpO2: 95% 93% 93% 92%  Weight:      Height:        Intake/Output Summary (Last 24 hours) at 07/12/2017 1315 Last data filed at 07/12/2017 8119 Gross per 24 hour  Intake 150 ml  Output -  Net 150 ml   Filed Weights   07/11/17 0051  Weight: 68 kg (150 lb)    Examination:  General exam: on 2L O2  Respiratory system: no wheezing but not moving much air Cardiovascular system: rrr Gastrointestinal system:+BS, soft Central nervous system: A+Ox3 Extremities: Symmetric 5 x 5 power. Skin: No rashes, lesions or ulcers Psychiatry: Judgement and insight appear normal. Mood & affect appropriate.     Data Reviewed: I have personally reviewed following labs and imaging studies  CBC: Recent Labs  Lab 07/11/17 0203  WBC 7.2    NEUTROABS 3.1  HGB 14.1  HCT 41.3  MCV 94.5  PLT 151   Basic Metabolic Panel: Recent Labs  Lab 07/11/17 0203 07/12/17 0615  NA 135 139  K 3.3* 4.8  CL 99* 101  CO2 29 26  GLUCOSE 137* 215*  BUN 5* 18  CREATININE 0.67 0.90  CALCIUM 9.0 9.5   GFR: Estimated Creatinine Clearance: 62.4 mL/min (by C-G formula based on SCr of 0.9 mg/dL). Liver Function Tests: No results for input(s): AST, ALT, ALKPHOS, BILITOT, PROT, ALBUMIN in the last 168 hours. No results for input(s): LIPASE, AMYLASE in the last 168 hours. No results for input(s): AMMONIA in the last 168 hours. Coagulation Profile: No results for input(s): INR, PROTIME in the last 168 hours. Cardiac Enzymes: No results for input(s): CKTOTAL, CKMB, CKMBINDEX, TROPONINI in the last 168 hours. BNP (last 3 results) No results for input(s): PROBNP in the last 8760 hours. HbA1C: No results for input(s): HGBA1C in the last 72 hours. CBG: Recent Labs  Lab 07/12/17 1136  GLUCAP 297*   Lipid Profile: No results for input(s): CHOL, HDL, LDLCALC, TRIG, CHOLHDL, LDLDIRECT in the last 72 hours. Thyroid Function Tests: No results for input(s): TSH, T4TOTAL, FREET4, T3FREE, THYROIDAB in the last 72 hours. Anemia Panel: No results for input(s): VITAMINB12, FOLATE, FERRITIN, TIBC, IRON, RETICCTPCT in the last 72 hours. Urine analysis:  Component Value Date/Time   COLORURINE YELLOW 03/06/2017 1247   APPEARANCEUR CLEAR 03/06/2017 1247   LABSPEC 1.010 03/06/2017 1247   PHURINE 7.0 03/06/2017 1247   GLUCOSEU NEGATIVE 03/06/2017 1247   HGBUR SMALL (A) 03/06/2017 1247   BILIRUBINUR NEGATIVE 03/06/2017 1247   KETONESUR NEGATIVE 03/06/2017 1247   PROTEINUR NEGATIVE 03/06/2017 1247   UROBILINOGEN 1.0 05/06/2014 1143   NITRITE NEGATIVE 03/06/2017 1247   LEUKOCYTESUR NEGATIVE 03/06/2017 1247     )No results found for this or any previous visit (from the past 240 hour(s)).    Anti-infectives (From admission, onward)   Start      Dose/Rate Route Frequency Ordered Stop   07/12/17 1000  azithromycin (ZITHROMAX) tablet 250 mg     250 mg Oral Daily 07/11/17 0812 07/16/17 0959   07/11/17 1000  azithromycin (ZITHROMAX) tablet 500 mg     500 mg Oral Daily 07/11/17 [id number] 07/11/17 0938       Radiology Studies: Dg Chest 2 View  Result Date: 07/11/2017 CLINICAL DATA:  Shortness of breath.  Fever and chills. EXAM: CHEST  2 VIEW COMPARISON:  01/30/2014 FINDINGS: The cardiomediastinal contours are normal. Moderate bronchial thickening. Pulmonary vasculature is normal. No consolidation, pleural effusion, or pneumothorax. No acute osseous abnormalities are seen. IMPRESSION: Moderate bronchial thickening without focal airspace disease. Electronically Signed   By: Rubye OaksMelanie  Ehinger M.D.   On: 07/11/2017 01:47        Scheduled Meds: . amLODipine  10 mg Oral Daily  . azithromycin  250 mg Oral Daily  . dextromethorphan-guaiFENesin  1 tablet Oral BID  . divalproex  500 mg Oral BID  . enoxaparin (LOVENOX) injection  40 mg Subcutaneous Q24H  . FLUoxetine  60 mg Oral Daily  . insulin aspart  0-5 Units Subcutaneous QHS  . insulin aspart  0-9 Units Subcutaneous TID WC  . ipratropium-albuterol  3 mL Nebulization QID  . Melatonin  10 mg Oral QHS  . methylPREDNISolone (SOLU-MEDROL) injection  60 mg Intravenous Q6H  . pneumococcal 23 valent vaccine  0.5 mL Intramuscular Tomorrow-1000  . risperiDONE  2 mg Oral BID   Continuous Infusions:   LOS: 0 days    Time spent: 35 min    Joseph ArtJessica U Modupe Shampine, DO [facility] Pager [phone]  If 7PM-7AM, please contact night-coverage [email or web address] Password [email or web address] 07/12/2017, 1:15 PM

## 2017-07-13 DIAGNOSIS — J9600 Acute respiratory failure, unspecified whether with hypoxia or hypercapnia: Secondary | ICD-10-CM

## 2017-07-13 DIAGNOSIS — R739 Hyperglycemia, unspecified: Secondary | ICD-10-CM

## 2017-07-13 DIAGNOSIS — J9601 Acute respiratory failure with hypoxia: Secondary | ICD-10-CM

## 2017-07-13 DIAGNOSIS — J441 Chronic obstructive pulmonary disease with (acute) exacerbation: Principal | ICD-10-CM

## 2017-07-13 DIAGNOSIS — Z720 Tobacco use: Secondary | ICD-10-CM

## 2017-07-13 LAB — GLUCOSE, CAPILLARY
Glucose-Capillary: 179 mg/dL — ABNORMAL HIGH (ref 65–99)
Glucose-Capillary: 258 mg/dL — ABNORMAL HIGH (ref 65–99)

## 2017-07-13 MED ORDER — AZITHROMYCIN 250 MG PO TABS: ORAL_TABLET | ORAL | 0 refills | Status: DC

## 2017-07-13 MED ORDER — TIOTROPIUM BROMIDE MONOHYDRATE 2.5 MCG/ACT IN AERS: 2.0000 | INHALATION_SPRAY | Freq: Every day | RESPIRATORY_TRACT | 1 refills | Status: DC

## 2017-07-13 MED ORDER — METHYLPREDNISOLONE SODIUM SUCC 125 MG IJ SOLR: 60.0000 mg | Freq: Two times a day (BID) | INTRAMUSCULAR | Status: DC

## 2017-07-13 MED ORDER — NICOTINE 14 MG/24HR TD PT24: 14.0000 mg | MEDICATED_PATCH | TRANSDERMAL | 0 refills | Status: AC

## 2017-07-13 MED ORDER — METFORMIN HCL 500 MG PO TABS: 500.0000 mg | ORAL_TABLET | Freq: Every day | ORAL | Status: DC

## 2017-07-13 MED ORDER — METFORMIN HCL 500 MG PO TABS: 500.0000 mg | ORAL_TABLET | Freq: Every day | ORAL | 0 refills | Status: DC

## 2017-07-13 MED ORDER — DM-GUAIFENESIN ER 30-600 MG PO TB12: 1.0000 | ORAL_TABLET | Freq: Two times a day (BID) | ORAL | 0 refills | Status: DC | PRN

## 2017-07-13 MED ORDER — BENZONATATE 200 MG PO CAPS: 200.0000 mg | ORAL_CAPSULE | Freq: Two times a day (BID) | ORAL | 0 refills | Status: DC | PRN

## 2017-07-13 MED ORDER — PREDNISONE 50 MG PO TABS: 50.0000 mg | ORAL_TABLET | Freq: Every day | ORAL | Status: DC

## 2017-07-13 MED ORDER — COMPRESSOR/NEBULIZER MISC: 0 refills | Status: DC

## 2017-07-13 MED ORDER — IPRATROPIUM-ALBUTEROL 0.5-2.5 (3) MG/3ML IN SOLN: 3.0000 mL | RESPIRATORY_TRACT | 0 refills | Status: DC | PRN

## 2017-07-13 MED ORDER — PREDNISONE 10 MG PO TABS: ORAL_TABLET | ORAL | 0 refills | Status: DC

## 2017-07-13 NOTE — Progress Notes (Signed)
Spoke with pt concerning purchasing medication. Pt states that she is able to purchase medications with her insurance card.  Pt did not have card with her.

## 2017-07-13 NOTE — Progress Notes (Signed)
07/13/17  1700  Reviewed discharge instructions with patient. Patient verbalized understanding of discharge instructions. Copy of discharge instructions and prescriptions given to patient.

## 2017-07-13 NOTE — Discharge Summary (Signed)
Physician Discharge Summary  Marissa Owens:[id number] DOB: Jul 22, 1960 DOA: 07/11/2017  PCP: Patient, No Pcp Per  Admit date: 07/11/2017 Discharge date: 07/13/2017   Recommendations for Outpatient Follow-Up:   1. Smoking cessation 2. Monitor blood sugars-- metformin started while on steroids 3. Outpatient PFTs   Discharge Diagnosis:   Active Problems:   COPD exacerbation (HCC)   Acute respiratory failure (HCC)   Tobacco abuse   Discharge disposition:  Home.  Discharge Condition: Improved.  Diet recommendation: Low sodium, heart healthy.  Carbohydrate-modified  Wound care: None.   History of Present Illness:   Marissa Owens is a 57 y.o. female with PMH of HTN, CKD, Tobacco use, COPD presented with progressively worsening shortness of breath, associated with productive cough with yellow sputum. Wheezing for 1 week. She reports chills, no fever. She denies acute chest pains. No sick contacts. She continues to smoke 1PPD. She has DOE. No PNDs, no orthopnea, no leg edema. No GI symptoms. She reports arthritis related back pains. No focal neuro symptoms   Review of Systems: The patient denies anorexia, fever, weight loss,, vision loss, decreased hearing, hoarseness, chest pain, syncope, dyspnea on exertion, peripheral edema, balance deficits, hemoptysis, abdominal pain, melena, hematochezia, severe indigestion/heartburn, hematuria, incontinence, genital sores, muscle weakness, suspicious skin lesions, transient blindness, difficulty walking, depression, unusual weight change, abnormal bleeding, enlarged lymph nodes, angioedema, and breast masses.      Hospital Course by Problem:   Acute hypoxic respiratory failure due to Acute COPD exacerbation.  -continues to smoke. -IV steroids weaned to PO -nebs and inhalers - azithromycin PO x 5 days -weaned offO2 -outpatient PFTs -flu swab negative but no NP swab done -mucinex  Hypokalemia.  -replaced    HTN -stable  Tobacco abuse -encouraged cessation  Hyperglycemia -due to steroids -metformin while on steroids -close outpatient follow up      Medical Consultants:    None.   Discharge Exam:   Vitals:   07/13/17 1130 07/13/17 1132  BP:    Pulse:    Resp:    Temp:    SpO2: 91% 97%   Vitals:   07/13/17 0737 07/13/17 0957 07/13/17 1130 07/13/17 1132  BP:  123/74    Pulse:      Resp:  20    Temp:      TempSrc:      SpO2: 95% 94% 91% 97%  Weight:      Height:        Gen:  NAD    The results of significant diagnostics from this hospitalization (including imaging, microbiology, ancillary and laboratory) are listed below for reference.     Procedures and Diagnostic Studies:   Dg Chest 2 View  Result Date: 07/11/2017 CLINICAL DATA:  Shortness of breath.  Fever and chills. EXAM: CHEST  2 VIEW COMPARISON:  01/30/2014 FINDINGS: The cardiomediastinal contours are normal. Moderate bronchial thickening. Pulmonary vasculature is normal. No consolidation, pleural effusion, or pneumothorax. No acute osseous abnormalities are seen. IMPRESSION: Moderate bronchial thickening without focal airspace disease. Electronically Signed   By: Rubye Oaks M.D.   On: 07/11/2017 01:47     Labs:   Basic Metabolic Panel: Recent Labs  Lab 07/11/17 0203 07/12/17 0615  NA 135 139  K 3.3* 4.8  CL 99* 101  CO2 29 26  GLUCOSE 137* 215*  BUN 5* 18  CREATININE 0.67 0.90  CALCIUM 9.0 9.5   GFR Estimated Creatinine Clearance: 62.4 mL/min (by C-G formula based on SCr of 0.9 mg/dL). Liver  Function Tests: No results for input(s): AST, ALT, ALKPHOS, BILITOT, PROT, ALBUMIN in the last 168 hours. No results for input(s): LIPASE, AMYLASE in the last 168 hours. No results for input(s): AMMONIA in the last 168 hours. Coagulation profile No results for input(s): INR, PROTIME in the last 168 hours.  CBC: Recent Labs  Lab 07/11/17 0203  WBC 7.2  NEUTROABS 3.1  HGB 14.1   HCT 41.3  MCV 94.5  PLT 151   Cardiac Enzymes: No results for input(s): CKTOTAL, CKMB, CKMBINDEX, TROPONINI in the last 168 hours. BNP: Invalid input(s): POCBNP CBG: Recent Labs  Lab 07/12/17 1136 07/12/17 1636 07/12/17 2137 07/13/17 0738 07/13/17 1146  GLUCAP 297* 300* 230* 179* 258*   D-Dimer Recent Labs    07/11/17 0619  DDIMER 0.35   Hgb A1c No results for input(s): HGBA1C in the last 72 hours. Lipid Profile No results for input(s): CHOL, HDL, LDLCALC, TRIG, CHOLHDL, LDLDIRECT in the last 72 hours. Thyroid function studies No results for input(s): TSH, T4TOTAL, T3FREE, THYROIDAB in the last 72 hours.  Invalid input(s): FREET3 Anemia work up No results for input(s): VITAMINB12, FOLATE, FERRITIN, TIBC, IRON, RETICCTPCT in the last 72 hours. Microbiology No results found for this or any previous visit (from the past 240 hour(s)).   Discharge Instructions:   Discharge Instructions    Diet - low sodium heart healthy   Complete by:  As directed    Diet Carb Modified   Complete by:  As directed    Discharge instructions   Complete by:  As directed    Your blood sugars with be higher on steroids-- have started metformin once daily for this. Stop smoking Need to establish with PCP and [facility]   Increase activity slowly   Complete by:  As directed      Allergies as of 07/13/2017   No Known Allergies     Medication List    TAKE these medications   albuterol 108 (90 Base) MCG/ACT inhaler Commonly known as:  PROVENTIL HFA;VENTOLIN HFA Inhale 2 puffs into the lungs every 6 (six) hours as needed for wheezing or shortness of breath.   amLODipine 10 MG tablet Commonly known as:  NORVASC Take 1 tablet (10 mg total) by mouth daily.   azithromycin 250 MG tablet Commonly known as:  ZITHROMAX Take one daily Start taking on:  07/14/2017   benzonatate 200 MG capsule Commonly known as:  TESSALON Take 1 capsule (200 mg total) by mouth 2  (two) times daily as needed for cough.   dextromethorphan-guaiFENesin 30-600 MG 12hr tablet Commonly known as:  MUCINEX DM Take 1 tablet by mouth 2 (two) times daily as needed for cough.   divalproex 500 MG DR tablet Commonly known as:  DEPAKOTE Take 500 mg by mouth 2 (two) times daily.   FLUoxetine 20 MG tablet Commonly known as:  PROZAC Take 60 mg by mouth daily.   ipratropium-albuterol 0.5-2.5 (3) MG/3ML Soln Commonly known as:  DUONEB Take 3 mLs by nebulization every 4 (four) hours as needed (SOB/wheezing).   Melatonin 10 MG Tabs Take 10 mg by mouth at bedtime.   metFORMIN 500 MG tablet Commonly known as:  GLUCOPHAGE Take 1 tablet (500 mg total) by mouth daily with breakfast. Start taking on:  07/14/2017   predniSONE 10 MG tablet Commonly known as:  DELTASONE 50 mg x 2 days then 40 mg x 2 days then 30 mg x 2 days then 20 mg x 2 days then  10 mg x 2 days then stop Start taking on:  07/14/2017   risperiDONE 2 MG tablet Commonly known as:  RISPERDAL Take 2 mg by mouth 2 (two) times daily.   Tiotropium Bromide Monohydrate 2.5 MCG/ACT Aers Commonly known as:  SPIRIVA RESPIMAT Inhale 2 puffs into the lungs daily.            Durable Medical Equipment  (From admission, onward)        Start     Ordered   07/13/17 1148  For home use only DME Nebulizer/meds  Once    Question:  Patient needs a nebulizer to treat with the following condition  Answer:  COPD (chronic obstructive pulmonary disease) (HCC)   07/13/17 1147     Follow-up Information    establish with PCP Follow up.            Time coordinating discharge: 35 min  Signed:  Joseph Art   [facility] 07/13/2017, 3:05 PM

## 2017-07-13 NOTE — Plan of Care (Signed)
  Pain Managment: General experience of comfort will improve 07/13/2017 1638 - Progressing by William Daltonarpenter, [employer] Te[facility] L, RN   Elimination: Will not experience complications related to bowel motility 07/13/2017 1638 - Progressing by William Daltonarpenter, Anaisa Radi L, RN   Skin Integrity: Risk for impaired skin integrity will decrease 07/13/2017 1638 - Progressing by William Daltonarpenter, [place] Pak L, RN

## 2017-07-28 ENCOUNTER — Ambulatory Visit: Payer: Self-pay | Admitting: Family Medicine

## 2017-08-02 ENCOUNTER — Ambulatory Visit: Payer: Self-pay | Admitting: Family Medicine

## 2017-08-02 NOTE — Progress Notes (Deleted)
No chief complaint on file.   HPI  4 review of systems  Past Medical History:  Diagnosis Date  . COPD (chronic obstructive pulmonary disease) (HCC)   . Hypertension   . Renal disorder    kidney stone    Current Outpatient Medications  Medication Sig Dispense Refill  . albuterol (PROVENTIL HFA;VENTOLIN HFA) 108 (90 Base) MCG/ACT inhaler Inhale 2 puffs into the lungs every 6 (six) hours as needed for wheezing or shortness of breath. 1 Inhaler 2  . amLODipine (NORVASC) 10 MG tablet Take 1 tablet (10 mg total) by mouth daily. 90 tablet 1  . azithromycin (ZITHROMAX) 250 MG tablet Take one daily 4 each 0  . benzonatate (TESSALON) 200 MG capsule Take 1 capsule (200 mg total) by mouth 2 (two) times daily as needed for cough. 20 capsule 0  . dextromethorphan-guaiFENesin (MUCINEX DM) 30-600 MG 12hr tablet Take 1 tablet by mouth 2 (two) times daily as needed for cough. 20 tablet 0  . divalproex (DEPAKOTE) 500 MG DR tablet Take 500 mg by mouth 2 (two) times daily.     Marland Kitchen FLUoxetine (PROZAC) 20 MG tablet Take 60 mg by mouth daily.    Marland Kitchen ipratropium-albuterol (DUONEB) 0.5-2.5 (3) MG/3ML SOLN Take 3 mLs by nebulization every 4 (four) hours as needed (SOB/wheezing). 360 mL 0  . Melatonin 10 MG TABS Take 10 mg by mouth at bedtime.    . metFORMIN (GLUCOPHAGE) 500 MG tablet Take 1 tablet (500 mg total) by mouth daily with breakfast. 30 tablet 0  . Nebulizers (COMPRESSOR/NEBULIZER) MISC 1 machine Dx : COPD 1 each 0  . nicotine (NICODERM CQ - DOSED IN MG/24 HOURS) 14 mg/24hr patch Place 1 patch (14 mg total) onto the skin daily. 30 patch 0  . predniSONE (DELTASONE) 10 MG tablet 50 mg x 2 days then 40 mg x 2 days then 30 mg x 2 days then 20 mg x 2 days then 10 mg x 2 days then stop 30 tablet 0  . risperiDONE (RISPERDAL) 2 MG tablet Take 2 mg by mouth 2 (two) times daily.    . Tiotropium Bromide Monohydrate (SPIRIVA RESPIMAT) 2.5 MCG/ACT AERS Inhale 2 puffs into the lungs daily. 1 Inhaler 1   No  current facility-administered medications for this visit.     Allergies: No Known Allergies  Past Surgical History:  Procedure Laterality Date  . TUBAL LIGATION      Social History   Socioeconomic History  . Marital status: Single    Spouse name: Not on file  . Number of children: Not on file  . Years of education: Not on file  . Highest education level: Not on file  Social Needs  . Financial resource strain: Not on file  . Food insecurity - worry: Not on file  . Food insecurity - inability: Not on file  . Transportation needs - medical: Not on file  . Transportation needs - non-medical: Not on file  Occupational History  . Not on file  Tobacco Use  . Smoking status: Current Every Day Smoker    Packs/day: 1.00    Types: Cigarettes  . Smokeless tobacco: Never Used  Substance and Sexual Activity  . Alcohol use: No    Frequency: Never  . Drug use: No  . Sexual activity: Not on file  Other Topics Concern  . Not on file  Social History Narrative  . Not on file    No family history on file.   ROS  Review of Systems See HPI Constitution: No fevers or chills No malaise No diaphoresis Skin: No rash or itching Eyes: no blurry vision, no double vision GU: no dysuria or hematuria Neuro: no dizziness or headaches * all others reviewed and negative   Objective: There were no vitals filed for this visit.  Physical Exam  Assessment and Plan There are no diagnoses linked to this encounter.   Marissa Owens [employer]

## 2018-02-23 ENCOUNTER — Encounter (HOSPITAL_COMMUNITY): Payer: Self-pay

## 2018-02-23 ENCOUNTER — Ambulatory Visit (HOSPITAL_COMMUNITY)
Admission: EM | Admit: 2018-02-23 | Discharge: 2018-02-23 | Disposition: A | Discharge status: Home / Self Care | Payer: Self-pay | Attending: Family Medicine | Admitting: Family Medicine

## 2018-02-23 DIAGNOSIS — R0789 Other chest pain: Secondary | ICD-10-CM

## 2018-02-23 DIAGNOSIS — J441 Chronic obstructive pulmonary disease with (acute) exacerbation: Secondary | ICD-10-CM

## 2018-02-23 DIAGNOSIS — R600 Localized edema: Secondary | ICD-10-CM

## 2018-02-23 DIAGNOSIS — I10 Essential (primary) hypertension: Secondary | ICD-10-CM

## 2018-02-23 DIAGNOSIS — R0602 Shortness of breath: Secondary | ICD-10-CM

## 2018-02-23 LAB — POCT I-STAT, CHEM 8
BUN: 5 mg/dL — AB (ref 6–20)
CREATININE: 0.9 mg/dL (ref 0.44–1.00)
Calcium, Ion: 1.2 mmol/L (ref 1.15–1.40)
Chloride: 102 mmol/L (ref 98–111)
GLUCOSE: 86 mg/dL (ref 70–99)
HCT: 48 % — ABNORMAL HIGH (ref 36.0–46.0)
HEMOGLOBIN: 16.3 g/dL — AB (ref 12.0–15.0)
Potassium: 3.9 mmol/L (ref 3.5–5.1)
Sodium: 142 mmol/L (ref 135–145)
TCO2: 32 mmol/L (ref 22–32)

## 2018-02-23 MED ORDER — FUROSEMIDE 20 MG PO TABS: 20.0000 mg | ORAL_TABLET | Freq: Every day | ORAL | 0 refills | Status: DC

## 2018-02-23 MED ORDER — IPRATROPIUM-ALBUTEROL 0.5-2.5 (3) MG/3ML IN SOLN
3.0000 mL | Freq: Once | RESPIRATORY_TRACT | Status: AC
  Administered 2018-02-23: 3 mL via RESPIRATORY_TRACT

## 2018-02-23 MED ORDER — ALBUTEROL SULFATE HFA 108 (90 BASE) MCG/ACT IN AERS: 1.0000 | INHALATION_SPRAY | Freq: Four times a day (QID) | RESPIRATORY_TRACT | 0 refills | Status: DC | PRN

## 2018-02-23 MED ORDER — PREDNISONE 10 MG (21) PO TBPK: ORAL_TABLET | ORAL | 0 refills | Status: DC

## 2018-02-23 MED ORDER — IPRATROPIUM-ALBUTEROL 0.5-2.5 (3) MG/3ML IN SOLN
RESPIRATORY_TRACT | Status: AC
  Filled 2018-02-23: qty 3

## 2018-02-23 MED ORDER — AMLODIPINE BESYLATE 10 MG PO TABS: 10.0000 mg | ORAL_TABLET | Freq: Every day | ORAL | 2 refills | Status: DC

## 2018-02-23 NOTE — Discharge Instructions (Addendum)
It was nice meeting you!!  Your EKG was normal Your blood work did not show any abnormalities. We gave you a breathing treatment here in clinic to help with the wheezing and shortness of breath. I am going to refill your blood pressure medication and send you home with prednisone and an albuterol inhaler. I am also going to give you something for the swelling in your legs.  Try to elevate your legs much as possible.  Compression stockings could help with the swelling Try to follow-up with a primary care provider within the next month or so

## 2018-02-23 NOTE — ED Triage Notes (Signed)
Pt presents with shortness of breath, elevated blood pressure, and swelling in legs and feet.  Pt states she has been out of her medication for her blood pressure.

## 2018-02-23 NOTE — ED Provider Notes (Signed)
[facility]    CSN: [id number] Arrival date & time: 02/23/18  1035     History   Chief Complaint Chief Complaint  Patient presents with  . Shortness of Breath  . Hypertension  . Swelling of Legs and Feet    HPI Marissa Owens is a 57 y.o. female.   Patient is a 57 year old female past medical history of COPD, hypertension, renal disorder, acute respiratory failure.  She presents with 2 weeks of bilateral lower extremity swelling that has worsened.  She is also complaining of increasing shortness of breath x1 week with coughing, congestion.  She is out of all her medication she does not have any of her inhalers or blood pressure medication.  She has not had medicine in over 3 weeks.  Reports that her blood pressures have been increasing and she does experience some intermittent chest pain and blurred vision.  She currently does not have a primary care physician due to lack of insurance.  She denies any fever, chills, body aches.  She reports last time she was in the ER she was diagnosed with diabetes and started on medication and she has not been taking that either.   ROS per HPI      Past Medical History:  Diagnosis Date  . COPD (chronic obstructive pulmonary disease) (HCC)   . Hypertension   . Renal disorder    kidney stone    Patient Active Problem List   Diagnosis Date Noted  . Acute respiratory failure (HCC) 07/13/2017  . Tobacco abuse 07/13/2017  . COPD exacerbation (HCC) 07/11/2017    Past Surgical History:  Procedure Laterality Date  . TUBAL LIGATION      OB History   None      Home Medications    Prior to Admission medications   Medication Sig Start Date End Date Taking? Authorizing Provider  albuterol (PROVENTIL HFA;VENTOLIN HFA) 108 (90 Base) MCG/ACT inhaler Inhale 1-2 puffs into the lungs every 6 (six) hours as needed for wheezing or shortness of breath. 02/23/18   Lleyton Byers, Gloris Manchester A, NP  amLODipine (NORVASC) 10 MG tablet Take 1 tablet (10  mg total) by mouth daily. 02/23/18 05/24/18  Janace Aris, NP  azithromycin (ZITHROMAX) 250 MG tablet Take one daily 07/14/17   Marlin Canary U, DO  benzonatate (TESSALON) 200 MG capsule Take 1 capsule (200 mg total) by mouth 2 (two) times daily as needed for cough. 07/13/17   Joseph Art, DO  dextromethorphan-guaiFENesin (MUCINEX DM) 30-600 MG 12hr tablet Take 1 tablet by mouth 2 (two) times daily as needed for cough. 07/13/17   Joseph Art, DO  divalproex (DEPAKOTE) 500 MG DR tablet Take 500 mg by mouth 2 (two) times daily.     [provider]  FLUoxetine (PROZAC) 20 MG tablet Take 60 mg by mouth daily.    [provider]  furosemide (LASIX) 20 MG tablet Take 1 tablet (20 mg total) by mouth daily for 5 days. 02/23/18 02/28/18  Dahlia Byes A, NP  Melatonin 10 MG TABS Take 10 mg by mouth at bedtime.    [provider]  metFORMIN (GLUCOPHAGE) 500 MG tablet Take 1 tablet (500 mg total) by mouth daily with breakfast. 07/14/17   Joseph Art, DO  Nebulizers (COMPRESSOR/NEBULIZER) MISC 1 machine Dx : COPD 07/13/17   Joseph Art, DO  nicotine (NICODERM CQ - DOSED IN MG/24 HOURS) 14 mg/24hr patch Place 1 patch (14 mg total) onto the skin daily. 07/13/17 07/13/18  Joseph Art, DO  predniSONE (STERAPRED UNI-PAK 21 TAB) 10 MG (21) TBPK tablet 6 tabs for 1 day, then 5 tabs for 1 das, then 4 tabs for 1 day, then 3 tabs for 1 day, 2 tabs for 1 day, then 1 tab for 1 day 02/23/18   Dahlia Byes A, NP  risperiDONE (RISPERDAL) 2 MG tablet Take 2 mg by mouth 2 (two) times daily.    [provider]  Tiotropium Bromide Monohydrate (SPIRIVA RESPIMAT) 2.5 MCG/ACT AERS Inhale 2 puffs into the lungs daily. 07/13/17 08/12/17  Joseph Art, DO    Family History History reviewed. No pertinent family history.  Social History Social History   Tobacco Use  . Smoking status: Current Every Day Smoker    Packs/day: 1.00    Types: Cigarettes  . Smokeless tobacco: Never Used    Substance Use Topics  . Alcohol use: No    Frequency: Never  . Drug use: No     Allergies   Patient has no known allergies.   Review of Systems Review of Systems  Constitutional: Positive for activity change and fatigue.  HENT: Positive for congestion.   Respiratory: Positive for cough, chest tightness, shortness of breath and wheezing.   Cardiovascular: Positive for chest pain and leg swelling. Negative for palpitations.  Skin: Negative for color change and pallor.  Neurological: Negative for dizziness, syncope, facial asymmetry, speech difficulty, weakness, light-headedness, numbness and headaches.  Hematological: Does not bruise/bleed easily.     Physical Exam Triage Vital Signs ED Triage Vitals [02/23/18 1149]  Enc Vitals Group     BP (!) 147/101     Pulse Rate 88     Resp 18     Temp 98.2 F (36.8 C)     Temp Source Oral     SpO2 95 %     Weight      Height      Head Circumference      Peak Flow      Pain Score      Pain Loc      Pain Edu?      Excl. in GC?    No data found.  Updated Vital Signs BP (!) 147/101 (BP Location: Left Arm)   Pulse 88   Temp 98.2 F (36.8 C) (Oral)   Resp 18   SpO2 95%   Visual Acuity Right Eye Distance:   Left Eye Distance:   Bilateral Distance:    Right Eye Near:   Left Eye Near:    Bilateral Near:     Physical Exam  Constitutional: She is oriented to person, place, and time. She appears well-developed and well-nourished. No distress.  Very pleasant.    HENT:  Head: Normocephalic and atraumatic.  Neck: Normal range of motion.  Cardiovascular: Normal rate and normal heart sounds.  No S3 heart sounds   Pulmonary/Chest: No accessory muscle usage. Tachypnea noted. No respiratory distress. She exhibits no tenderness.  Mild dyspnea with inspiratory and expiratory wheezing in all lung fields. No crackles.   Musculoskeletal: Normal range of motion.       Right lower leg: She exhibits edema. She exhibits no  tenderness.       Left lower leg: She exhibits edema. She exhibits no tenderness.  Bilateral dependent edema in lower extremities.  No calf tenderness, erythema   Neurological: She is alert and oriented to person, place, and time.  Skin: Skin is warm and dry. Capillary refill takes less than 2 seconds.  No ecchymosis and no rash noted. She is not diaphoretic. No cyanosis or erythema. No pallor. Nails show no clubbing.  Psychiatric: She has a normal mood and affect.  Nursing note and vitals reviewed.    UC Treatments / Results  Labs (all labs ordered are listed, but only abnormal results are displayed) Labs Reviewed  POCT I-STAT, CHEM 8 - Abnormal; Notable for the following components:      Result Value   BUN 5 (*)    Hemoglobin 16.3 (*)    HCT 48.0 (*)    All other components within normal limits    EKG None  Radiology No results found.  Procedures Procedures (including critical care time)  Medications Ordered in UC Medications  ipratropium-albuterol (DUONEB) 0.5-2.5 (3) MG/3ML nebulizer solution 3 mL (3 mLs Nebulization Given 02/23/18 1220)    Initial Impression / Assessment and Plan / UC Course  I have reviewed the triage vital signs and the nursing notes.  Pertinent labs & imaging results that were available during my care of the patient were reviewed by me and considered in my medical decision making (see chart for details).     EKG normal sinus rhythm and normal rate Duo neb in clinic. Lung sounds clear, but diminished in all fields after treatment.  Lab work normal- no decreased kidney function, glucose normal  No concern for CHF, PE, DVT COPD exacerbation and dependant edema- will treat with prednisone, inhaler  Lasix for edema Refilled BP medication Follow up as needed for continued or worsening symptoms For increased chest pain, SOB, edema please go to the ER  Final Clinical Impressions(s) / UC Diagnoses   Final diagnoses:  COPD exacerbation (HCC)    Bilateral leg edema     Discharge Instructions     It was nice meeting you!!  Your EKG was normal Your blood work did not show any abnormalities. We gave you a breathing treatment here in clinic to help with the wheezing and shortness of breath. I am going to refill your blood pressure medication and send you home with prednisone and an albuterol inhaler. I am also going to give you something for the swelling in your legs.  Try to elevate your legs much as possible.  Compression stockings could help with the swelling Try to follow-up with a primary care provider within the next month or so    ED Prescriptions    Medication Sig Dispense Auth. Provider   predniSONE (STERAPRED UNI-PAK 21 TAB) 10 MG (21) TBPK tablet 6 tabs for 1 day, then 5 tabs for 1 das, then 4 tabs for 1 day, then 3 tabs for 1 day, 2 tabs for 1 day, then 1 tab for 1 day 21 tablet Colbie Danner A, NP   albuterol (PROVENTIL HFA;VENTOLIN HFA) 108 (90 Base) MCG/ACT inhaler Inhale 1-2 puffs into the lungs every 6 (six) hours as needed for wheezing or shortness of breath. 1 Inhaler Abbigayle Toole A, NP   amLODipine (NORVASC) 10 MG tablet  (Status: Discontinued) Take 1 tablet (10 mg total) by mouth daily. 30 tablet Tyrone Pautsch A, NP   furosemide (LASIX) 20 MG tablet Take 1 tablet (20 mg total) by mouth daily for 5 days. 5 tablet Kindel Rochefort A, NP   amLODipine (NORVASC) 10 MG tablet Take 1 tablet (10 mg total) by mouth daily. 30 tablet Janace Aris, NP     Controlled Substance Prescriptions [place] Controlled Substance Registry consulted? no   Janace Aris, NP 02/23/18 2049

## 2018-04-04 ENCOUNTER — Ambulatory Visit (INDEPENDENT_AMBULATORY_CARE_PROVIDER_SITE_OTHER): Payer: Self-pay

## 2018-04-04 ENCOUNTER — Encounter (HOSPITAL_COMMUNITY): Payer: Self-pay | Admitting: Emergency Medicine

## 2018-04-04 ENCOUNTER — Other Ambulatory Visit: Payer: Self-pay

## 2018-04-04 ENCOUNTER — Ambulatory Visit (HOSPITAL_COMMUNITY)
Admission: EM | Admit: 2018-04-04 | Discharge: 2018-04-04 | Disposition: A | Discharge status: Home / Self Care | Payer: Self-pay | Attending: Family Medicine | Admitting: Family Medicine

## 2018-04-04 DIAGNOSIS — R609 Edema, unspecified: Secondary | ICD-10-CM

## 2018-04-04 DIAGNOSIS — R601 Generalized edema: Secondary | ICD-10-CM

## 2018-04-04 DIAGNOSIS — R05 Cough: Secondary | ICD-10-CM

## 2018-04-04 DIAGNOSIS — J810 Acute pulmonary edema: Secondary | ICD-10-CM

## 2018-04-04 DIAGNOSIS — R0602 Shortness of breath: Secondary | ICD-10-CM

## 2018-04-04 DIAGNOSIS — R062 Wheezing: Secondary | ICD-10-CM

## 2018-04-04 LAB — POCT I-STAT, CHEM 8
BUN: 7 mg/dL (ref 6–20)
CALCIUM ION: 1.15 mmol/L (ref 1.15–1.40)
Chloride: 101 mmol/L (ref 98–111)
Creatinine, Ser: 0.9 mg/dL (ref 0.44–1.00)
Glucose, Bld: 105 mg/dL — ABNORMAL HIGH (ref 70–99)
HCT: 47 % — ABNORMAL HIGH (ref 36.0–46.0)
HEMOGLOBIN: 16 g/dL — AB (ref 12.0–15.0)
POTASSIUM: 3.7 mmol/L (ref 3.5–5.1)
Sodium: 141 mmol/L (ref 135–145)
TCO2: 29 mmol/L (ref 22–32)

## 2018-04-04 MED ORDER — FUROSEMIDE 20 MG PO TABS: 20.0000 mg | ORAL_TABLET | Freq: Every day | ORAL | 0 refills | Status: DC

## 2018-04-04 MED ORDER — IPRATROPIUM-ALBUTEROL 0.5-2.5 (3) MG/3ML IN SOLN: 3.0000 mL | Freq: Four times a day (QID) | RESPIRATORY_TRACT | 0 refills | Status: DC | PRN

## 2018-04-04 MED ORDER — TIOTROPIUM BROMIDE MONOHYDRATE 2.5 MCG/ACT IN AERS: 2.0000 | INHALATION_SPRAY | Freq: Every day | RESPIRATORY_TRACT | 1 refills | Status: DC

## 2018-04-04 MED ORDER — IPRATROPIUM-ALBUTEROL 0.5-2.5 (3) MG/3ML IN SOLN
RESPIRATORY_TRACT | Status: AC
  Filled 2018-04-04: qty 3

## 2018-04-04 MED ORDER — IPRATROPIUM-ALBUTEROL 0.5-2.5 (3) MG/3ML IN SOLN
3.0000 mL | Freq: Once | RESPIRATORY_TRACT | Status: AC
  Administered 2018-04-04: 3 mL via RESPIRATORY_TRACT

## 2018-04-04 NOTE — ED Provider Notes (Signed)
[facility]    CSN: [id number] Arrival date & time: 04/04/18  1611     History   Chief Complaint No chief complaint on file.   HPI Marissa Owens is a 57 y.o. female.   Aleighya presents with complaints of non productive cough, shortness of breath, pain to right ribs with coughing, which has been worse over the past week. Also with some bilateral leg swelling, worse over the past two weeks. Was seen here 1 month ago with similar, states felt improved for at least a week. Swelling is not as severe as that episode. Has been using her inhaler which helps some. Has ran out of her other medications. Doesn't follow with a PCP as currently without insurance. Normal urination. Still smokes approximately 3 cigarettes a day. No fevers. Hx of COPD, htn, kidney stones. Has been on metformin the past, doesn't take this any longer however. No dizziness. No nausea or vomiting.     ROS per HPI.      Past Medical History:  Diagnosis Date  . COPD (chronic obstructive pulmonary disease) (HCC)   . Hypertension   . Renal disorder    kidney stone    Patient Active Problem List   Diagnosis Date Noted  . Acute respiratory failure (HCC) 07/13/2017  . Tobacco abuse 07/13/2017  . COPD exacerbation (HCC) 07/11/2017    Past Surgical History:  Procedure Laterality Date  . TUBAL LIGATION      OB History   None      Home Medications    Prior to Admission medications   Medication Sig Start Date End Date Taking? Authorizing Provider  amLODipine (NORVASC) 10 MG tablet Take 1 tablet (10 mg total) by mouth daily. 02/23/18 05/24/18  Dahlia Byes A, NP  dextromethorphan-guaiFENesin (MUCINEX DM) 30-600 MG 12hr tablet Take 1 tablet by mouth 2 (two) times daily as needed for cough. 07/13/17   Joseph Art, DO  divalproex (DEPAKOTE) 500 MG DR tablet Take 500 mg by mouth 2 (two) times daily.     [provider]  FLUoxetine (PROZAC) 20 MG tablet Take 60 mg by mouth daily.     [provider]  furosemide (LASIX) 20 MG tablet Take 1 tablet (20 mg total) by mouth daily for 5 days. 04/04/18 04/09/18  Linus Mako B, NP  ipratropium-albuterol (DUONEB) 0.5-2.5 (3) MG/3ML SOLN Take 3 mLs by nebulization every 6 (six) hours as needed. 04/04/18   Georgetta Haber, NP  Melatonin 10 MG TABS Take 10 mg by mouth at bedtime.    [provider]  metFORMIN (GLUCOPHAGE) 500 MG tablet Take 1 tablet (500 mg total) by mouth daily with breakfast. 07/14/17   Joseph Art, DO  Nebulizers (COMPRESSOR/NEBULIZER) MISC 1 machine Dx : COPD 07/13/17   Joseph Art, DO  nicotine (NICODERM CQ - DOSED IN MG/24 HOURS) 14 mg/24hr patch Place 1 patch (14 mg total) onto the skin daily. 07/13/17 07/13/18  Joseph Art, DO  risperiDONE (RISPERDAL) 2 MG tablet Take 2 mg by mouth 2 (two) times daily.    [provider]  Tiotropium Bromide Monohydrate (SPIRIVA RESPIMAT) 2.5 MCG/ACT AERS Inhale 2 puffs into the lungs daily. 04/04/18 05/04/18  Georgetta Haber, NP    Family History History reviewed. No pertinent family history.  Social History Social History   Tobacco Use  . Smoking status: Current Every Day Smoker    Packs/day: 1.00    Types: Cigarettes  . Smokeless tobacco: Never Used  Substance  Use Topics  . Alcohol use: No    Frequency: Never  . Drug use: No     Allergies   Patient has no known allergies.   Review of Systems Review of Systems   Physical Exam Triage Vital Signs ED Triage Vitals  Enc Vitals Group     BP 04/04/18 1653 121/78     Pulse Rate 04/04/18 1653 100     Resp 04/04/18 1653 20     Temp 04/04/18 1653 99 F (37.2 C)     Temp Source 04/04/18 1653 Oral     SpO2 04/04/18 1653 99 %     Weight --      Height --      Head Circumference --      Peak Flow --      Pain Score 04/04/18 1654 0     Pain Loc --      Pain Edu? --      Excl. in GC? --    No data found.  Updated Vital Signs BP 121/78 (BP Location: Left Arm)    Pulse 100   Temp 99 F (37.2 C) (Oral)   Resp 20   SpO2 99%    Physical Exam  Constitutional: She is oriented to person, place, and time. She appears well-developed and well-nourished. No distress.  Cardiovascular: Normal rate, regular rhythm and normal heart sounds.  +1 pitting edema to left ankle/ lower extremity; scant edema to right lower extremity   Pulmonary/Chest: Effort normal. No accessory muscle usage. No tachypnea. No respiratory distress. She has wheezes.  Scattered inspiratory wheezes noted, R>L; no increased work of breathing   Neurological: She is alert and oriented to person, place, and time.  Skin: Skin is warm and dry.     UC Treatments / Results  Labs (all labs ordered are listed, but only abnormal results are displayed) Labs Reviewed  POCT I-STAT, CHEM 8 - Abnormal; Notable for the following components:      Result Value   Glucose, Bld 105 (*)    Hemoglobin 16.0 (*)    HCT 47.0 (*)    All other components within normal limits    EKG None  Radiology Dg Chest 2 View  Result Date: 04/04/2018 CLINICAL DATA:  Pulmonary edema.  Wheezing. EXAM: CHEST - 2 VIEW COMPARISON:  07/11/2017 FINDINGS: There are mild, bilateral basilar predominant interstitial opacities. No pneumothorax or pleural effusion. No focal consolidation. Cardiomediastinal contours are normal. IMPRESSION: Mild interstitial pulmonary edema. Electronically Signed   By: Deatra Robinson M.D.   On: 04/04/2018 18:04    Procedures Procedures (including critical care time)  Medications Ordered in UC Medications  ipratropium-albuterol (DUONEB) 0.5-2.5 (3) MG/3ML nebulizer solution 3 mL (3 mLs Nebulization Given 04/04/18 1750)    Initial Impression / Assessment and Plan / UC Course  I have reviewed the triage vital signs and the nursing notes.  Pertinent labs & imaging results that were available during my care of the patient were reviewed by me and considered in my medical decision making (see  chart for details).     Patient in no distress tonight. No tachypnea, tachycardia, hypoxia. Mild peripheral edema, improved since last visit, with mild pulmonary edema on cxray. No known CHF, but has had elevated BNP in the past with hospitalization. Lasix provided with strict return precautions. Encouraged close follow up for recheck. Patient verbalized understanding and agreeable to plan.  Ambulatory out of clinic without difficulty.    Final Clinical Impressions(s) / UC Diagnoses  Final diagnoses:  SOB (shortness of breath)  Peripheral edema     Discharge Instructions     You have refills available for your blood pressure medication, please continue to take this.  5 days of lasix to help with breathing and with edema.  Please restart daily inhaler.  Use of albuterol or nebulizer every 6 hours as needed for wheezing or shortness of breath.  Use of compression stockings to bilateral lower extremities. Elevate the legs at the end of the day.  Please establish with a primary care provider for long term management of your chronic illness.  If develop worsening of shortness of breath , difficulty breathing, swelling, chest pain , or otherwise worsening please go to the Er.     ED Prescriptions    Medication Sig Dispense Auth. Provider   Tiotropium Bromide Monohydrate (SPIRIVA RESPIMAT) 2.5 MCG/ACT AERS Inhale 2 puffs into the lungs daily. 1 Inhaler Betsey Sossamon B, NP   ipratropium-albuterol (DUONEB) 0.5-2.5 (3) MG/3ML SOLN Take 3 mLs by nebulization every 6 (six) hours as needed. 360 mL Linus Mako B, NP   furosemide (LASIX) 20 MG tablet Take 1 tablet (20 mg total) by mouth daily for 5 days. 5 tablet Georgetta Haber, NP     Controlled Substance Prescriptions [place] Controlled Substance Registry consulted? Not Applicable   Georgetta Haber, NP 04/04/18 1815

## 2018-04-04 NOTE — Discharge Instructions (Addendum)
You have refills available for your blood pressure medication, please continue to take this.  5 days of lasix to help with breathing and with edema.  Please restart daily inhaler.  Use of albuterol or nebulizer every 6 hours as needed for wheezing or shortness of breath.  Use of compression stockings to bilateral lower extremities. Elevate the legs at the end of the day.  Please establish with a primary care provider for long term management of your chronic illness.  If develop worsening of shortness of breath , difficulty breathing, swelling, chest pain , or otherwise worsening please go to the Er.

## 2018-04-13 NOTE — Progress Notes (Deleted)
Patient ID: Marissa JacobsonJudith J Owens, female   DOB: October 30, 1960, 57 y.o.   MRN: [id number]  After being seen in the ED 04/04/2018.  From ED note: Marissa ClosJudith presents with complaints of non productive cough, shortness of breath, pain to right ribs with coughing, which has been worse over the past week. Also with some bilateral leg swelling, worse over the past two weeks. Was seen here 1 month ago with similar, states felt improved for at least a week. Swelling is not as severe as that episode. Has been using her inhaler which helps some. Has ran out of her other medications. Doesn't follow with a PCP as currently without insurance. Normal urination. Still smokes approximately 3 cigarettes a day. No fevers. Hx of COPD, htn, kidney stones. Has been on metformin the past, doesn't take this any longer however. No dizziness. No nausea or vomiting.   From A/P: Patient in no distress tonight. No tachypnea, tachycardia, hypoxia. Mild peripheral edema, improved since last visit, with mild pulmonary edema on cxray. No known CHF, but has had elevated BNP in the past with hospitalization. Lasix provided with strict return precautions. Encouraged close follow up for recheck. Patient verbalized understanding and agreeable to plan.  Ambulatory out of clinic without difficulty.

## 2018-04-14 ENCOUNTER — Ambulatory Visit: Payer: Self-pay | Attending: Family Medicine | Admitting: Physician Assistant

## 2018-04-14 ENCOUNTER — Other Ambulatory Visit: Payer: Self-pay | Admitting: Pharmacist

## 2018-04-14 VITALS — BP 136/97 | HR 86 | Temp 98.2°F | Resp 18 | Ht 62.0 in | Wt 175.0 lb

## 2018-04-14 DIAGNOSIS — Z79899 Other long term (current) drug therapy: Secondary | ICD-10-CM | POA: Insufficient documentation

## 2018-04-14 DIAGNOSIS — J441 Chronic obstructive pulmonary disease with (acute) exacerbation: Secondary | ICD-10-CM | POA: Insufficient documentation

## 2018-04-14 DIAGNOSIS — F32A Depression, unspecified: Secondary | ICD-10-CM

## 2018-04-14 DIAGNOSIS — I10 Essential (primary) hypertension: Secondary | ICD-10-CM | POA: Insufficient documentation

## 2018-04-14 DIAGNOSIS — E1165 Type 2 diabetes mellitus with hyperglycemia: Secondary | ICD-10-CM | POA: Insufficient documentation

## 2018-04-14 DIAGNOSIS — R609 Edema, unspecified: Secondary | ICD-10-CM | POA: Insufficient documentation

## 2018-04-14 DIAGNOSIS — Z7984 Long term (current) use of oral hypoglycemic drugs: Secondary | ICD-10-CM | POA: Insufficient documentation

## 2018-04-14 DIAGNOSIS — F329 Major depressive disorder, single episode, unspecified: Secondary | ICD-10-CM | POA: Insufficient documentation

## 2018-04-14 LAB — POCT GLYCOSYLATED HEMOGLOBIN (HGB A1C): HbA1c, POC (prediabetic range): 5.7 % (ref 5.7–6.4)

## 2018-04-14 LAB — GLUCOSE, POCT (MANUAL RESULT ENTRY): POC GLUCOSE: 148 mg/dL — AB (ref 70–99)

## 2018-04-14 MED ORDER — IPRATROPIUM-ALBUTEROL 0.5-2.5 (3) MG/3ML IN SOLN
3.0000 mL | Freq: Once | RESPIRATORY_TRACT | Status: AC
  Administered 2018-04-14: 3 mL via RESPIRATORY_TRACT

## 2018-04-14 MED ORDER — UMECLIDINIUM BROMIDE 62.5 MCG/INH IN AEPB: 1.0000 | INHALATION_SPRAY | Freq: Every day | RESPIRATORY_TRACT | 2 refills | Status: DC

## 2018-04-14 MED ORDER — AZITHROMYCIN 250 MG PO TABS: ORAL_TABLET | ORAL | 0 refills | Status: DC

## 2018-04-14 MED ORDER — METFORMIN HCL 500 MG PO TABS: 500.0000 mg | ORAL_TABLET | Freq: Every day | ORAL | 0 refills | Status: DC

## 2018-04-14 MED ORDER — METFORMIN HCL 500 MG PO TABS: 500.0000 mg | ORAL_TABLET | Freq: Every day | ORAL | 3 refills | Status: DC

## 2018-04-14 MED ORDER — IPRATROPIUM-ALBUTEROL 0.5-2.5 (3) MG/3ML IN SOLN: 3.0000 mL | Freq: Four times a day (QID) | RESPIRATORY_TRACT | 0 refills | Status: DC | PRN

## 2018-04-14 MED ORDER — FUROSEMIDE 20 MG PO TABS: 20.0000 mg | ORAL_TABLET | Freq: Every day | ORAL | 0 refills | Status: DC

## 2018-04-14 MED ORDER — ALBUTEROL SULFATE HFA 108 (90 BASE) MCG/ACT IN AERS: 2.0000 | INHALATION_SPRAY | Freq: Four times a day (QID) | RESPIRATORY_TRACT | 0 refills | Status: DC | PRN

## 2018-04-14 MED ORDER — FLUOXETINE HCL 20 MG PO TABS: 60.0000 mg | ORAL_TABLET | Freq: Every day | ORAL | 3 refills | Status: DC

## 2018-04-14 MED ORDER — AMLODIPINE BESYLATE 10 MG PO TABS: 10.0000 mg | ORAL_TABLET | Freq: Every day | ORAL | 2 refills | Status: DC

## 2018-04-14 MED ORDER — TIOTROPIUM BROMIDE MONOHYDRATE 2.5 MCG/ACT IN AERS: 2.0000 | INHALATION_SPRAY | Freq: Every day | RESPIRATORY_TRACT | 1 refills | Status: DC

## 2018-04-14 MED FILL — !INCRUSE ELLIPTA 62.5 MCG I: 62.5 | 30 days supply | Qty: 30 | Fill #0

## 2018-04-14 MED FILL — metFORMIN HCL 500 MG TABS: 500 | 30 days supply | Qty: 30 | Fill #0

## 2018-04-14 MED FILL — AZITHROMYCIN 250 MG TABLET: 250 | 5 days supply | Qty: 6 | Fill #0

## 2018-04-14 MED FILL — FLUoxetine HCL 20 MG TABS: 20 | 30 days supply | Qty: 90 | Fill #0

## 2018-04-14 MED FILL — FUROSEMIDE 20 MG TABLET: 20 | 7 days supply | Qty: 7 | Fill #0

## 2018-04-14 MED FILL — AMLODIPINE BESYLATE 10 MG T: 10 | 30 days supply | Qty: 30 | Fill #0

## 2018-04-14 MED FILL — IPRAT-ALBUT 0.5-3(2.5) MG/3: 0.5-2.5 (3) | 22 days supply | Qty: 90 | Fill #0

## 2018-04-14 MED FILL — !VENTOLIN HFA INHALER: 108 (90 BAS | 25 days supply | Qty: 18 | Fill #0

## 2018-04-14 NOTE — Progress Notes (Signed)
Per auto-sub policy. 

## 2018-04-14 NOTE — Progress Notes (Signed)
Patient ID: Marissa JacobsonJudith J Owens, female   DOB: 12-22-1960, 57 y.o.   MRN: [id number]       Marissa LefevreJudith Higinbotham, is a 57 y.o. female  [id number]  [id number]  DOB - 12-22-1960  Subjective:  Chief Complaint and HPI: Marissa LefevreJudith Soldo is a 57 y.o. female here today to establish care and for a follow up visit  [facility] with wheezing and cough and leg swelling. She was prescribed Lasix.   Still having cough that is productive of discolored phlegm.  No fever.  Leg swelling improved but did not resolve completely on Lasix.  Has been out of meds bc she can't afford them.  Needs RF.  Poor historian.  Denies CP/DOE/dizziness. Noted h/o elevated BNP during hospitalization 06/2017.    From A/P: Patient in no distress tonight. No tachypnea, tachycardia, hypoxia. Mild peripheral edema, improved since last visit, with mild pulmonary edema on cxray. No known CHF, but has had elevated BNP in the past with hospitalization. Lasix provided with strict return precautions. Encouraged close follow up for recheck. Patient verbalized understanding and agreeable to plan.  Ambulatory out of clinic without difficulty.    ED/Hospital notes reviewed.    ROS:   Constitutional:  No f/c, No night sweats, No unexplained weight loss. EENT:  No vision changes, No blurry vision, No hearing changes. No mouth, throat, or ear problems.  Respiratory: + cough, + wheezing Cardiac: No CP, no palpitations GI:  No abd pain, No N/V/D. GU: No Urinary s/sx Musculoskeletal: No joint pain Neuro: No headache, no dizziness, no motor weakness.  Skin: No rash Endocrine:  No polydipsia. No polyuria.  Psych: Denies SI/HI  No problems updated.  ALLERGIES: No Known Allergies  PAST MEDICAL HISTORY: Past Medical History:  Diagnosis Date  . COPD (chronic obstructive pulmonary disease) (HCC)   . Hypertension   . Renal disorder    kidney stone    MEDICATIONS AT HOME: Prior to Admission medications   Medication Sig Start Date  End Date Taking? Authorizing Provider  amLODipine (NORVASC) 10 MG tablet Take 1 tablet (10 mg total) by mouth daily. 04/14/18 07/13/18 Yes McClung, Marzella SchleinAngela M, PA-C  divalproex (DEPAKOTE) 500 MG DR tablet Take 500 mg by mouth 2 (two) times daily.    Yes [provider]  FLUoxetine (PROZAC) 20 MG tablet Take 3 tablets (60 mg total) by mouth daily. 04/14/18  Yes McClung, Angela M, PA-C  ipratropium-albuterol (DUONEB) 0.5-2.5 (3) MG/3ML SOLN Take 3 mLs by nebulization every 6 (six) hours as needed. 04/14/18  Yes Anders SimmondsMcClung, Angela M, PA-C  Melatonin 10 MG TABS Take 10 mg by mouth at bedtime.   Yes [provider]  metFORMIN (GLUCOPHAGE) 500 MG tablet Take 1 tablet (500 mg total) by mouth daily with breakfast. 04/14/18  Yes McClung, Angela M, PA-C  Nebulizers (COMPRESSOR/NEBULIZER) MISC 1 machine Dx : COPD 07/13/17  Yes Vann, Selinda OrionJessica U, DO  nicotine (NICODERM CQ - DOSED IN MG/24 HOURS) 14 mg/24hr patch Place 1 patch (14 mg total) onto the skin daily. 07/13/17 07/13/18 Yes Vann, Jessica U, DO  risperiDONE (RISPERDAL) 2 MG tablet Take 2 mg by mouth 2 (two) times daily.   Yes [provider]  Tiotropium Bromide Monohydrate (SPIRIVA RESPIMAT) 2.5 MCG/ACT AERS Inhale 2 puffs into the lungs daily. 04/14/18 05/14/18 Yes McClung, Marzella SchleinAngela M, PA-C  albuterol (PROVENTIL HFA;VENTOLIN HFA) 108 (90 Base) MCG/ACT inhaler Inhale 2 puffs into the lungs every 6 (six) hours as needed for wheezing or shortness of breath. 04/14/18  Georgian Co M, PA-C  azithromycin [facility]) 250 MG tablet Take 2 today then 1 daily 04/14/18   Anders Simmonds, PA-C  furosemide (LASIX) 20 MG tablet Take 1 tablet (20 mg total) by mouth daily for 7 days. 04/14/18 04/21/18  Anders Simmonds, PA-C     Objective:  EXAM:   Vitals:   04/14/18 1001  BP: (!) 136/97  Pulse: 86  Resp: 18  Temp: 98.2 F (36.8 C)  TempSrc: Oral  SpO2: 95%  Weight: 175 lb (79.4 kg)  Height: 5\' 2"  (1.575 m)    General appearance :  A&OX3. NAD. Non-toxic-appearing HEENT: Atraumatic and Normocephalic.  PERRLA. EOM intact.  TM clear B. Mouth-MMM, post pharynx WNL w/o erythema, No PND. Neck: supple, no JVD. No cervical lymphadenopathy. No thyromegaly Chest/Lungs:  Breathing-non-labored, Good air entry bilaterally, breath sounds normal without rales or rhonchi.  Mild wheezing throughout CVS: S1 S2 regular, no murmurs, gallops, rubs  Extremities: Bilateral Lower Ext shows <1+ edema, both legs are warm to touch with = pulse throughout Neurology:  CN II-XII grossly intact, Non focal.   Psych:  TP difficult to follow. J/I poor to fair. Normal speech. Appropriate eye contact and blunted affect.  Skin:  No Rash  Data Review Lab Results  Component Value Date   HGBA1C 5.7 04/14/2018     Assessment & Plan   1. COPD exacerbation (HCC) Pulse ox up to 97% after breathing treatment - ipratropium-albuterol (DUONEB) 0.5-2.5 (3) MG/3ML nebulizer solution 3 mL - ipratropium-albuterol (DUONEB) 0.5-2.5 (3) MG/3ML SOLN; Take 3 mLs by nebulization every 6 (six) hours as needed.  Dispense: 360 mL; Refill: 0 - Tiotropium Bromide Monohydrate (SPIRIVA RESPIMAT) 2.5 MCG/ACT AERS; Inhale 2 puffs into the lungs daily.  Dispense: 1 Inhaler; Refill: 1 - albuterol (PROVENTIL HFA;VENTOLIN HFA) 108 (90 Base) MCG/ACT inhaler; Inhale 2 puffs into the lungs every 6 (six) hours as needed for wheezing or shortness of breath.  Dispense: 1 Inhaler; Refill: 0 - Brain natriuretic peptide(reasses for HF-will refer if high)  2. Type 2 diabetes mellitus with hyperglycemia, without long-term current use of insulin (HCC) Controlled-continue current regimen - Glucose (CBG) - HgB A1c - metFORMIN (GLUCOPHAGE) 500 MG tablet; Take 1 tablet (500 mg total) by mouth daily with breakfast.  Dispense: 30 tablet; Refill: 0 - Comprehensive metabolic panel  3. Edema, unspecified type - furosemide (LASIX) 20 MG tablet; Take 1 tablet (20 mg total) by mouth daily for 7 days.   Dispense: 7 tablet; Refill: 0 - Brain natriuretic peptide  4. Hypertension, unspecified type Uncontrolled-resume medication - amLODipine (NORVASC) 10 MG tablet; Take 1 tablet (10 mg total) by mouth daily.  Dispense: 30 tablet; Refill: 2 - Comprehensive metabolic panel  5. Depression, unspecified depression type Stable.  PHQ-9 =14 #9=0, improved over previous score of 17- FLUoxetine (PROZAC) 20 MG tablet; Take 3 tablets (60 mg total) by mouth daily.  Dispense: 30 tablet; Refill: 3 -no acute safety issues.  Advised to f/up with [facility].     Patient have been counseled extensively about nutrition and exercise  Return in about 1 month (around 05/14/2018) for assign PCP;  recheck COPD.  The patient was given clear instructions to go to ER or return to medical center if symptoms don't improve, worsen or new problems develop. The patient verbalized understanding. The patient was told to call to get lab results if they haven't heard anything in the next week.     Georgian Co, PA-C  [facility] [place],  [place] [phone]   04/14/2018, 10:29 AM

## 2018-04-15 ENCOUNTER — Inpatient Hospital Stay: Payer: Self-pay

## 2018-04-15 LAB — BRAIN NATRIURETIC PEPTIDE: BNP: 13.6 pg/mL (ref 0.0–100.0)

## 2018-04-15 LAB — COMPREHENSIVE METABOLIC PANEL
ALK PHOS: 104 IU/L (ref 39–117)
ALT: 118 IU/L — AB (ref 0–32)
AST: 128 IU/L — AB (ref 0–40)
Albumin/Globulin Ratio: 1.1 — ABNORMAL LOW (ref 1.2–2.2)
Albumin: 4.4 g/dL (ref 3.5–5.5)
BILIRUBIN TOTAL: 0.6 mg/dL (ref 0.0–1.2)
BUN/Creatinine Ratio: 12 (ref 9–23)
BUN: 12 mg/dL (ref 6–24)
CHLORIDE: 94 mmol/L — AB (ref 96–106)
CO2: 28 mmol/L (ref 20–29)
Calcium: 10.7 mg/dL — ABNORMAL HIGH (ref 8.7–10.2)
Creatinine, Ser: 0.97 mg/dL (ref 0.57–1.00)
GFR calc non Af Amer: 65 mL/min/{1.73_m2} (ref >59)
GFR, EST AFRICAN AMERICAN: 74 mL/min/{1.73_m2} (ref >59)
GLUCOSE: 83 mg/dL (ref 65–99)
Globulin, Total: 4.1 g/dL (ref 1.5–4.5)
Potassium: 3.9 mmol/L (ref 3.5–5.2)
Sodium: 142 mmol/L (ref 134–144)
TOTAL PROTEIN: 8.5 g/dL (ref 6.0–8.5)

## 2018-04-18 ENCOUNTER — Telehealth (INDEPENDENT_AMBULATORY_CARE_PROVIDER_SITE_OTHER): Payer: Self-pay | Admitting: *Deleted

## 2018-04-18 NOTE — Telephone Encounter (Signed)
Patient verified DOB Patient is aware of kidney and blood sugar levels being normal. Patient is aware of heart concerning level being back to normal since the February result. Patient was advised to eliminate alcohol and tylenol due to liver function being elevated No further questions and patient will follow up as planned.

## 2018-04-18 NOTE — Telephone Encounter (Signed)
-----   Message from Anders SimmondsAngela M McClung, [place]PA-C sent at 04/15/2018  1:41 PM EST ----- Please call patient.  I rechecked a level regarding heart issues that had been elevated in February, but that is normal now.  Her liver function is elevated.  She should eliminate alcohol or products containing tylenol.  Her kidney function and blood sugar were good.  Follow up as planned.  Thanks, Georgian CoAngela McClung, PA-C

## 2018-06-06 ENCOUNTER — Ambulatory Visit: Payer: Self-pay | Admitting: Internal Medicine

## 2018-06-09 ENCOUNTER — Ambulatory Visit: Payer: Self-pay | Admitting: Internal Medicine

## 2018-07-12 ENCOUNTER — Ambulatory Visit: Payer: Self-pay | Admitting: Internal Medicine

## 2018-09-08 MED FILL — FLUoxetine HCL 20 MG TABS: 20 | 10 days supply | Qty: 30 | Fill #1

## 2018-09-08 MED FILL — AMLODIPINE BESYLATE 10 MG T: 10 | 30 days supply | Qty: 30 | Fill #1

## 2019-01-10 ENCOUNTER — Ambulatory Visit (HOSPITAL_COMMUNITY)
Admission: EM | Admit: 2019-01-10 | Discharge: 2019-01-10 | Disposition: A | Discharge status: Home / Self Care | Payer: Self-pay | Attending: Family Medicine | Admitting: Family Medicine

## 2019-01-10 ENCOUNTER — Other Ambulatory Visit: Payer: Self-pay

## 2019-01-10 ENCOUNTER — Encounter (HOSPITAL_COMMUNITY): Payer: Self-pay | Admitting: Emergency Medicine

## 2019-01-10 DIAGNOSIS — I10 Essential (primary) hypertension: Secondary | ICD-10-CM

## 2019-01-10 DIAGNOSIS — F32A Depression, unspecified: Secondary | ICD-10-CM

## 2019-01-10 DIAGNOSIS — M545 Low back pain, unspecified: Secondary | ICD-10-CM

## 2019-01-10 DIAGNOSIS — F329 Major depressive disorder, single episode, unspecified: Secondary | ICD-10-CM

## 2019-01-10 HISTORY — DX: Type 2 diabetes mellitus without complications: E11.9

## 2019-01-10 LAB — POCT URINALYSIS DIP (DEVICE)
Bilirubin Urine: NEGATIVE
Glucose, UA: NEGATIVE mg/dL
Hgb urine dipstick: NEGATIVE
Ketones, ur: NEGATIVE mg/dL
Leukocytes,Ua: NEGATIVE
Nitrite: NEGATIVE
Protein, ur: NEGATIVE mg/dL
Specific Gravity, Urine: >=1.03 (ref 1.005–1.030)
Urobilinogen, UA: 1 mg/dL (ref 0.0–1.0)
pH: 5 (ref 5.0–8.0)

## 2019-01-10 MED ORDER — IBUPROFEN 800 MG PO TABS: 800.0000 mg | ORAL_TABLET | Freq: Three times a day (TID) | ORAL | 0 refills | Status: DC

## 2019-01-10 MED ORDER — TIZANIDINE HCL 4 MG PO TABS: 4.0000 mg | ORAL_TABLET | Freq: Four times a day (QID) | ORAL | 0 refills | Status: DC | PRN

## 2019-01-10 MED ORDER — FLUOXETINE HCL 20 MG PO TABS: 60.0000 mg | ORAL_TABLET | Freq: Every day | ORAL | 2 refills | Status: DC

## 2019-01-10 MED ORDER — RISPERIDONE 2 MG PO TABS: 2.0000 mg | ORAL_TABLET | Freq: Two times a day (BID) | ORAL | 2 refills | Status: DC

## 2019-01-10 MED ORDER — NITROFURANTOIN MONOHYD MACRO 100 MG PO CAPS: 100.0000 mg | ORAL_CAPSULE | Freq: Two times a day (BID) | ORAL | 0 refills | Status: DC

## 2019-01-10 MED ORDER — AMLODIPINE BESYLATE 10 MG PO TABS: 10.0000 mg | ORAL_TABLET | Freq: Every day | ORAL | 2 refills | Status: DC

## 2019-01-10 NOTE — ED Triage Notes (Addendum)
Pt sts urinary frequency and sts upper back pain that feels like muscle tightness; pt sts out of meds

## 2019-01-10 NOTE — ED Provider Notes (Signed)
Marissa Owens    CSN: [id number] Arrival date & time: 01/10/19  1448      History   Chief Complaint Chief Complaint  Patient presents with  . Urinary Tract Infection  . Back Pain    HPI Marissa Owens is a 58 y.o. female.   HPI  Patient thinks she has a urinary tract infection.  Urinary frequency, some incontinence, occasional dysuria.  No vaginal discharge.  No vaginal rash.  No pelvic pain.  No fever chills.  She also has back pain, but it is in the lower part of her back.  This comes and goes.  She works as a [employer]arry manager.  She is very busy at her work and does a lot of lifting.  She also would like refills of her blood pressure medication.  These are provided for her.  Past Medical History:  Diagnosis Date  . COPD (chronic obstructive pulmonary disease) ([place])   . Diabetes mellitus without complication ([place])   . Hypertension   . Renal disorder    kidney stone    Patient Active Problem List   Diagnosis Date Noted  . Acute respiratory failure ([place]) 07/13/2017  . Tobacco abuse 07/13/2017  . COPD exacerbation ([place]) 07/11/2017    Past Surgical History:  Procedure Laterality Date  . TUBAL LIGATION      OB History   No obstetric history on file.      Home Medications    Prior to Admission medications   Medication Sig Start Date End Date Taking? Authorizing Provider  albuterol (PROVENTIL HFA;VENTOLIN HFA) 108 (90 Base) MCG/ACT inhaler Inhale 2 puffs into the lungs every 6 (six) hours as needed for wheezing or shortness of breath. 04/14/18   Argentina Donovan, PA-C  amLODipine (NORVASC) 10 MG tablet Take 1 tablet (10 mg total) by mouth daily. 01/10/19 04/10/19  Raylene Everts, MD  divalproex (DEPAKOTE) 500 MG DR tablet Take 500 mg by mouth 2 (two) times daily.     [provider]  FLUoxetine (PROZAC) 20 MG tablet Take 3 tablets (60 mg total) by mouth daily. 01/10/19   Raylene Everts, MD  ibuprofen (ADVIL) 800 MG tablet Take 1 tablet (800 mg total)  by mouth 3 (three) times daily. 01/10/19   Raylene Everts, MD  ipratropium-albuterol (DUONEB) 0.5-2.5 (3) MG/3ML SOLN Take 3 mLs by nebulization every 6 (six) hours as needed. 04/14/18   Argentina Donovan, PA-C  Melatonin 10 MG TABS Take 10 mg by mouth at bedtime.    [provider]  Nebulizers (COMPRESSOR/NEBULIZER) MISC 1 machine Dx : COPD 07/13/17   Geradine Girt, DO  nitrofurantoin, macrocrystal-monohydrate, (MACROBID) 100 MG capsule Take 1 capsule (100 mg total) by mouth 2 (two) times daily. 01/10/19   Raylene Everts, MD  risperiDONE (RISPERDAL) 2 MG tablet Take 1 tablet (2 mg total) by mouth 2 (two) times daily. 01/10/19   Raylene Everts, MD  Tiotropium Bromide Monohydrate (SPIRIVA RESPIMAT) 2.5 MCG/ACT AERS Inhale 2 puffs into the lungs daily. 04/14/18 05/14/18  Argentina Donovan, PA-C  tiZANidine (ZANAFLEX) 4 MG tablet Take 1-2 tablets (4-8 mg total) by mouth every 6 (six) hours as needed for muscle spasms. 01/10/19   Raylene Everts, MD  umeclidinium bromide (INCRUSE ELLIPTA) 62.5 MCG/INH AEPB Inhale 1 puff into the lungs daily. 04/14/18   Charlott Rakes, MD  furosemide (LASIX) 20 MG tablet Take 1 tablet (20 mg total) by mouth daily for 7 days. Patient not taking: Reported on  01/10/2019 04/14/18 01/10/19  Anders SimmondsMcClung, Angela M, PA-C  metFORMIN (GLUCOPHAGE) 500 MG tablet Take 1 tablet (500 mg total) by mouth daily with breakfast. Patient not taking: Reported on 01/10/2019 04/14/18 01/10/19  Anders SimmondsMcClung, Angela M, PA-C    Family History History reviewed. No pertinent family history.  Social History Social History   Tobacco Use  . Smoking status: Current Every Day Smoker    Packs/day: 1.00    Types: Cigarettes  . Smokeless tobacco: Never Used  Substance Use Topics  . Alcohol use: No    Frequency: Never  . Drug use: No     Allergies   Patient has no known allergies.   Review of Systems Review of Systems  Constitutional: Negative for chills and fever.  HENT:  Negative for ear pain and sore throat.   Eyes: Negative for pain and visual disturbance.  Respiratory: Negative for cough and shortness of breath.   Cardiovascular: Negative for chest pain and palpitations.  Gastrointestinal: Negative for abdominal pain and vomiting.  Genitourinary: Positive for dysuria and frequency. Negative for flank pain and hematuria.  Musculoskeletal: Positive for back pain. Negative for arthralgias.  Skin: Negative for color change and rash.  Neurological: Negative for seizures and syncope.  All other systems reviewed and are negative.    Physical Exam Triage Vital Signs ED Triage Vitals  Enc Vitals Group     BP 01/10/19 1515 (!) 140/95     Pulse Rate 01/10/19 1515 93     Resp 01/10/19 1515 18     Temp 01/10/19 1515 98.6 F (37 C)     Temp Source 01/10/19 1515 Oral     SpO2 01/10/19 1515 100 %     Weight --      Height --      Head Circumference --      Peak Flow --      Pain Score 01/10/19 1516 8     Pain Loc --      Pain Edu? --      Excl. in GC? --    No data found.  Updated Vital Signs BP (!) 140/95 (BP Location: Left Arm)   Pulse 93   Temp 98.6 F (37 C) (Oral)   Resp 18   SpO2 100%    Bilateral Near:     Physical Exam Constitutional:      General: She is not in acute distress.    Appearance: She is well-developed.  HENT:     Head: Normocephalic and atraumatic.  Eyes:     Conjunctiva/sclera: Conjunctivae normal.     Pupils: Pupils are equal, round, and reactive to light.  Neck:     Musculoskeletal: Normal range of motion.  Cardiovascular:     Rate and Rhythm: Normal rate.  Pulmonary:     Effort: Pulmonary effort is normal. No respiratory distress.  Abdominal:     General: There is no distension.     Palpations: Abdomen is soft.     Tenderness: There is no abdominal tenderness. There is no right CVA tenderness or left CVA tenderness.     Comments: No abdominal tenderness.  No CVA tenderness.  Musculoskeletal: Normal range of  motion.     Comments: Tenderness bilaterally in the lumbar muscles.  Mild tenderness of the SI joint.  No tenderness over the bony prominences.  Strength sensation range of motion reflexes are normal in both lower extremities.  Skin:    General: Skin is warm and dry.  Neurological:  General: No focal deficit present.     Mental Status: She is alert.  Psychiatric:        Mood and Affect: Mood normal.        Behavior: Behavior normal.      UC Treatments / Results  Labs (all labs ordered are listed, but only abnormal results are displayed) Labs Reviewed  POCT URINALYSIS DIP (DEVICE)    EKG   Radiology No results found.  Procedures Procedures (including critical care time)  Medications Ordered in UC Medications - No data to display  Initial Impression / Assessment and Plan / UC Course  I have reviewed the triage vital signs and the nursing notes.  Pertinent labs & imaging results that were available during my care of the patient were reviewed by me and considered in my medical decision making (see chart for details).  Clinical Course as of Jan 09 1954  Tue Jan 10, 2019  1547 POCT Urinalysis, Dipstick [YN]    Clinical Course User Index [YN] Eustace MooreNelson, Beauden Tremont Sue, MD    Urinalysis appears normal.  Patient does have UTI symptoms.  No vaginitis symptoms.  We will send a culture and give her a couple days of antibiotics.  We will treat her back pain with ibuprofen and a muscle relaxer.  Body mechanics briefly reviewed.  Ice and heat are recommended.  Follow-up with PCP Final Clinical Impressions(s) / UC Diagnoses   Final diagnoses:  Acute midline low back pain without sciatica     Discharge Instructions     Take the antibiotic 2 x a day Take  ibuprofen 3 x a day with food Take the muscle relaxer as needed    ED Prescriptions    Medication Sig Dispense Auth. Provider   nitrofurantoin, macrocrystal-monohydrate, (MACROBID) 100 MG capsule Take 1 capsule (100 mg  total) by mouth 2 (two) times daily. 10 capsule Eustace MooreNelson, Imri Lor Sue, MD   ibuprofen (ADVIL) 800 MG tablet Take 1 tablet (800 mg total) by mouth 3 (three) times daily. 21 tablet Eustace MooreNelson, Shawnte Winton Sue, MD   tiZANidine (ZANAFLEX) 4 MG tablet Take 1-2 tablets (4-8 mg total) by mouth every 6 (six) hours as needed for muscle spasms. 21 tablet Eustace MooreNelson, Suda Forbess Sue, MD   amLODipine (NORVASC) 10 MG tablet Take 1 tablet (10 mg total) by mouth daily. 30 tablet Eustace MooreNelson, Reyan Helle Sue, MD   risperiDONE (RISPERDAL) 2 MG tablet Take 1 tablet (2 mg total) by mouth 2 (two) times daily. 60 tablet Eustace MooreNelson, Mayanna Garlitz Sue, MD   FLUoxetine (PROZAC) 20 MG tablet Take 3 tablets (60 mg total) by mouth daily. 90 tablet Eustace MooreNelson, Verenis Nicosia Sue, MD     Controlled Substance Prescriptions [place] Controlled Substance Registry consulted? Not Applicable   Eustace MooreNelson, Gisele Pack Sue, MD 01/10/19 [place]1959

## 2019-01-10 NOTE — Discharge Instructions (Signed)
Take the antibiotic 2 x a day Take  ibuprofen 3 x a day with food Take the muscle relaxer as needed

## 2019-05-22 ENCOUNTER — Encounter (HOSPITAL_COMMUNITY): Payer: Self-pay

## 2019-05-22 ENCOUNTER — Ambulatory Visit (HOSPITAL_COMMUNITY)
Admission: EM | Admit: 2019-05-22 | Discharge: 2019-05-22 | Disposition: A | Discharge status: Home / Self Care | Payer: Self-pay | Attending: Physician Assistant | Admitting: Physician Assistant

## 2019-05-22 ENCOUNTER — Other Ambulatory Visit: Payer: Self-pay

## 2019-05-22 DIAGNOSIS — J441 Chronic obstructive pulmonary disease with (acute) exacerbation: Secondary | ICD-10-CM | POA: Insufficient documentation

## 2019-05-22 DIAGNOSIS — R350 Frequency of micturition: Secondary | ICD-10-CM

## 2019-05-22 DIAGNOSIS — K047 Periapical abscess without sinus: Secondary | ICD-10-CM | POA: Insufficient documentation

## 2019-05-22 DIAGNOSIS — I10 Essential (primary) hypertension: Secondary | ICD-10-CM | POA: Insufficient documentation

## 2019-05-22 DIAGNOSIS — R0789 Other chest pain: Secondary | ICD-10-CM | POA: Insufficient documentation

## 2019-05-22 LAB — POCT URINALYSIS DIP (DEVICE)
Bilirubin Urine: NEGATIVE
Glucose, UA: NEGATIVE mg/dL
Glucose, UA: NEGATIVE mg/dL
Ketones, ur: NEGATIVE mg/dL
Ketones, ur: NEGATIVE mg/dL
Leukocytes,Ua: NEGATIVE
Leukocytes,Ua: NEGATIVE
Nitrite: NEGATIVE
Nitrite: NEGATIVE
Protein, ur: NEGATIVE mg/dL
Protein, ur: NEGATIVE mg/dL
Specific Gravity, Urine: >=1.03 (ref 1.005–1.030)
Specific Gravity, Urine: >=1.03 (ref 1.005–1.030)
Urobilinogen, UA: 0.2 mg/dL (ref 0.0–1.0)
Urobilinogen, UA: 0.2 mg/dL (ref 0.0–1.0)
pH: 5 (ref 5.0–8.0)
pH: 5.5 (ref 5.0–8.0)

## 2019-05-22 LAB — GLUCOSE, CAPILLARY: Glucose-Capillary: 105 mg/dL — ABNORMAL HIGH (ref 70–99)

## 2019-05-22 LAB — CBG MONITORING, ED: Glucose-Capillary: 105 mg/dL — ABNORMAL HIGH (ref 70–99)

## 2019-05-22 MED ORDER — IPRATROPIUM-ALBUTEROL 0.5-2.5 (3) MG/3ML IN SOLN: 3.0000 mL | Freq: Four times a day (QID) | RESPIRATORY_TRACT | 1 refills | Status: DC | PRN

## 2019-05-22 MED ORDER — AMLODIPINE BESYLATE 10 MG PO TABS: 10.0000 mg | ORAL_TABLET | Freq: Every day | ORAL | 2 refills | Status: DC

## 2019-05-22 MED ORDER — HYDROCODONE-ACETAMINOPHEN 5-325 MG PO TABS: 1.0000 | ORAL_TABLET | Freq: Four times a day (QID) | ORAL | 0 refills | Status: AC | PRN

## 2019-05-22 MED ORDER — AMOXICILLIN-POT CLAVULANATE 875-125 MG PO TABS: 1.0000 | ORAL_TABLET | Freq: Two times a day (BID) | ORAL | 0 refills | Status: AC

## 2019-05-22 NOTE — ED Triage Notes (Signed)
Pt states she has a toothache and chest pains. X 3 days. Pt states she thinks she has a UTI x 2 weeks.

## 2019-05-22 NOTE — ED Notes (Signed)
Patient is unable to void at this time. Urinated prior to [facility] visit

## 2019-05-22 NOTE — ED Provider Notes (Signed)
[facility]    CSN: [id number] Arrival date & time: 05/22/19  0806      History   Chief Complaint Chief Complaint  Patient presents with  . Chest Pain  . Dental Pain    HPI Marissa Owens is a 58 y.o. female.   Patient reports to urgent care today for dental pain and chest pain and concerns for a UTI. She reports chest pain has been present for 2 week. Pain is made worse by deep breathing and standing up. She has not taking anything for the pain. She denies radiation of pain, nausea or vomiting with pain, shortness of breath with pain. She describes the pain as sharp at times but mainly aching. Rate this at 7/10.   She also reports 2-3 weeks of a broken tooth that is causing her a lot of pain. She denies discharge from the tooth but notes pain is getting worse. She is unable to chew on the right side of her mouth due to this. She has tried tylenol for this without help. She denies fever, chills, facial swelling.   She also reports utilizing her albuterol inhaler 3 times a day for some time due to shortness of breath. She has a known diagnosis of COPD and continues to smoke. Her cough has not increase, sputum has not increased. She denies decreased activity. She reports she has been unable to fill her prescriptions for ellipta or spiriva due to cost.   She endorses increase urinary frequency. Denies blood in her urine. Reports occasional painful urination.   She reports she has run out of her blood pressure medication. She states her blood pressure is normally well controlled with this. She denies headache or visual changes. She does endorse a history of blurry vision that she has used glasses for. She denies numbness, tingling, weakness.   She also reports history of depression and depressed mood lately. She reports a difficult year. She has not regularly use her Prozac as prescribed and has run out of this medication. She denies suicidal or homicidal ideation and has  previously been in therapy. She reports she is going to reach out to re-engage in therapy.   She notes her insurance restarts on Jan 1 and she plans to establish care with a Primary care and fill prescriptions as allowed.      Past Medical History:  Diagnosis Date  . COPD (chronic obstructive pulmonary disease) (HCC)   . Diabetes mellitus without complication (HCC)   . Hypertension   . Renal disorder    kidney stone    Patient Active Problem List   Diagnosis Date Noted  . Acute respiratory failure (HCC) 07/13/2017  . Tobacco abuse 07/13/2017  . COPD exacerbation (HCC) 07/11/2017    Past Surgical History:  Procedure Laterality Date  . TUBAL LIGATION      OB History   No obstetric history on file.      Home Medications    Prior to Admission medications   Medication Sig Start Date End Date Taking? Authorizing Provider  albuterol (PROVENTIL HFA;VENTOLIN HFA) 108 (90 Base) MCG/ACT inhaler Inhale 2 puffs into the lungs every 6 (six) hours as needed for wheezing or shortness of breath. 04/14/18   Anders Simmonds, PA-C  amLODipine (NORVASC) 10 MG tablet Take 1 tablet (10 mg total) by mouth daily. 05/22/19 08/20/19  Daruis Swaim, Veryl Speak, PA-C  amoxicillin-clavulanate (AUGMENTIN) 875-125 MG tablet Take 1 tablet by mouth every 12 (twelve) hours for 10 days. 05/22/19 06/01/19  Raveen Wieseler, Veryl Speak, PA-C  divalproex (DEPAKOTE) 500 MG DR tablet Take 500 mg by mouth 2 (two) times daily.     [provider]  FLUoxetine (PROZAC) 20 MG tablet Take 3 tablets (60 mg total) by mouth daily. 01/10/19   Eustace Moore, MD  HYDROcodone-acetaminophen (NORCO/VICODIN) 5-325 MG tablet Take 1-2 tablets by mouth every 6 (six) hours as needed for up to 5 days for moderate pain or severe pain. 05/22/19 05/27/19  Isai Gottlieb, Veryl Speak, PA-C  ibuprofen (ADVIL) 800 MG tablet Take 1 tablet (800 mg total) by mouth 3 (three) times daily. 01/10/19   Eustace Moore, MD  ipratropium-albuterol (DUONEB) 0.5-2.5 (3) MG/3ML  SOLN Take 3 mLs by nebulization every 6 (six) hours as needed for up to 7 days. 05/22/19 05/29/19  Gwynneth Fabio, Veryl Speak, PA-C  Melatonin 10 MG TABS Take 10 mg by mouth at bedtime.    [provider]  Nebulizers (COMPRESSOR/NEBULIZER) MISC 1 machine Dx : COPD 07/13/17   Joseph Art, DO  nitrofurantoin, macrocrystal-monohydrate, (MACROBID) 100 MG capsule Take 1 capsule (100 mg total) by mouth 2 (two) times daily. 01/10/19   Eustace Moore, MD  risperiDONE (RISPERDAL) 2 MG tablet Take 1 tablet (2 mg total) by mouth 2 (two) times daily. 01/10/19   Eustace Moore, MD  Tiotropium Bromide Monohydrate (SPIRIVA RESPIMAT) 2.5 MCG/ACT AERS Inhale 2 puffs into the lungs daily. 04/14/18 05/14/18  Anders Simmonds, PA-C  tiZANidine (ZANAFLEX) 4 MG tablet Take 1-2 tablets (4-8 mg total) by mouth every 6 (six) hours as needed for muscle spasms. 01/10/19   Eustace Moore, MD  umeclidinium bromide (INCRUSE ELLIPTA) 62.5 MCG/INH AEPB Inhale 1 puff into the lungs daily. 04/14/18   Hoy Register, MD  furosemide (LASIX) 20 MG tablet Take 1 tablet (20 mg total) by mouth daily for 7 days. Patient not taking: Reported on 01/10/2019 04/14/18 01/10/19  Anders Simmonds, PA-C  metFORMIN (GLUCOPHAGE) 500 MG tablet Take 1 tablet (500 mg total) by mouth daily with breakfast. Patient not taking: Reported on 01/10/2019 04/14/18 01/10/19  Anders Simmonds, PA-C    Family History No family history on file.  Social History Social History   Tobacco Use  . Smoking status: Current Every Day Smoker    Packs/day: 1.00    Types: Cigarettes  . Smokeless tobacco: Never Used  Substance Use Topics  . Alcohol use: No  . Drug use: No     Allergies   Patient has no known allergies.   Review of Systems Review of Systems  Constitutional: Positive for appetite change. Negative for chills and fever.  HENT: Positive for dental problem (broken painful tooth). Negative for ear pain and sore throat.   Eyes: Negative for  pain and visual disturbance.  Respiratory: Positive for cough (chronic) and shortness of breath. Negative for chest tightness.   Cardiovascular: Positive for chest pain. Negative for palpitations and leg swelling.  Gastrointestinal: Negative for abdominal pain and vomiting.  Endocrine: Positive for polyuria.  Genitourinary: Positive for dysuria and frequency. Negative for difficulty urinating, hematuria, vaginal bleeding, vaginal discharge and vaginal pain.  Musculoskeletal: Positive for myalgias. Negative for arthralgias and back pain.  Skin: Negative for color change and rash.  Neurological: Negative for seizures, syncope and headaches.  All other systems reviewed and are negative.    Physical Exam Triage Vital Signs ED Triage Vitals  Enc Vitals Group     BP 05/22/19 0818 (!) 166/111     Pulse Rate 05/22/19 0818  77     Resp 05/22/19 0818 16     Temp 05/22/19 0818 98 F (36.7 C)     Temp Source 05/22/19 0818 Oral     SpO2 05/22/19 0818 100 %     Weight 05/22/19 0816 140 lb (63.5 kg)     Height --      Head Circumference --      Peak Flow --      Pain Score 05/22/19 0816 8     Pain Loc --      Pain Edu? --      Excl. in GC? --    No data found.  Updated Vital Signs BP (!) 166/111 (BP Location: Right Arm)   Pulse 77   Temp 98 F (36.7 C) (Oral)   Resp 16   Wt 140 lb (63.5 kg)   SpO2 100%   BMI 25.61 kg/m   Visual Acuity Right Eye Distance:   Left Eye Distance:   Bilateral Distance:    Right Eye Near:   Left Eye Near:    Bilateral Near:     Physical Exam Vitals and nursing note reviewed.  Constitutional:      General: She is not in acute distress.    Appearance: She is well-developed and normal weight.  HENT:     Head: Normocephalic and atraumatic.     Nose: Nose normal. No congestion.     Mouth/Throat:     Mouth: Mucous membranes are moist.     Dentition: Dental tenderness and gingival swelling present.     Pharynx: Oropharynx is clear. No posterior  oropharyngeal erythema.   Eyes:     General: No scleral icterus.    Conjunctiva/sclera: Conjunctivae normal.     Pupils: Pupils are equal, round, and reactive to light.  Cardiovascular:     Rate and Rhythm: Normal rate and regular rhythm.     Heart sounds: No murmur.  Pulmonary:     Effort: Pulmonary effort is normal. No respiratory distress.     Breath sounds: Normal breath sounds. No wheezing, rhonchi or rales.  Abdominal:     General: Abdomen is flat. Bowel sounds are normal.     Palpations: Abdomen is soft.     Tenderness: There is no abdominal tenderness. There is no right CVA tenderness or left CVA tenderness.  Musculoskeletal:        General: Normal range of motion.     Cervical back: Normal range of motion and neck supple.     Right lower leg: No edema.     Left lower leg: No edema.     Comments: TTP on left chest at sternum   Lymphadenopathy:     Cervical: No cervical adenopathy.  Skin:    General: Skin is warm and dry.     Coloration: Skin is not jaundiced.  Neurological:     General: No focal deficit present.     Mental Status: She is alert and oriented to person, place, and time.  Psychiatric:        Mood and Affect: Mood normal.        Behavior: Behavior normal.        Thought Content: Thought content normal.        Judgment: Judgment normal.      UC Treatments / Results  Labs (all labs ordered are listed, but only abnormal results are displayed) Labs Reviewed  GLUCOSE, CAPILLARY - Abnormal; Notable for the following components:      Result  Value   Glucose-Capillary 105 (*)    All other components within normal limits  CBG MONITORING, ED - Abnormal; Notable for the following components:   Glucose-Capillary 105 (*)    All other components within normal limits  POCT URINALYSIS DIP (DEVICE) - Abnormal; Notable for the following components:   Bilirubin Urine SMALL (*)    Hgb urine dipstick TRACE (*)    All other components within normal limits  POCT  URINALYSIS DIP (DEVICE) - Abnormal; Notable for the following components:   Hgb urine dipstick TRACE (*)    All other components within normal limits  URINE CULTURE    EKG Sinus rhythm with 1st degree AV nodal block. No ST elevation.   Radiology No results found.  Procedures Procedures (including critical care time)  Medications Ordered in UC Medications - No data to display  Initial Impression / Assessment and Plan / UC Course  I have reviewed the triage vital signs and the nursing notes.  Pertinent labs & imaging results that were available during my care of the patient were reviewed by me and considered in my medical decision making (see chart for details).     #Mild COPD exacerbation #elevated blood pressure #chest pain #Dental infection - Chest pain non-cardiac, EKG reassuring. Shortness of breath consistent with mild exacerbation of COPD or non-compliance on medications.  - has not had blood pressure medication for a few days, restarting home medications with follow up and return precautions discussed - Broken tooth with evidence of infection. Treating with augmentin and dentist information given with instructions to be seen as soon as possible. Gave Short term norco for pain management until definitive care with dentist can be achieved   - Discussed need to fill the prescriptions she has on file for her mental health history. Safety plan discussed.  Final Clinical Impressions(s) / UC Diagnoses   Final diagnoses:  COPD exacerbation (HCC)  Elevated blood pressure reading with diagnosis of hypertension  Other chest pain  Dental infection     Discharge Instructions     I have sent a refill for your blood pressure medication , please pick this up and begin taking this medication today.  Begin taking the Augmentin 2 times a day for 10 days. Please utilize the dental resources attached to be seen in the next 1 week.  I have sent pain medication in, use 1-2 tablets  every 6 hours as needed for pain. Utilize ibuprofen 2 tablets of 200mg  if your pain is not severe.   I have sent a prescription for a DuoNeb , use this 4 times a day for 7 days.   If you are able to afford to fill the Incruse Ellipta, please do so and begin using this daily.   There are refills on file for your prozac and risperdal, please refill these and begin as directed. Prozac- begin taking 1 tablet daily for 1 week, then 2 tablets daily for 1 week and after 1 additional week , begin taking 3 tablets daily.   Please establish care with a Primary Care provider to follow up on your high blood pressure and COPD.  Please re-establish with your mental health provider. If you feel as though you may harm yourself, please call 911, report to the Emergency department or call the national suicide prevention hotline: [phone]  Go directly to the Emergency Department or call 911 if you have severe chest pain, shortness of breath, severe diarrhea or feel as though you might pass out.  ED Prescriptions    Medication Sig Dispense Auth. Provider   amLODipine (NORVASC) 10 MG tablet Take 1 tablet (10 mg total) by mouth daily. 30 tablet Emmette Katt, Veryl SpeakJacob E, PA-C   ipratropium-albuterol (DUONEB) 0.5-2.5 (3) MG/3ML SOLN Take 3 mLs by nebulization every 6 (six) hours as needed for up to 7 days. 360 mL Ressie Slevin, Veryl SpeakJacob E, PA-C   amoxicillin-clavulanate (AUGMENTIN) 875-125 MG tablet Take 1 tablet by mouth every 12 (twelve) hours for 10 days. 14 tablet Orpheus Hayhurst, Veryl SpeakJacob E, PA-C   HYDROcodone-acetaminophen (NORCO/VICODIN) 5-325 MG tablet Take 1-2 tablets by mouth every 6 (six) hours as needed for up to 5 days for moderate pain or severe pain. 10 tablet Bethlehem Langstaff, Veryl SpeakJacob E, PA-C     I have reviewed the PDMP during this encounter.   Hermelinda Medicusarr, Cyerra Yim E, PA-C 05/22/19 1705

## 2019-05-22 NOTE — Discharge Instructions (Addendum)
I have sent a refill for your blood pressure medication , please pick this up and begin taking this medication today.  Begin taking the Augmentin 2 times a day for 10 days. Please utilize the dental resources attached to be seen in the next 1 week.  I have sent pain medication in, use 1-2 tablets every 6 hours as needed for pain. Utilize ibuprofen 2 tablets of 200mg  if your pain is not severe.   I have sent a prescription for a DuoNeb , use this 4 times a day for 7 days.   If you are able to afford to fill the Incruse Ellipta, please do so and begin using this daily.   There are refills on file for your prozac and risperdal, please refill these and begin as directed. Prozac- begin taking 1 tablet daily for 1 week, then 2 tablets daily for 1 week and after 1 additional week , begin taking 3 tablets daily.   Please establish care with a Primary Care provider to follow up on your high blood pressure and COPD.  Please re-establish with your mental health provider. If you feel as though you may harm yourself, please call 911, report to the Emergency department or call the national suicide prevention hotline: [phone]  Go directly to the Emergency Department or call 911 if you have severe chest pain, shortness of breath, severe diarrhea or feel as though you might pass out.

## 2019-05-23 LAB — URINE CULTURE: Culture: NO GROWTH

## 2019-06-28 ENCOUNTER — Other Ambulatory Visit: Payer: Self-pay | Admitting: Physician Assistant

## 2019-06-28 DIAGNOSIS — J441 Chronic obstructive pulmonary disease with (acute) exacerbation: Secondary | ICD-10-CM

## 2019-07-03 ENCOUNTER — Encounter (HOSPITAL_COMMUNITY): Payer: Self-pay

## 2019-07-03 ENCOUNTER — Other Ambulatory Visit: Payer: Self-pay

## 2019-07-03 ENCOUNTER — Emergency Department (HOSPITAL_COMMUNITY): Payer: Managed Care, Other (non HMO)

## 2019-07-03 ENCOUNTER — Encounter: Payer: Self-pay | Admitting: Family Medicine

## 2019-07-03 ENCOUNTER — Emergency Department (HOSPITAL_COMMUNITY)
Admission: EM | Admit: 2019-07-03 | Discharge: 2019-07-03 | Disposition: A | Discharge status: Home / Self Care | Payer: Managed Care, Other (non HMO) | Attending: Emergency Medicine | Admitting: Emergency Medicine

## 2019-07-03 ENCOUNTER — Ambulatory Visit (HOSPITAL_BASED_OUTPATIENT_CLINIC_OR_DEPARTMENT_OTHER): Payer: Managed Care, Other (non HMO) | Admitting: Family Medicine

## 2019-07-03 DIAGNOSIS — R059 Cough, unspecified: Secondary | ICD-10-CM

## 2019-07-03 DIAGNOSIS — Z7984 Long term (current) use of oral hypoglycemic drugs: Secondary | ICD-10-CM | POA: Insufficient documentation

## 2019-07-03 DIAGNOSIS — R05 Cough: Secondary | ICD-10-CM

## 2019-07-03 DIAGNOSIS — I10 Essential (primary) hypertension: Secondary | ICD-10-CM | POA: Insufficient documentation

## 2019-07-03 DIAGNOSIS — J441 Chronic obstructive pulmonary disease with (acute) exacerbation: Secondary | ICD-10-CM

## 2019-07-03 DIAGNOSIS — E119 Type 2 diabetes mellitus without complications: Secondary | ICD-10-CM | POA: Diagnosis not present

## 2019-07-03 DIAGNOSIS — Z79899 Other long term (current) drug therapy: Secondary | ICD-10-CM | POA: Diagnosis not present

## 2019-07-03 DIAGNOSIS — Z87891 Personal history of nicotine dependence: Secondary | ICD-10-CM | POA: Diagnosis not present

## 2019-07-03 DIAGNOSIS — R1031 Right lower quadrant pain: Secondary | ICD-10-CM

## 2019-07-03 DIAGNOSIS — R1030 Lower abdominal pain, unspecified: Secondary | ICD-10-CM | POA: Insufficient documentation

## 2019-07-03 DIAGNOSIS — R053 Chronic cough: Secondary | ICD-10-CM

## 2019-07-03 DIAGNOSIS — D72829 Elevated white blood cell count, unspecified: Secondary | ICD-10-CM | POA: Insufficient documentation

## 2019-07-03 LAB — CBC
HCT: 45.6 % (ref 36.0–46.0)
Hemoglobin: 15.3 g/dL — ABNORMAL HIGH (ref 12.0–15.0)
MCH: 31.7 pg (ref 26.0–34.0)
MCHC: 33.6 g/dL (ref 30.0–36.0)
MCV: 94.4 fL (ref 80.0–100.0)
Platelets: 230 10*3/uL (ref 150–400)
RBC: 4.83 MIL/uL (ref 3.87–5.11)
RDW: 12.5 % (ref 11.5–15.5)
WBC: 15.6 10*3/uL — ABNORMAL HIGH (ref 4.0–10.5)
nRBC: 0 % (ref 0.0–0.2)

## 2019-07-03 LAB — COMPREHENSIVE METABOLIC PANEL
ALT: 41 U/L (ref 0–44)
AST: 33 U/L (ref 15–41)
Albumin: 4.1 g/dL (ref 3.5–5.0)
Alkaline Phosphatase: 102 U/L (ref 38–126)
Anion gap: 9 (ref 5–15)
BUN: 13 mg/dL (ref 6–20)
CO2: 27 mmol/L (ref 22–32)
Calcium: 9.4 mg/dL (ref 8.9–10.3)
Chloride: 102 mmol/L (ref 98–111)
Creatinine, Ser: 0.76 mg/dL (ref 0.44–1.00)
GFR calc Af Amer: >60 mL/min (ref >60)
GFR calc non Af Amer: >60 mL/min (ref >60)
Glucose, Bld: 105 mg/dL — ABNORMAL HIGH (ref 70–99)
Potassium: 3.7 mmol/L (ref 3.5–5.1)
Sodium: 138 mmol/L (ref 135–145)
Total Bilirubin: 1.1 mg/dL (ref 0.3–1.2)
Total Protein: 8.4 g/dL — ABNORMAL HIGH (ref 6.5–8.1)

## 2019-07-03 LAB — URINALYSIS, ROUTINE W REFLEX MICROSCOPIC
Bilirubin Urine: NEGATIVE
Glucose, UA: NEGATIVE mg/dL
Hgb urine dipstick: NEGATIVE
Ketones, ur: NEGATIVE mg/dL
Leukocytes,Ua: NEGATIVE
Nitrite: NEGATIVE
Protein, ur: NEGATIVE mg/dL
Specific Gravity, Urine: 1.026 (ref 1.005–1.030)
pH: 5 (ref 5.0–8.0)

## 2019-07-03 LAB — LIPASE, BLOOD: Lipase: 22 U/L (ref 11–51)

## 2019-07-03 LAB — I-STAT BETA HCG BLOOD, ED (MC, WL, AP ONLY): I-stat hCG, quantitative: <5 m[IU]/mL (ref <5)

## 2019-07-03 MED ORDER — ALBUTEROL SULFATE HFA 108 (90 BASE) MCG/ACT IN AERS
4.0000 | INHALATION_SPRAY | Freq: Once | RESPIRATORY_TRACT | Status: AC
  Administered 2019-07-03: 4 via RESPIRATORY_TRACT
  Filled 2019-07-03: qty 6.7

## 2019-07-03 MED ORDER — ADULT MASK MISC: 0 refills | Status: AC

## 2019-07-03 MED ORDER — PREDNISONE 20 MG PO TABS: 40.0000 mg | ORAL_TABLET | Freq: Every day | ORAL | 0 refills | Status: AC

## 2019-07-03 MED ORDER — SODIUM CHLORIDE 0.9% FLUSH
3.0000 mL | Freq: Once | INTRAVENOUS | Status: AC
  Administered 2019-07-03: 19:00:00 3 mL via INTRAVENOUS

## 2019-07-03 MED ORDER — METHYLPREDNISOLONE SODIUM SUCC 125 MG IJ SOLR
125.0000 mg | Freq: Once | INTRAMUSCULAR | Status: AC
  Administered 2019-07-03: 19:00:00 125 mg via INTRAVENOUS
  Filled 2019-07-03: qty 2

## 2019-07-03 MED ORDER — ALBUTEROL SULFATE HFA 108 (90 BASE) MCG/ACT IN AERS: 2.0000 | INHALATION_SPRAY | Freq: Four times a day (QID) | RESPIRATORY_TRACT | 3 refills | Status: DC | PRN

## 2019-07-03 MED ORDER — ADULT MASK MISC: 0 refills | Status: DC

## 2019-07-03 MED ORDER — PREDNISONE 20 MG PO TABS: ORAL_TABLET | ORAL | 0 refills | Status: DC

## 2019-07-03 MED ORDER — IPRATROPIUM-ALBUTEROL 0.5-2.5 (3) MG/3ML IN SOLN: 3.0000 mL | Freq: Four times a day (QID) | RESPIRATORY_TRACT | 1 refills | Status: DC | PRN

## 2019-07-03 MED ORDER — ALBUTEROL SULFATE (2.5 MG/3ML) 0.083% IN NEBU: 2.5000 mg | INHALATION_SOLUTION | Freq: Four times a day (QID) | RESPIRATORY_TRACT | 12 refills | Status: DC | PRN

## 2019-07-03 MED ORDER — DOXYCYCLINE HYCLATE 100 MG PO TABS: 100.0000 mg | ORAL_TABLET | Freq: Two times a day (BID) | ORAL | 0 refills | Status: DC

## 2019-07-03 NOTE — Progress Notes (Signed)
Virtual Visit via Telephone Note  I connected with Marissa Owens  on 07/03/19 at  9:30 AM EST by telephone and verified that I am speaking with the correct person using two identifiers.   I discussed the limitations, risks, security and privacy concerns of performing an evaluation and management service by telephone and the availability of in person appointments. I also discussed with the patient that there may be a patient responsible charge related to this service. The patient expressed understanding and agreed to proceed.  Patient Location: Home Provider Location: [facility] Others participating in call: none   History of Present Illness:        59 year old diabetic female with history of COPD, who is new to me as the patient but was seen by another provider here at the office on 04/14/2018, who reports that she " believes my lungs are damaged because I do not have any meds to clear them up".  She reports a nonproductive cough for greater than a month.  She has also had recent increase in shortness of breath and wheezing.  She would like to have medication that she can use through a nebulizer sent to her pharmacy, [facility], but reports that she does not have a mask for her nebulizer so she would also need a prescription sent and to obtain a mask.  She cannot recall where she initially obtained her nebulizer supplies.          She also reports that she believes that she is having problems with her appendix.  She reports that she has had right lower abdominal pain for the past 3 days that is getting worse.  She states that she needs a work note to have a few days off to get herself together and figure out what is going on with her health.  She reports that she is also having severe back pain from a fall on the ice about 3 weeks ago after slipping and falling.  Past Medical History:  Diagnosis Date  . COPD (chronic obstructive pulmonary disease) (HCC)   . Diabetes mellitus without  complication (HCC)   . Hypertension   . Renal disorder    kidney stone    Past Surgical History:  Procedure Laterality Date  . TUBAL LIGATION      History reviewed. No pertinent family history.  Social History   Tobacco Use  . Smoking status: Current Every Day Smoker    Packs/day: 1.00    Types: Cigarettes  . Smokeless tobacco: Never Used  Substance Use Topics  . Alcohol use: No  . Drug use: No     No Known Allergies     Observations/Objective: No vital signs or physical exam conducted as visit was done via telephone  Assessment and Plan: 1. COPD exacerbation (HCC) Patient with initial complaint of cough that is mostly nonproductive but that she is also having wheezing and chest tightness along with shortness of breath similar to prior COPD exacerbations.  Patient requested medication that she can use per nebulizer and prescription was sent to her pharmacy for duo nebs which were listed on her medication list.  She also requested a mask for her nebulizer but cannot recall where she previously received her respiratory supplies.  Prescription for a mask for her inhaler was sent to her pharmacy.  Prescriptions also sent for doxycycline and prednisone due to suspected COPD exacerbation.  Prescription also sent for albuterol inhaler to use as needed in case she cannot get the mask for  her nebulizer through her pharmacy. - albuterol (VENTOLIN HFA) 108 (90 Base) MCG/ACT inhaler; Inhale 2 puffs into the lungs every 6 (six) hours as needed for wheezing or shortness of breath.  Dispense: 18 g; Refill: 3 - doxycycline (VIBRA-TABS) 100 MG tablet; Take 1 tablet (100 mg total) by mouth 2 (two) times daily.  Dispense: 20 tablet; Refill: 0 - predniSONE (DELTASONE) 20 MG tablet; 2 pills once daily x 5 days, take after eating  Dispense: 10 tablet; Refill: 0 - Respiratory Therapy Supplies (ADULT MASK) MISC; Needs an mask for her nebulizer machine  Dispense: 1 each; Refill: 0 - ipratropium-albuterol  (DUONEB) 0.5-2.5 (3) MG/3ML SOLN; Take 3 mLs by nebulization every 6 (six) hours as needed for up to 7 days.  Dispense: 360 mL; Refill: 1  2. Acute right lower quadrant pain Patient initially stated that today's visit was about a recurrent cough and that she had a history of COPD.  I agreed to send in an antibiotic and prednisone to her pharmacy as well as refill of DuoNeb which is currently on her medication list as she requested a medication that she could take by nebulizer.  Prescription was also sent for nebulizer mask as she stated that she did not have one.  She then stated that she had been having right lower abdominal pain for 3 days and that she thought that she was having an issue with her appendix.  She was instructed that she would need to go to the emergency department for further follow-up of her right lower quadrant pain to make sure that she was not having issues with appendicitis.  She again requested work note for a few days and she was told that this would be addressed with her once she was evaluated at the emergency department.  Patient then asked to talk to another nurse or someone who could help her find another primary care provider at this office as she states that she did not feel that I was helping her.  I told patient that I would relay her concerns to the office practice manager and that the practice manager would attempt to contact her but that it was very important that the patient go to the emergency department in follow-up of her right lower abdominal pain.  3. Persistent cough for 3 weeks or longer Patient with complaint of a cough for over 4 weeks.  She will be treated for current COPD exacerbation but may need follow-up with pulmonology and/or further imaging of her coughing does not improve.  Follow Up Instructions:Return for Chronic issues ;schedule follow-up after evaluation for right lower quadrant pain at the ED.    I discussed the assessment and treatment plan with  the patient. The patient was provided an opportunity to ask questions and all were answered. The patient agreed with the plan and demonstrated an understanding of the instructions.   The patient was advised to call back or seek an in-person evaluation if the symptoms worsen or if the condition fails to improve as anticipated.  I provided 16  minutes of non-face-to-face time during this encounter.   Antony Blackbird, MD

## 2019-07-03 NOTE — Discharge Instructions (Signed)
As we discussed, your chest x-ray is reassuring.  I feel that this is most likely from your COPD.  As we discussed, your lab work was reassuring but it did show that you have a slightly elevated white blood cell count which can sometimes indicate infection.  We discussed about obtaining a CT on pelvis but you declined that today.  Please closely monitor your symptoms and return the emergency department for any worsening abdominal pain, vomiting, fever or any other worsening or concerning symptoms.

## 2019-07-03 NOTE — ED Provider Notes (Signed)
[facility]-EMERGENCY DEPT Provider Note   CSN: [id number] Arrival date & time: 07/03/19  1210     History Chief Complaint  Patient presents with  . Cough  . Abdominal Pain    Marissa Owens is a 59 y.o. female with past medical history of COPD, diabetes, hypertension, kidney stone who presents for evaluation of cough that has been ongoing for 1 month and bilateral lower abdominal pain x3 days.  She states cough has been nonproductive for about a month.  She states that she has not seen anybody.  She has a history of COPD and feels like it is related to that.  She she has been wheezing.  She has been using her inhalers but states she ran out of her nebulizer solution.  She has no shortness of breath, chest pain associated with it.  No fevers.  Additionally, she reports that about 2 or 3 days, she has had some lower abdominal pain.  She states that it has been intermittently dull and sharp.  She states actually today, it started getting better.  She states no alleviating or averaging factors.  She does not take any medication.  She denies any fevers, nausea/vomiting, diarrhea, chest pain, difficulty breathing, dysuria.  The history is provided by the patient.       Past Medical History:  Diagnosis Date  . COPD (chronic obstructive pulmonary disease) (HCC)   . Diabetes mellitus without complication (HCC)   . Hypertension   . Renal disorder    kidney stone    Patient Active Problem List   Diagnosis Date Noted  . Acute respiratory failure (HCC) 07/13/2017  . Tobacco abuse 07/13/2017  . COPD exacerbation (HCC) 07/11/2017    Past Surgical History:  Procedure Laterality Date  . TUBAL LIGATION       OB History   No obstetric history on file.     Family History  Problem Relation Age of Onset  . Cancer Mother   . Hypertension Mother     Social History   Tobacco Use  . Smoking status: Former Smoker    Packs/day: 1.00    Types: Cigarettes  .  Smokeless tobacco: Never Used  Substance Use Topics  . Alcohol use: No  . Drug use: No    Home Medications Prior to Admission medications   Medication Sig Start Date End Date Taking? Authorizing Provider  amLODipine (NORVASC) 10 MG tablet Take 1 tablet (10 mg total) by mouth daily. 05/22/19 08/20/19 Yes Darr, Veryl Speak, PA-C  FLUoxetine (PROZAC) 20 MG tablet Take 3 tablets (60 mg total) by mouth daily. 01/10/19  Yes Eustace Moore, MD  Melatonin 10 MG TABS Take 10 mg by mouth at bedtime.   Yes [provider]  albuterol (PROVENTIL) (2.5 MG/3ML) 0.083% nebulizer solution Take 3 mLs (2.5 mg total) by nebulization every 6 (six) hours as needed for wheezing or shortness of breath. 07/03/19   Maxwell Caul, PA-C  doxycycline (VIBRA-TABS) 100 MG tablet Take 1 tablet (100 mg total) by mouth 2 (two) times daily. 07/03/19   Fulp, Cammie, MD  ibuprofen (ADVIL) 800 MG tablet Take 1 tablet (800 mg total) by mouth 3 (three) times daily. Patient not taking: Reported on 07/03/2019 01/10/19   Eustace Moore, MD  ipratropium-albuterol (DUONEB) 0.5-2.5 (3) MG/3ML SOLN Take 3 mLs by nebulization every 6 (six) hours as needed for up to 7 days. 07/03/19 07/10/19  Cain Saupe, MD  Nebulizers (COMPRESSOR/NEBULIZER) MISC 1 machine Dx : COPD  07/13/17   Joseph Art, DO  predniSONE (DELTASONE) 20 MG tablet Take 2 tablets (40 mg total) by mouth daily for 4 days. 07/03/19 07/07/19  Maxwell Caul, PA-C  Respiratory Therapy Supplies (ADULT MASK) MISC Needs an mask for her nebulizer machine 07/03/19   Fulp, Cammie, MD  risperiDONE (RISPERDAL) 2 MG tablet Take 1 tablet (2 mg total) by mouth 2 (two) times daily. Patient not taking: Reported on 07/03/2019 01/10/19   Eustace Moore, MD  Tiotropium Bromide Monohydrate (SPIRIVA RESPIMAT) 2.5 MCG/ACT AERS Inhale 2 puffs into the lungs daily. Patient not taking: Reported on 07/03/2019 04/14/18 07/03/19  Anders Simmonds, PA-C  tiZANidine (ZANAFLEX) 4 MG tablet Take 1-2  tablets (4-8 mg total) by mouth every 6 (six) hours as needed for muscle spasms. Patient not taking: Reported on 07/03/2019 01/10/19   Eustace Moore, MD  umeclidinium bromide (INCRUSE ELLIPTA) 62.5 MCG/INH AEPB Inhale 1 puff into the lungs daily. Patient not taking: Reported on 07/03/2019 04/14/18   Hoy Register, MD  furosemide (LASIX) 20 MG tablet Take 1 tablet (20 mg total) by mouth daily for 7 days. Patient not taking: Reported on 01/10/2019 04/14/18 01/10/19  Anders Simmonds, PA-C  metFORMIN (GLUCOPHAGE) 500 MG tablet Take 1 tablet (500 mg total) by mouth daily with breakfast. Patient not taking: Reported on 01/10/2019 04/14/18 01/10/19  Anders Simmonds, PA-C    Allergies    Patient has no known allergies.  Review of Systems   Review of Systems  Constitutional: Negative for fever.  Respiratory: Positive for cough. Negative for shortness of breath.   Cardiovascular: Negative for chest pain.  Gastrointestinal: Positive for abdominal pain. Negative for diarrhea, nausea and vomiting.  Genitourinary: Negative for dysuria and hematuria.  Neurological: Negative for headaches.  All other systems reviewed and are negative.   Physical Exam Updated Vital Signs BP 124/87 (BP Location: Right Arm)   Pulse 77   Temp 98.1 F (36.7 C) (Oral)   Resp 16   Ht 5\' 2"  (1.575 m)   Wt 69.4 kg   SpO2 96%   BMI 27.98 kg/m   Physical Exam Vitals and nursing note reviewed.  Constitutional:      Appearance: Normal appearance. She is well-developed.  HENT:     Head: Normocephalic and atraumatic.  Eyes:     General: Lids are normal.     Conjunctiva/sclera: Conjunctivae normal.     Pupils: Pupils are equal, round, and reactive to light.  Cardiovascular:     Rate and Rhythm: Normal rate and regular rhythm.     Pulses: Normal pulses.     Heart sounds: Normal heart sounds. No murmur. No friction rub. No gallop.   Pulmonary:     Effort: Pulmonary effort is normal.     Breath sounds: Wheezing  present.     Comments: Mild wheezing noted.  No evidence of respiratory distress. Abdominal:     Palpations: Abdomen is soft. Abdomen is not rigid.     Tenderness: There is abdominal tenderness in the right lower quadrant, suprapubic area and left lower quadrant. There is no right CVA tenderness, left CVA tenderness or guarding.     Comments: Abdomen soft, nondistended.  Mild tenderness in the lower abdomen, right slightly greater than left.  Tenderness to the right lower quadrant is diffuse.  No focal tenderness noted to McBurney's point.  Negative Rovsing, rebounding.  No rigidity, guarding.  Musculoskeletal:        General: Normal range of motion.  Cervical back: Full passive range of motion without pain.  Skin:    General: Skin is warm and dry.     Capillary Refill: Capillary refill takes less than 2 seconds.  Neurological:     Mental Status: She is alert and oriented to person, place, and time.  Psychiatric:        Speech: Speech normal.     ED Results / Procedures / Treatments   Labs (all labs ordered are listed, but only abnormal results are displayed) Labs Reviewed  COMPREHENSIVE METABOLIC PANEL - Abnormal; Notable for the following components:      Result Value   Glucose, Bld 105 (*)    Total Protein 8.4 (*)    All other components within normal limits  CBC - Abnormal; Notable for the following components:   WBC 15.6 (*)    Hemoglobin 15.3 (*)    All other components within normal limits  LIPASE, BLOOD  URINALYSIS, ROUTINE W REFLEX MICROSCOPIC  I-STAT BETA HCG BLOOD, ED ([facility], [facility], [facility] ONLY)    EKG None  Radiology DG Chest 2 View  Result Date: 07/03/2019 CLINICAL DATA:  Cough, shortness of breath for 1 month, emphysema EXAM: CHEST - 2 VIEW COMPARISON:  04/04/2018 FINDINGS: Frontal and lateral views of the chest demonstrate stable background emphysema with right basilar scarring. No airspace disease, effusion, or pneumothorax. Cardiac silhouette is unremarkable.  IMPRESSION: No active cardiopulmonary disease. Electronically Signed   By: Randa Ngo M.D.   On: 07/03/2019 17:12    Procedures Procedures (including critical care time)  Medications Ordered in ED Medications  sodium chloride flush (NS) 0.9 % injection 3 mL (3 mLs Intravenous Given 07/03/19 1857)  methylPREDNISolone sodium succinate (SOLU-MEDROL) 125 mg/2 mL injection 125 mg (125 mg Intravenous Given 07/03/19 1855)  albuterol (VENTOLIN HFA) 108 (90 Base) MCG/ACT inhaler 4 puff (4 puffs Inhalation Given 07/03/19 1856)    ED Course  I have reviewed the triage vital signs and the nursing notes.  Pertinent labs & imaging results that were available during my care of the patient were reviewed by me and considered in my medical decision making (see chart for details).    MDM Rules/Calculators/A&P                      59 year old female with past history of COPD, hypertension who presents for evaluation of cough x1 month 3 days.  No associated fevers, nausea/vomiting, diarrhea.  On initially arrival, she is afebrile, nontoxic-appearing.  Vital signs are stable.  On exam, she does have obvious wheezing noted.  No evidence of respiratory distress.  She has some mild tenderness noted to the lower abdomen, right skull slightly greater than left.  Consider COPD exacerbation versus infectious etiology.  Also consider infectious intra-abdominal pathology such as appendicitis, GU etiology.  Plan to check labs, chest x-ray.  UA negative for any infectious etiology.  No hemoglobin.  I-STAT beta is negative.  CMP shows BUN and creatinine.  CBC shows leukocytosis of 15.6, hemoglobin stable at 15.3.  Chest x-ray negative for any infectious etiology.  Discussed results with patient.  Reevaluation shows she has improvement in wheezing.  Patient has eaten a sandwich and soda and chips here in the emergency department.  Repeat abdominal exam still shows some mild tenderness that is slightly worse in the right lower  quadrant.  I discussed her findings with her and explained to her my clinical concern about her leukocytosis and her pain.  I discussed with her  that I would like to get a CT abdomen pelvis to ensure that she does not have any infectious process such as appendicitis.  After extensive discussion, patient does not wish to have a CT scan done today.  I did discuss with patient the risk first benefits of declining a CT scan, including but not limited to missed infection, worsening condition, death.  Patient expressed understanding.  She exhibits full medical decision-making capacity.  I again offered her a CT on pelvis but she declined.  She states she would rather go home and will watch her symptoms and see if they improve.  She had told me that her abdominal pain had improved today prior to coming to the emergency department.  I discussed with patient that if she has any worsening or concerning symptoms, she is to return the emergency department immediately. Patient had ample opportunity for questions and discussion. All patient's questions were answered with full understanding. Strict return precautions discussed. Patient expresses understanding and agreement to plan.   Portions of this note were generated with Scientist, clinical (histocompatibility and immunogenetics). Dictation errors may occur despite best attempts at proofreading.  Final Clinical Impression(s) / ED Diagnoses Final diagnoses:  Cough  Lower abdominal pain  Leukocytosis, unspecified type    Rx / DC Orders ED Discharge Orders         Ordered    predniSONE (DELTASONE) 20 MG tablet  Daily     07/03/19 1932    albuterol (PROVENTIL) (2.5 MG/3ML) 0.083% nebulizer solution  Every 6 hours PRN     07/03/19 1932           Maxwell Caul, PA-C 07/04/19 0054    Maia Plan, MD 07/04/19 1019

## 2019-07-03 NOTE — Progress Notes (Signed)
Patient verified DOB Patient has not eaten today. Patient has not taken medication today Patient complains of swelling on the right side. Patient complains of area being hot last night. Patient complains area is tender. Patient denies N/V. Pain has been present for 3 days. Pain has never been felt before. Patient complains when standing at random times she gets a sharp pain in the back that is crippling. Patient slipped on ice 3 weeks ago. Patient complains of coughing but does still smoke.

## 2019-07-03 NOTE — ED Triage Notes (Signed)
Patient c/o bilateral lower abdominal pain x 3 days.  Patient states she has been having a NP cough x 1 month.

## 2019-07-03 NOTE — ED Notes (Signed)
Patient was verbalized discharge instructions. Pt had no further questions at this time. NAD. 

## 2019-07-03 NOTE — ED Notes (Signed)
Pt requesting to eat and drink PA aware

## 2019-07-27 ENCOUNTER — Telehealth: Payer: Self-pay | Admitting: Family Medicine

## 2019-07-27 NOTE — Telephone Encounter (Signed)
Patient called and requested for a medication to help her with her cough. Patient stated that it is affecting her at work. Patient also requested for something to help her with her cough. Please follow up at your earliest convenience.

## 2019-07-28 NOTE — Telephone Encounter (Signed)
Patient has a upcoming appointment with Dr.Newlin on 07/31/2019

## 2019-07-30 ENCOUNTER — Other Ambulatory Visit: Payer: Self-pay

## 2019-07-30 ENCOUNTER — Inpatient Hospital Stay (HOSPITAL_COMMUNITY)
Admission: EM | Admit: 2019-07-30 | Discharge: 2019-08-03 | DRG: 190 | Disposition: A | Discharge status: Home / Self Care | Payer: Managed Care, Other (non HMO) | Attending: Internal Medicine | Admitting: Internal Medicine

## 2019-07-30 ENCOUNTER — Encounter (HOSPITAL_COMMUNITY): Payer: Self-pay | Admitting: Emergency Medicine

## 2019-07-30 DIAGNOSIS — R0902 Hypoxemia: Secondary | ICD-10-CM

## 2019-07-30 DIAGNOSIS — J9601 Acute respiratory failure with hypoxia: Secondary | ICD-10-CM | POA: Diagnosis present

## 2019-07-30 DIAGNOSIS — J9811 Atelectasis: Secondary | ICD-10-CM | POA: Diagnosis present

## 2019-07-30 DIAGNOSIS — E1165 Type 2 diabetes mellitus with hyperglycemia: Secondary | ICD-10-CM | POA: Diagnosis present

## 2019-07-30 DIAGNOSIS — Z79899 Other long term (current) drug therapy: Secondary | ICD-10-CM

## 2019-07-30 DIAGNOSIS — J441 Chronic obstructive pulmonary disease with (acute) exacerbation: Secondary | ICD-10-CM

## 2019-07-30 DIAGNOSIS — R0602 Shortness of breath: Secondary | ICD-10-CM

## 2019-07-30 DIAGNOSIS — Z87442 Personal history of urinary calculi: Secondary | ICD-10-CM

## 2019-07-30 DIAGNOSIS — Z8249 Family history of ischemic heart disease and other diseases of the circulatory system: Secondary | ICD-10-CM

## 2019-07-30 DIAGNOSIS — E876 Hypokalemia: Secondary | ICD-10-CM | POA: Diagnosis present

## 2019-07-30 DIAGNOSIS — J9600 Acute respiratory failure, unspecified whether with hypoxia or hypercapnia: Secondary | ICD-10-CM | POA: Diagnosis present

## 2019-07-30 DIAGNOSIS — Z20822 Contact with and (suspected) exposure to covid-19: Secondary | ICD-10-CM | POA: Diagnosis present

## 2019-07-30 DIAGNOSIS — F17210 Nicotine dependence, cigarettes, uncomplicated: Secondary | ICD-10-CM | POA: Diagnosis present

## 2019-07-30 DIAGNOSIS — D72829 Elevated white blood cell count, unspecified: Secondary | ICD-10-CM | POA: Diagnosis present

## 2019-07-30 DIAGNOSIS — I10 Essential (primary) hypertension: Secondary | ICD-10-CM | POA: Diagnosis present

## 2019-07-30 DIAGNOSIS — Z720 Tobacco use: Secondary | ICD-10-CM | POA: Diagnosis present

## 2019-07-30 LAB — CBC
HCT: 44.3 % (ref 36.0–46.0)
Hemoglobin: 14.8 g/dL (ref 12.0–15.0)
MCH: 32.2 pg (ref 26.0–34.0)
MCHC: 33.4 g/dL (ref 30.0–36.0)
MCV: 96.5 fL (ref 80.0–100.0)
Platelets: 197 10*3/uL (ref 150–400)
RBC: 4.59 MIL/uL (ref 3.87–5.11)
RDW: 12.7 % (ref 11.5–15.5)
WBC: 6.8 10*3/uL (ref 4.0–10.5)
nRBC: 0 % (ref 0.0–0.2)

## 2019-07-30 MED ORDER — ALBUTEROL SULFATE HFA 108 (90 BASE) MCG/ACT IN AERS
6.0000 | INHALATION_SPRAY | Freq: Once | RESPIRATORY_TRACT | Status: AC
  Administered 2019-07-31: 6 via RESPIRATORY_TRACT
  Filled 2019-07-30: qty 6.7

## 2019-07-30 MED ORDER — AEROCHAMBER PLUS FLO-VU LARGE MISC
1.0000 | Freq: Once | Status: AC
  Administered 2019-07-31: 1

## 2019-07-30 MED ORDER — IPRATROPIUM BROMIDE HFA 17 MCG/ACT IN AERS
2.0000 | INHALATION_SPRAY | Freq: Once | RESPIRATORY_TRACT | Status: AC
  Administered 2019-07-31: 2 via RESPIRATORY_TRACT
  Filled 2019-07-30: qty 12.9

## 2019-07-30 NOTE — ED Provider Notes (Signed)
[facility] EMERGENCY DEPARTMENT Provider Note  CSN: [id number] Arrival date & time: 07/30/19 2142  Chief Complaint(s) Shortness of Breath  HPI Marissa Owens is a 59 y.o. female   The history is provided by the patient.  Shortness of Breath Severity:  Moderate Onset quality:  Gradual Duration:  2 days Timing:  Constant Progression:  Worsening Chronicity:  Recurrent Context: activity   Relieved by:  Rest Worsened by:  Movement, exertion, coughing and activity Associated symptoms: cough and wheezing   Associated symptoms: no chest pain, no fever, no sputum production and no vomiting   Risk factors: tobacco use     Past Medical History Past Medical History:  Diagnosis Date  . COPD (chronic obstructive pulmonary disease) (HCC)   . Diabetes mellitus without complication (HCC)   . Hypertension   . Renal disorder    kidney stone   Patient Active Problem List   Diagnosis Date Noted  . Acute respiratory failure (HCC) 07/13/2017  . Tobacco abuse 07/13/2017  . COPD exacerbation (HCC) 07/11/2017   Home Medication(s) Prior to Admission medications   Medication Sig Start Date End Date Taking? Authorizing Provider  albuterol (PROVENTIL) (2.5 MG/3ML) 0.083% nebulizer solution Take 3 mLs (2.5 mg total) by nebulization every 6 (six) hours as needed for wheezing or shortness of breath. 07/03/19  Yes Graciella Freer A, PA-C  amLODipine (NORVASC) 10 MG tablet Take 1 tablet (10 mg total) by mouth daily. Patient not taking: Reported on 07/31/2019 05/22/19 08/20/19  Darr, Veryl Speak, PA-C  doxycycline (VIBRA-TABS) 100 MG tablet Take 1 tablet (100 mg total) by mouth 2 (two) times daily. Patient not taking: Reported on 07/31/2019 07/03/19   Fulp, Hewitt Shorts, MD  FLUoxetine (PROZAC) 20 MG tablet Take 3 tablets (60 mg total) by mouth daily. Patient not taking: Reported on 07/31/2019 01/10/19   Eustace Moore, MD  ibuprofen (ADVIL) 800 MG tablet Take 1 tablet (800 mg total) by mouth 3  (three) times daily. Patient not taking: Reported on 07/03/2019 01/10/19   Eustace Moore, MD  ipratropium-albuterol (DUONEB) 0.5-2.5 (3) MG/3ML SOLN Take 3 mLs by nebulization every 6 (six) hours as needed for up to 7 days. Patient not taking: Reported on 07/31/2019 07/03/19 07/30/28  Cain Saupe, MD  Nebulizers (COMPRESSOR/NEBULIZER) MISC 1 machine Dx : COPD 07/13/17   Joseph Art, DO  Respiratory Therapy Supplies (ADULT MASK) MISC Needs an mask for Marissa Owens nebulizer machine 07/03/19   Fulp, Cammie, MD  risperiDONE (RISPERDAL) 2 MG tablet Take 1 tablet (2 mg total) by mouth 2 (two) times daily. Patient not taking: Reported on 07/03/2019 01/10/19   Eustace Moore, MD  Tiotropium Bromide Monohydrate (SPIRIVA RESPIMAT) 2.5 MCG/ACT AERS Inhale 2 puffs into the lungs daily. Patient not taking: Reported on 07/31/2019 04/14/18 07/30/28  Anders Simmonds, PA-C  tiZANidine (ZANAFLEX) 4 MG tablet Take 1-2 tablets (4-8 mg total) by mouth every 6 (six) hours as needed for muscle spasms. Patient not taking: Reported on 07/03/2019 01/10/19   Eustace Moore, MD  umeclidinium bromide (INCRUSE ELLIPTA) 62.5 MCG/INH AEPB Inhale 1 puff into the lungs daily. Patient not taking: Reported on 07/03/2019 04/14/18   Hoy Register, MD  furosemide (LASIX) 20 MG tablet Take 1 tablet (20 mg total) by mouth daily for 7 days. Patient not taking: Reported on 01/10/2019 04/14/18 01/10/19  Anders Simmonds, PA-C  metFORMIN (GLUCOPHAGE) 500 MG tablet Take 1 tablet (500 mg total) by mouth daily with breakfast. Patient not taking: Reported on 01/10/2019  04/14/18 01/10/19  Anders Simmonds, PA-C                                                                                                                                    Past Surgical History Past Surgical History:  Procedure Laterality Date  . TUBAL LIGATION     Family History Family History  Problem Relation Age of Onset  . Cancer Mother   . Hypertension Mother     Social  History Social History   Tobacco Use  . Smoking status: Former Smoker    Packs/day: 1.00    Types: Cigarettes  . Smokeless tobacco: Never Used  Substance Use Topics  . Alcohol use: No  . Drug use: No   Allergies Patient has no known allergies.  Review of Systems Review of Systems  Constitutional: Negative for fever.  Respiratory: Positive for cough, shortness of breath and wheezing. Negative for sputum production.   Cardiovascular: Negative for chest pain.  Gastrointestinal: Negative for vomiting.   All other systems are reviewed and are negative for acute change except as noted in the HPI  Physical Exam Vital Signs  I have reviewed the triage vital signs BP (!) 130/91   Pulse 81   Temp 98.7 F (37.1 C) (Oral)   Resp 20   SpO2 91%   Physical Exam Vitals reviewed.  Constitutional:      General: Marissa Owens is not in acute distress.    Appearance: Marissa Owens is well-developed. Marissa Owens is not diaphoretic.  HENT:     Head: Normocephalic and atraumatic.     Nose: Nose normal.  Eyes:     General: No scleral icterus.       Right eye: No discharge.        Left eye: No discharge.     Conjunctiva/sclera: Conjunctivae normal.     Pupils: Pupils are equal, round, and reactive to light.  Cardiovascular:     Rate and Rhythm: Normal rate and regular rhythm.     Heart sounds: No murmur. No friction rub. No gallop.   Pulmonary:     Effort: Pulmonary effort is normal. No respiratory distress.     Breath sounds: Transmitted upper airway sounds present. No stridor. Rhonchi present. No rales.  Abdominal:     General: There is no distension.     Palpations: Abdomen is soft.     Tenderness: There is no abdominal tenderness.  Musculoskeletal:        General: No tenderness.     Cervical back: Normal range of motion and neck supple.  Skin:    General: Skin is warm and dry.     Findings: No erythema or rash.  Neurological:     Mental Status: Marissa Owens is alert and oriented to person, place, and time.      ED Results and Treatments Labs (all labs ordered are listed, but only abnormal results are displayed) Labs Reviewed  BASIC  METABOLIC PANEL - Abnormal; Notable for the following components:      Result Value   Glucose, Bld 117 (*)    BUN <5 (*)    All other components within normal limits  SARS CORONAVIRUS 2 (TAT 6-24 HRS)  CBC  I-STAT BETA HCG BLOOD, ED ([facility], [facility], [facility] ONLY)                                                                                                                         EKG  EKG Interpretation  Date/Time:  Sunday July 30 2019 23:27:06 EST Ventricular Rate:  74 PR Interval:    QRS Duration: 84 QT Interval:  381 QTC Calculation: 423 R Axis:   67 Text Interpretation: Sinus rhythm Borderline prolonged PR interval No acute changes Confirmed by Drema Pry [phone]) on 07/30/2019 11:45:58 PM      Radiology DG Chest 2 View  Result Date: 07/31/2019 CLINICAL DATA:  COPD. EXAM: CHEST - 2 VIEW COMPARISON:  07/03/2019 FINDINGS: The heart size is stable. Aortic calcifications are noted. There is stable atelectasis versus scarring at the left lung base. The lungs are somewhat hyperexpanded. There is no pneumothorax or large focal infiltrate. No pleural effusion. No acute osseous abnormality. IMPRESSION: No acute cardiopulmonary disease. Stable atelectasis versus scarring at the left lung base. Hyperexpanded lungs which can be seen in patients with COPD. Electronically Signed   By: Katherine Mantle M.D.   On: 07/31/2019 00:48    Pertinent labs & imaging results that were available during my care of the patient were reviewed by me and considered in my medical decision making (see chart for details).  Medications Ordered in ED Medications  sodium chloride flush (NS) 0.9 % injection 3 mL (has no administration in time range)  sodium chloride flush (NS) 0.9 % injection 3 mL (has no administration in time range)  0.9 %  sodium chloride infusion (has no administration in  time range)  azithromycin (ZITHROMAX) 500 mg in sodium chloride 0.9 % 250 mL IVPB (has no administration in time range)    Followed by  azithromycin (ZITHROMAX) tablet 500 mg (has no administration in time range)  methylPREDNISolone sodium succinate (SOLU-MEDROL) 125 mg/2 mL injection 80 mg (has no administration in time range)  ipratropium-albuterol (DUONEB) 0.5-2.5 (3) MG/3ML nebulizer solution 3 mL (has no administration in time range)  AeroChamber Plus Flo-Vu Large MISC 1 each (1 each Other Given 07/31/19 0007)  albuterol (VENTOLIN HFA) 108 (90 Base) MCG/ACT inhaler 6 puff (6 puffs Inhalation Given 07/31/19 0007)  ipratropium (ATROVENT HFA) inhaler 2 puff (2 puffs Inhalation Given 07/31/19 0007)  methylPREDNISolone sodium succinate (SOLU-MEDROL) 125 mg/2 mL injection 125 mg (125 mg Intravenous Given 07/31/19 0427)  magnesium sulfate IVPB 2 g 50 mL (0 g Intravenous Stopped 07/31/19 0539)  azithromycin (ZITHROMAX) tablet 500 mg (500 mg Oral Given 07/31/19 0428)  albuterol (VENTOLIN HFA) 108 (90 Base) MCG/ACT inhaler 6 puff (6 puffs Inhalation Given 07/31/19 0428)  Procedures .Critical Care Performed by: Fatima Blank, MD Authorized by: Fatima Blank, MD    CRITICAL CARE Performed by: Grayce Sessions Caidynce Muzyka Total critical care time: 45 minutes Critical care time was exclusive of separately billable procedures and treating other patients. Critical care was necessary to treat or prevent imminent or life-threatening deterioration. Critical care was time spent personally by me on the following activities: development of treatment plan with patient and/or surrogate as well as nursing, discussions with consultants, evaluation of patient's response to treatment, examination of patient, obtaining history from patient or surrogate, ordering and performing treatments  and interventions, ordering and review of laboratory studies, ordering and review of radiographic studies, pulse oximetry and re-evaluation of patient's condition.   (including critical care time)  Medical Decision Making / ED Course I have reviewed the nursing notes for this encounter and the patient's prior records (if available in EHR or on provided paperwork).   Marissa Owens was evaluated in Emergency Department on 07/31/2019 for the symptoms described in the history of present illness. Marissa Owens was evaluated in the context of the global COVID-19 pandemic, which necessitated consideration that the patient might be at risk for infection with the SARS-CoV-2 virus that causes COVID-19. Institutional protocols and algorithms that pertain to the evaluation of patients at risk for COVID-19 are in a state of rapid change based on information released by regulatory bodies including the CDC and federal and state organizations. These policies and algorithms were followed during the patient's care in the ED.  2 days of gradually worsening shortness of breath.  Nonproductive cough.  Patient is afebrile, normotensive with increased respiratory rate and sats in the high 80s to low 90s while at rest.  Ambulatory pulse ox drops to low 80s.  Lungs with diffuse rhonchi.  Chest x-ray without evidence of pneumonia, pneumothorax, or pulmonary edema.  Presentation is most consistent with COPD exacerbation.  Doubt PE or cardiac etiology.  Patient treated with several rounds of albuterol/Atrovent puffs.  IV Solu-Medrol given.  Attempted to ambulate, but oxygen still drops to the low 80s.  Added magnesium and azithromycin.  Patient placed on 2 L nasal cannula. Admitted to medicine for continued work-up and management.      Final Clinical Impression(s) / ED Diagnoses Final diagnoses:  SOB (shortness of breath)  Hypoxia  COPD exacerbation ([place])      This chart was dictated using voice recognition software.   Despite best efforts to proofread,  errors can occur which can change the documentation meaning.   Fatima Blank, MD 07/31/19 [phone]

## 2019-07-30 NOTE — ED Triage Notes (Signed)
Pt reports her "COPD is real bad" X2days.  Pt has a cough and despite using her breathing treatments is unable to get her breathing under control.

## 2019-07-31 ENCOUNTER — Emergency Department (HOSPITAL_COMMUNITY): Payer: Managed Care, Other (non HMO)

## 2019-07-31 ENCOUNTER — Encounter (HOSPITAL_COMMUNITY): Payer: Self-pay | Admitting: Family Medicine

## 2019-07-31 ENCOUNTER — Other Ambulatory Visit: Payer: Self-pay

## 2019-07-31 ENCOUNTER — Ambulatory Visit: Payer: Managed Care, Other (non HMO) | Admitting: Family Medicine

## 2019-07-31 DIAGNOSIS — F17210 Nicotine dependence, cigarettes, uncomplicated: Secondary | ICD-10-CM | POA: Diagnosis present

## 2019-07-31 DIAGNOSIS — J441 Chronic obstructive pulmonary disease with (acute) exacerbation: Principal | ICD-10-CM

## 2019-07-31 DIAGNOSIS — E876 Hypokalemia: Secondary | ICD-10-CM | POA: Diagnosis present

## 2019-07-31 DIAGNOSIS — I10 Essential (primary) hypertension: Secondary | ICD-10-CM | POA: Diagnosis present

## 2019-07-31 DIAGNOSIS — Z8249 Family history of ischemic heart disease and other diseases of the circulatory system: Secondary | ICD-10-CM | POA: Diagnosis not present

## 2019-07-31 DIAGNOSIS — J9811 Atelectasis: Secondary | ICD-10-CM | POA: Diagnosis present

## 2019-07-31 DIAGNOSIS — Z79899 Other long term (current) drug therapy: Secondary | ICD-10-CM | POA: Diagnosis not present

## 2019-07-31 DIAGNOSIS — J9601 Acute respiratory failure with hypoxia: Secondary | ICD-10-CM | POA: Diagnosis not present

## 2019-07-31 DIAGNOSIS — E1165 Type 2 diabetes mellitus with hyperglycemia: Secondary | ICD-10-CM | POA: Diagnosis present

## 2019-07-31 DIAGNOSIS — Z87442 Personal history of urinary calculi: Secondary | ICD-10-CM | POA: Diagnosis not present

## 2019-07-31 DIAGNOSIS — R0602 Shortness of breath: Secondary | ICD-10-CM | POA: Diagnosis present

## 2019-07-31 DIAGNOSIS — Z20822 Contact with and (suspected) exposure to covid-19: Secondary | ICD-10-CM | POA: Diagnosis present

## 2019-07-31 DIAGNOSIS — D72829 Elevated white blood cell count, unspecified: Secondary | ICD-10-CM | POA: Diagnosis present

## 2019-07-31 DIAGNOSIS — Z720 Tobacco use: Secondary | ICD-10-CM | POA: Diagnosis not present

## 2019-07-31 LAB — CBC WITH DIFFERENTIAL/PLATELET
Abs Immature Granulocytes: 0.03 10*3/uL (ref 0.00–0.07)
Basophils Absolute: 0 10*3/uL (ref 0.0–0.1)
Basophils Relative: 1 %
Eosinophils Absolute: 0 10*3/uL (ref 0.0–0.5)
Eosinophils Relative: 0 %
HCT: 46.2 % — ABNORMAL HIGH (ref 36.0–46.0)
Hemoglobin: 15 g/dL (ref 12.0–15.0)
Immature Granulocytes: 1 %
Lymphocytes Relative: 15 %
Lymphs Abs: 0.7 10*3/uL (ref 0.7–4.0)
MCH: 31.7 pg (ref 26.0–34.0)
MCHC: 32.5 g/dL (ref 30.0–36.0)
MCV: 97.7 fL (ref 80.0–100.0)
Monocytes Absolute: 0.1 10*3/uL (ref 0.1–1.0)
Monocytes Relative: 2 %
Neutro Abs: 3.9 10*3/uL (ref 1.7–7.7)
Neutrophils Relative %: 81 %
Platelets: 197 10*3/uL (ref 150–400)
RBC: 4.73 MIL/uL (ref 3.87–5.11)
RDW: 12.7 % (ref 11.5–15.5)
WBC: 4.8 10*3/uL (ref 4.0–10.5)
nRBC: 0 % (ref 0.0–0.2)

## 2019-07-31 LAB — BASIC METABOLIC PANEL
Anion gap: 11 (ref 5–15)
BUN: <5 mg/dL — ABNORMAL LOW (ref 6–20)
CO2: 27 mmol/L (ref 22–32)
Calcium: 9.2 mg/dL (ref 8.9–10.3)
Chloride: 104 mmol/L (ref 98–111)
Creatinine, Ser: 0.8 mg/dL (ref 0.44–1.00)
GFR calc Af Amer: >60 mL/min (ref >60)
GFR calc non Af Amer: >60 mL/min (ref >60)
Glucose, Bld: 117 mg/dL — ABNORMAL HIGH (ref 70–99)
Potassium: 3.5 mmol/L (ref 3.5–5.1)
Sodium: 142 mmol/L (ref 135–145)

## 2019-07-31 LAB — LIPID PANEL
Cholesterol: 181 mg/dL (ref 0–200)
HDL: 38 mg/dL — ABNORMAL LOW (ref >40)
LDL Cholesterol: 125 mg/dL — ABNORMAL HIGH (ref 0–99)
Total CHOL/HDL Ratio: 4.8 RATIO
Triglycerides: 92 mg/dL (ref <150)
VLDL: 18 mg/dL (ref 0–40)

## 2019-07-31 LAB — I-STAT BETA HCG BLOOD, ED (MC, WL, AP ONLY): I-stat hCG, quantitative: <5 m[IU]/mL (ref <5)

## 2019-07-31 LAB — HEMOGLOBIN A1C
Hgb A1c MFr Bld: 6 % — ABNORMAL HIGH (ref 4.8–5.6)
Mean Plasma Glucose: 125.5 mg/dL

## 2019-07-31 LAB — SARS CORONAVIRUS 2 (TAT 6-24 HRS): SARS Coronavirus 2: NEGATIVE

## 2019-07-31 LAB — STREP PNEUMONIAE URINARY ANTIGEN: Strep Pneumo Urinary Antigen: NEGATIVE

## 2019-07-31 MED ORDER — SODIUM CHLORIDE 0.9 % IV SOLN
500.0000 mg | INTRAVENOUS | Status: AC
  Administered 2019-07-31: 500 mg via INTRAVENOUS
  Filled 2019-07-31: qty 500

## 2019-07-31 MED ORDER — AZITHROMYCIN 250 MG PO TABS
500.0000 mg | ORAL_TABLET | Freq: Once | ORAL | Status: AC
  Administered 2019-07-31: 500 mg via ORAL
  Filled 2019-07-31: qty 2

## 2019-07-31 MED ORDER — GUAIFENESIN-DM 100-10 MG/5ML PO SYRP
5.0000 mL | ORAL_SOLUTION | ORAL | Status: DC | PRN
  Administered 2019-07-31 – 2019-08-02 (×3): 5 mL via ORAL
  Filled 2019-07-31 (×3): qty 5

## 2019-07-31 MED ORDER — ALBUTEROL SULFATE HFA 108 (90 BASE) MCG/ACT IN AERS
6.0000 | INHALATION_SPRAY | Freq: Once | RESPIRATORY_TRACT | Status: AC
  Administered 2019-07-31: 6 via RESPIRATORY_TRACT

## 2019-07-31 MED ORDER — METHYLPREDNISOLONE SODIUM SUCC 125 MG IJ SOLR
125.0000 mg | Freq: Once | INTRAMUSCULAR | Status: AC
  Administered 2019-07-31: 125 mg via INTRAVENOUS
  Filled 2019-07-31: qty 2

## 2019-07-31 MED ORDER — MAGNESIUM SULFATE 2 GM/50ML IV SOLN
2.0000 g | Freq: Once | INTRAVENOUS | Status: AC
  Administered 2019-07-31: 2 g via INTRAVENOUS
  Filled 2019-07-31: qty 50

## 2019-07-31 MED ORDER — NICOTINE 21 MG/24HR TD PT24
21.0000 mg | MEDICATED_PATCH | Freq: Every day | TRANSDERMAL | Status: DC
  Administered 2019-07-31 – 2019-08-03 (×4): 21 mg via TRANSDERMAL
  Filled 2019-07-31 (×4): qty 1

## 2019-07-31 MED ORDER — AZITHROMYCIN 250 MG PO TABS
500.0000 mg | ORAL_TABLET | Freq: Every day | ORAL | Status: DC
  Administered 2019-08-01 – 2019-08-03 (×3): 500 mg via ORAL
  Filled 2019-07-31 (×3): qty 2

## 2019-07-31 MED ORDER — ENOXAPARIN SODIUM 40 MG/0.4ML ~~LOC~~ SOLN
40.0000 mg | SUBCUTANEOUS | Status: DC
  Administered 2019-07-31 – 2019-08-02 (×3): 40 mg via SUBCUTANEOUS
  Filled 2019-07-31 (×3): qty 0.4

## 2019-07-31 MED ORDER — METHYLPREDNISOLONE SODIUM SUCC 125 MG IJ SOLR
80.0000 mg | Freq: Two times a day (BID) | INTRAMUSCULAR | Status: DC
  Administered 2019-07-31 – 2019-08-03 (×6): 80 mg via INTRAVENOUS
  Filled 2019-07-31 (×6): qty 2

## 2019-07-31 MED ORDER — PHENOL 1.4 % MT LIQD
1.0000 | OROMUCOSAL | Status: DC | PRN
  Administered 2019-07-31: 1 via OROMUCOSAL
  Filled 2019-07-31: qty 177

## 2019-07-31 MED ORDER — IPRATROPIUM-ALBUTEROL 0.5-2.5 (3) MG/3ML IN SOLN
3.0000 mL | Freq: Four times a day (QID) | RESPIRATORY_TRACT | Status: DC
  Administered 2019-07-31 – 2019-08-02 (×7): 3 mL via RESPIRATORY_TRACT
  Filled 2019-07-31 (×8): qty 3

## 2019-07-31 MED ORDER — SODIUM CHLORIDE 0.9% FLUSH
3.0000 mL | INTRAVENOUS | Status: DC | PRN
  Administered 2019-08-01: 3 mL via INTRAVENOUS

## 2019-07-31 MED ORDER — SODIUM CHLORIDE 0.9% FLUSH
3.0000 mL | Freq: Two times a day (BID) | INTRAVENOUS | Status: DC
  Administered 2019-07-31 – 2019-08-03 (×6): 3 mL via INTRAVENOUS

## 2019-07-31 MED ORDER — SODIUM CHLORIDE 0.9 % IV SOLN: 250.0000 mL | INTRAVENOUS | Status: DC | PRN

## 2019-07-31 MED ORDER — IPRATROPIUM-ALBUTEROL 0.5-2.5 (3) MG/3ML IN SOLN
3.0000 mL | RESPIRATORY_TRACT | Status: DC | PRN
  Administered 2019-08-02: 3 mL via RESPIRATORY_TRACT
  Filled 2019-07-31: qty 3

## 2019-07-31 NOTE — Evaluation (Signed)
Physical Therapy Evaluation Patient Details Name: Marissa Owens MRN: [id number] DOB: 1961/04/08 Today's Date: 07/31/2019   History of Present Illness  Pt is a 59 y/o female admitted secondary to COPD exacerbation. PMH includes COPD, DM, HTN, and tobacco abuse.   Clinical Impression  Pt admitted secondary to problem above with deficits below. Pt with increased wheezing and SOB which limited gait to the room. Pt's oxygen at 96-97% on 3L. Feel pt will progress well once symptoms improve and not require PT follow up. Will continue to follow acutely to maximize functional mobility independence and safety.     Follow Up Recommendations No PT follow up    Equipment Recommendations  None recommended by PT    Recommendations for Other Services       Precautions / Restrictions Precautions Precautions: Other (comment) Precaution Comments: Watch O2 sats Restrictions Weight Bearing Restrictions: No      Mobility  Bed Mobility Overal bed mobility: Modified Independent                Transfers Overall transfer level: Needs assistance Equipment used: None Transfers: Sit to/from Stand Sit to Stand: Min guard         General transfer comment: Min guard for safety to stand from higher stretcher height.   Ambulation/Gait Ambulation/Gait assistance: Min guard Gait Distance (Feet): 10 Feet Assistive device: None Gait Pattern/deviations: Step-through pattern;Decreased stride length Gait velocity: Decreased   General Gait Details: Slow, cautious gait within the room. Pt with increased wheeziness and coughing which limited mobility. Oxygen sats at 96-97% on 3L of O2.   Stairs            Wheelchair Mobility    Modified Rankin (Stroke Patients Only)       Balance Overall balance assessment: Needs assistance Sitting-balance support: No upper extremity supported;Feet supported Sitting balance-Leahy Scale: Good     Standing balance support: No upper extremity  supported;During functional activity Standing balance-Leahy Scale: Fair                               Pertinent Vitals/Pain Pain Assessment: No/denies pain    Home Living Family/patient expects to be discharged to:: Private residence Living Arrangements: Other relatives Available Help at Discharge: Family;Available 24 hours/day Type of Home: House Home Access: Level entry     Home Layout: One level Home Equipment: None      Prior Function Level of Independence: Independent         Comments: Works as a Dispensing optician Extremity Assessment Upper Extremity Assessment: Defer to OT evaluation    Lower Extremity Assessment Lower Extremity Assessment: Overall WFL for tasks assessed    Cervical / Trunk Assessment Cervical / Trunk Assessment: Normal  Communication   Communication: No difficulties  Cognition Arousal/Alertness: Awake/alert Behavior During Therapy: WFL for tasks assessed/performed Overall Cognitive Status: Within Functional Limits for tasks assessed                                        General Comments      Exercises     Assessment/Plan    PT Assessment Patient needs continued PT services  PT Problem List Decreased mobility;Decreased activity tolerance;Cardiopulmonary status limiting activity;Decreased knowledge of precautions  PT Treatment Interventions Gait training;Therapeutic exercise;Functional mobility training;Therapeutic activities;Patient/family education    PT Goals (Current goals can be found in the Care Plan section)  Acute Rehab PT Goals Patient Stated Goal: to go back to work PT Goal Formulation: With patient Time For Goal Achievement: 08/14/19 Potential to Achieve Goals: Good    Frequency Min 3X/week   Barriers to discharge        Co-evaluation               AM-PAC PT "6 Clicks" Mobility  Outcome Measure Help needed turning  from your back to your side while in a flat bed without using bedrails?: None Help needed moving from lying on your back to sitting on the side of a flat bed without using bedrails?: None Help needed moving to and from a bed to a chair (including a wheelchair)?: A Little Help needed standing up from a chair using your arms (e.g., wheelchair or bedside chair)?: A Little Help needed to walk in hospital room?: A Little Help needed climbing 3-5 steps with a railing? : A Lot 6 Click Score: 19    End of Session Equipment Utilized During Treatment: Oxygen Activity Tolerance: Treatment limited secondary to medical complications (Comment)(wheezing, SOB) Patient left: in bed;with call bell/phone within reach Nurse Communication: Mobility status;Other (comment)(pt requests purewick ) PT Visit Diagnosis: Other abnormalities of gait and mobility (R26.89)    Time: 1359-1411 PT Time Calculation (min) (ACUTE ONLY): 12 min   Charges:   PT Evaluation $PT Eval Moderate Complexity: 1 Mod          Farley Ly, PT, DPT  Acute Rehabilitation Services  Pager: [phone] Office: [phone]   Lehman Prom 07/31/2019, 2:55 PM

## 2019-07-31 NOTE — Progress Notes (Signed)
PROGRESS NOTE    Marissa Owens  WIO:[id number] DOB: [birth year] DOA: 07/30/2019 PCP: Cain Saupe, MD     Brief Narrative:  Marissa Owens is a 59 y.o. BF PMHx HTN, nephrolithiasis, DM type II,, COPD, tobacco abuse   Comes in with several days of worsening shortness of breath and wheezing at home not relieved with her home albuterol inhaler.  She denies any fevers.  She denies nausea vomiting or diarrhea.  She is trying to quit smoking.  She does not require supplemental oxygen at home.  Patient ambulated dropped down to 84% on room air with ambulation therefore referred for admission for COPD exacerbation causing hypoxia.  She is feeling some better but not back to normal.    Subjective: A/O x4, smokes 1 PPD, positive S OB, positive DOE (does not use oxygen at home), negative CP   Assessment & Plan:   Principal Problem:   COPD exacerbation (HCC) Active Problems:   Acute respiratory failure (HCC)   Tobacco abuse  Acute respiratory failure with hypoxia COVID-19 Labs  No results for input(s): DDIMER, FERRITIN, LDH, CRP in the last 72 hours.  Lab Results  Component Value Date   SARSCOV2NAA NEGATIVE 07/31/2019   COPD exacerbation -Solu-Medrol 80 mg BID -DuoNeb QID -Flutter valve -Incentive spirometer -Sputum culture pending -Strep pneumo/Legionella urine antigen pending -Respiratory virus panel pending -Antibiotic x7 days -Titrate O2 to maintain SPO2> 88%   Tobacco abuse -Patient interested in tobacco cessation -Nicotine patch   Tobacco abuse-we will need continued encouragement and education on quitting smoking   DVT prophylaxis: Avonex per pharmacy Code Status: Full Family Communication:  Disposition Plan:    Consultants:    Procedures/Significant Events:    I have personally reviewed and interpreted all radiology studies and my findings are as above.  VENTILATOR SETTINGS:    Cultures 3/8 sputum pending 3/8 respiratory virus panel  pending  Antimicrobials:    Devices    LINES / TUBES:      Continuous Infusions: . sodium chloride    . azithromycin 500 mg (07/31/19 0758)     Objective: Vitals:   07/31/19 0715 07/31/19 0730 07/31/19 0745 07/31/19 0800  BP: (!) 129/102 (!) 137/101 (!) 138/96 (!) 140/103  Pulse: 81 88 84 85  Resp: (!) 28 (!) 23 (!) 26 (!) 25  Temp:      TempSrc:      SpO2: 93% 90% 92% 97%  Weight:      Height:       No intake or output data in the 24 hours ending 07/31/19 0826 Filed Weights   07/31/19 0436  Weight: 68 kg    Examination:  General: No acute respiratory distress Eyes: negative scleral hemorrhage, negative anisocoria, negative icterus ENT: Negative Runny nose, negative gingival bleeding, Neck:  Negative scars, masses, torticollis, lymphadenopathy, JVD Lungs: Decreased breath sounds, positive expiratory wheeze bilaterally without crackles Cardiovascular: Regular rate and rhythm without murmur gallop or rub normal S1 and S2 Abdomen: negative abdominal pain, nondistended, positive soft, bowel sounds, no rebound, no ascites, no appreciable mass Extremities: No significant cyanosis, clubbing, or edema bilateral lower extremities Skin: Negative rashes, lesions, ulcers Psychiatric:  Negative depression, negative anxiety, negative fatigue, negative mania  Central nervous system:  Cranial nerves II through XII intact, tongue/uvula midline, all extremities muscle strength 5/5, sensation intact throughout, negative dysarthria, negative expressive aphasia, negative receptive aphasia.  .     Data Reviewed: Care during the described time interval was provided by me .  I have  reviewed this patient's available data, including medical history, events of note, physical examination, and all test results as part of my evaluation.  CBC: Recent Labs  Lab 07/30/19 2333  WBC 6.8  HGB 14.8  HCT 44.3  MCV 96.5  PLT 154   Basic Metabolic Panel: Recent Labs  Lab 07/30/19 2333   NA 142  K 3.5  CL 104  CO2 27  GLUCOSE 117*  BUN <5*  CREATININE 0.80  CALCIUM 9.2   GFR: Estimated Creatinine Clearance: 70 mL/min (by C-G formula based on SCr of 0.8 mg/dL). Liver Function Tests: No results for input(s): AST, ALT, ALKPHOS, BILITOT, PROT, ALBUMIN in the last 168 hours. No results for input(s): LIPASE, AMYLASE in the last 168 hours. No results for input(s): AMMONIA in the last 168 hours. Coagulation Profile: No results for input(s): INR, PROTIME in the last 168 hours. Cardiac Enzymes: No results for input(s): CKTOTAL, CKMB, CKMBINDEX, TROPONINI in the last 168 hours. BNP (last 3 results) No results for input(s): PROBNP in the last 8760 hours. HbA1C: No results for input(s): HGBA1C in the last 72 hours. CBG: No results for input(s): GLUCAP in the last 168 hours. Lipid Profile: No results for input(s): CHOL, HDL, LDLCALC, TRIG, CHOLHDL, LDLDIRECT in the last 72 hours. Thyroid Function Tests: No results for input(s): TSH, T4TOTAL, FREET4, T3FREE, THYROIDAB in the last 72 hours. Anemia Panel: No results for input(s): VITAMINB12, FOLATE, FERRITIN, TIBC, IRON, RETICCTPCT in the last 72 hours. Sepsis Labs: No results for input(s): PROCALCITON, LATICACIDVEN in the last 168 hours.  No results found for this or any previous visit (from the past 240 hour(s)).       Radiology Studies: DG Chest 2 View  Result Date: 07/31/2019 CLINICAL DATA:  COPD. EXAM: CHEST - 2 VIEW COMPARISON:  07/03/2019 FINDINGS: The heart size is stable. Aortic calcifications are noted. There is stable atelectasis versus scarring at the left lung base. The lungs are somewhat hyperexpanded. There is no pneumothorax or large focal infiltrate. No pleural effusion. No acute osseous abnormality. IMPRESSION: No acute cardiopulmonary disease. Stable atelectasis versus scarring at the left lung base. Hyperexpanded lungs which can be seen in patients with COPD. Electronically Signed   By: Constance Holster M.D.   On: 07/31/2019 00:48        Scheduled Meds: . [START ON 08/01/2019] azithromycin  500 mg Oral Daily  . methylPREDNISolone (SOLU-MEDROL) injection  80 mg Intravenous Q12H  . sodium chloride flush  3 mL Intravenous Q12H   Continuous Infusions: . sodium chloride    . azithromycin 500 mg (07/31/19 0758)     LOS: 0 days    Time spent:40 min    Laurelyn Terrero, Geraldo Docker, MD [facility] Pager [phone]  If 7PM-7AM, please contact night-coverage [email or web address] Password [facility] 07/31/2019, 8:26 AM

## 2019-07-31 NOTE — ED Notes (Signed)
Pt ambulated in hallway, O2 SAT dropped to 84% on RA. O2 went up to 91% on RA when rested.

## 2019-07-31 NOTE — Progress Notes (Signed)
Sputum specimen container was given to patient and instructed patient on how to use. Patient stated she did not have anything to cough up at this time. Container left on bedside table within reach. Patient was told to call RT or RN if able to cough anything up.

## 2019-07-31 NOTE — Progress Notes (Signed)
ANTICOAGULATION CONSULT NOTE - Initial Consult  Pharmacy Consult for Enoxaparin Indication: VTE prophylaxis  No Known Allergies  Patient Measurements: Height: 5\' 3"  (160 cm) Weight: 149 lb 14.6 oz (68 kg) IBW/kg (Calculated) : 52.4  Vital Signs: BP: 151/105 (03/08 1300) Pulse Rate: 94 (03/08 1300)  Labs: Recent Labs    07/30/19 2333 07/31/19 0901  HGB 14.8 15.0  HCT 44.3 46.2*  PLT 197 197  CREATININE 0.80  --     Estimated Creatinine Clearance: 70 mL/min (by C-G formula based on SCr of 0.8 mg/dL).   Medical History: Past Medical History:  Diagnosis Date  . COPD (chronic obstructive pulmonary disease) (HCC)   . Diabetes mellitus without complication (HCC)   . Hypertension   . Renal disorder    kidney stone    Assessment: 59 yr old female admitted on 07/30/19 with SOB, COPD exacerbation. Pharmacy is consulted for Lovenox dosing for VTE prophylaxis.  H/H 15.0/46.2, platelets 197 (CBC stable); Scr 0.80, TBW CrCl ~80 ml/min; BMI 26.56  Goal of Therapy:  Prevention of VTE Monitor platelets by anticoagulation protocol: Yes   Plan:  Lovenox 40 mg SQ daily Monitor renal function, CBC Monitor for signs/symptoms of bleeding  05-12-1998, PharmD, BCPS, [facility] Clinical Pharmacist 07/31/2019,9:12 PM

## 2019-07-31 NOTE — ED Notes (Signed)
Lunch Tray Ordered @ 1038.  

## 2019-07-31 NOTE — ED Notes (Signed)
PT says she needs "something" to help her stop smoking. Attending paged to request

## 2019-07-31 NOTE — H&P (Signed)
History and Physical    Marissa Owens DVV:[id number] DOB: March 23, 1961 DOA: 07/30/2019  PCP: Antony Blackbird, MD  Patient coming from: Home  Chief Complaint: Shortness of breath  HPI: Marissa Owens is a 59 y.o. female with medical history significant of COPD, tobacco abuse comes in with several days of worsening shortness of breath and wheezing at home not relieved with her home albuterol inhaler.  She denies any fevers.  She denies nausea vomiting or diarrhea.  She is trying to quit smoking.  She does not require supplemental oxygen at home.  Patient ambulated dropped down to 84% on room air with ambulation therefore referred for admission for COPD exacerbation causing hypoxia.  She is feeling some better but not back to normal.  Review of Systems: As per HPI otherwise 10 point review of systems negative.   Past Medical History:  Diagnosis Date  . COPD (chronic obstructive pulmonary disease) ([place])   . Diabetes mellitus without complication ([place])   . Hypertension   . Renal disorder    kidney stone    Past Surgical History:  Procedure Laterality Date  . TUBAL LIGATION       reports that she has quit smoking. Her smoking use included cigarettes. She smoked 1.00 pack per day. She has never used smokeless tobacco. She reports that she does not drink alcohol or use drugs.  No Known Allergies  Family History  Problem Relation Age of Onset  . Cancer Mother   . Hypertension Mother     Prior to Admission medications   Medication Sig Start Date End Date Taking? Authorizing Provider  albuterol (PROVENTIL) (2.5 MG/3ML) 0.083% nebulizer solution Take 3 mLs (2.5 mg total) by nebulization every 6 (six) hours as needed for wheezing or shortness of breath. 07/03/19  Yes Providence Lanius A, PA-C  amLODipine (NORVASC) 10 MG tablet Take 1 tablet (10 mg total) by mouth daily. Patient not taking: Reported on 07/31/2019 05/22/19 08/20/19  Darr, Marguerita Beards, PA-C  doxycycline (VIBRA-TABS) 100 MG tablet Take  1 tablet (100 mg total) by mouth 2 (two) times daily. Patient not taking: Reported on 07/31/2019 07/03/19   Fulp, Ander Gaster, MD  FLUoxetine (PROZAC) 20 MG tablet Take 3 tablets (60 mg total) by mouth daily. Patient not taking: Reported on 07/31/2019 01/10/19   Raylene Everts, MD  ibuprofen (ADVIL) 800 MG tablet Take 1 tablet (800 mg total) by mouth 3 (three) times daily. Patient not taking: Reported on 07/03/2019 01/10/19   Raylene Everts, MD  ipratropium-albuterol (DUONEB) 0.5-2.5 (3) MG/3ML SOLN Take 3 mLs by nebulization every 6 (six) hours as needed for up to 7 days. Patient not taking: Reported on 07/31/2019 07/03/19 07/30/28  Antony Blackbird, MD  Nebulizers (COMPRESSOR/NEBULIZER) MISC 1 machine Dx : COPD 07/13/17   Geradine Girt, DO  Respiratory Therapy Supplies (ADULT MASK) [place] Needs an mask for her nebulizer machine 07/03/19   Fulp, Cammie, MD  risperiDONE (RISPERDAL) 2 MG tablet Take 1 tablet (2 mg total) by mouth 2 (two) times daily. Patient not taking: Reported on 07/03/2019 01/10/19   Raylene Everts, MD  Tiotropium Bromide Monohydrate (SPIRIVA RESPIMAT) 2.5 MCG/ACT AERS Inhale 2 puffs into the lungs daily. Patient not taking: Reported on 07/31/2019 04/14/18 07/30/28  Argentina Donovan, PA-C  tiZANidine (ZANAFLEX) 4 MG tablet Take 1-2 tablets (4-8 mg total) by mouth every 6 (six) hours as needed for muscle spasms. Patient not taking: Reported on 07/03/2019 01/10/19   Raylene Everts, MD  umeclidinium bromide [facility]  ELLIPTA) 62.5 MCG/INH AEPB Inhale 1 puff into the lungs daily. Patient not taking: Reported on 07/03/2019 04/14/18   Hoy Register, MD  furosemide (LASIX) 20 MG tablet Take 1 tablet (20 mg total) by mouth daily for 7 days. Patient not taking: Reported on 01/10/2019 04/14/18 01/10/19  Anders Simmonds, PA-C  metFORMIN (GLUCOPHAGE) 500 MG tablet Take 1 tablet (500 mg total) by mouth daily with breakfast. Patient not taking: Reported on 01/10/2019 04/14/18 01/10/19  Anders Simmonds, PA-C     Physical Exam: Vitals:   07/31/19 0436 07/31/19 0445 07/31/19 0500 07/31/19 0530  BP:  (!) 141/94 (!) 154/102 130/90  Pulse:  92 (!) 103 87  Resp:  (!) 44 (!) 28 (!) 24  Temp:      TempSrc:      SpO2:  92% 95% 91%  Weight: 68 kg     Height: 5\' 3"  (1.6 m)         Constitutional: NAD, calm, comfortable Vitals:   07/31/19 0436 07/31/19 0445 07/31/19 0500 07/31/19 0530  BP:  (!) 141/94 (!) 154/102 130/90  Pulse:  92 (!) 103 87  Resp:  (!) 44 (!) 28 (!) 24  Temp:      TempSrc:      SpO2:  92% 95% 91%  Weight: 68 kg     Height: 5\' 3"  (1.6 m)      Eyes: PERRL, lids and conjunctivae normal ENMT: Mucous membranes are moist. Posterior pharynx clear of any exudate or lesions.Normal dentition.  Neck: normal, supple, no masses, no thyromegaly Respiratory: clear to auscultation bilaterally except diffuse wheezing, no crackles. Normal respiratory effort. No accessory muscle use.  Cardiovascular: Regular rate and rhythm, no murmurs / rubs / gallops. No extremity edema. 2+ pedal pulses. No carotid bruits.  Abdomen: no tenderness, no masses palpated. No hepatosplenomegaly. Bowel sounds positive.  Musculoskeletal: no clubbing / cyanosis. No joint deformity upper and lower extremities. Good ROM, no contractures. Normal muscle tone.  Skin: no rashes, lesions, ulcers. No induration Neurologic: CN 2-12 grossly intact. Sensation intact, DTR normal. Strength 5/5 in all 4.  Psychiatric: Normal judgment and insight. Alert and oriented x 3. Normal mood.    Labs on Admission: I have personally reviewed following labs and imaging studies  CBC: Recent Labs  Lab 07/30/19 2333  WBC 6.8  HGB 14.8  HCT 44.3  MCV 96.5  PLT 197   Basic Metabolic Panel: Recent Labs  Lab 07/30/19 2333  NA 142  K 3.5  CL 104  CO2 27  GLUCOSE 117*  BUN <5*  CREATININE 0.80  CALCIUM 9.2   GFR: Estimated Creatinine Clearance: 70 mL/min (by C-G formula based on SCr of 0.8 mg/dL). Liver Function Tests: No  results for input(s): AST, ALT, ALKPHOS, BILITOT, PROT, ALBUMIN in the last 168 hours. No results for input(s): LIPASE, AMYLASE in the last 168 hours. No results for input(s): AMMONIA in the last 168 hours. Coagulation Profile: No results for input(s): INR, PROTIME in the last 168 hours. Cardiac Enzymes: No results for input(s): CKTOTAL, CKMB, CKMBINDEX, TROPONINI in the last 168 hours. BNP (last 3 results) No results for input(s): PROBNP in the last 8760 hours. HbA1C: No results for input(s): HGBA1C in the last 72 hours. CBG: No results for input(s): GLUCAP in the last 168 hours. Lipid Profile: No results for input(s): CHOL, HDL, LDLCALC, TRIG, CHOLHDL, LDLDIRECT in the last 72 hours. Thyroid Function Tests: No results for input(s): TSH, T4TOTAL, FREET4, T3FREE, THYROIDAB in the last 72 hours.  Anemia Panel: No results for input(s): VITAMINB12, FOLATE, FERRITIN, TIBC, IRON, RETICCTPCT in the last 72 hours. Urine analysis:    Component Value Date/Time   COLORURINE YELLOW 07/03/2019 1252   APPEARANCEUR CLEAR 07/03/2019 1252   LABSPEC 1.026 07/03/2019 1252   PHURINE 5.0 07/03/2019 1252   GLUCOSEU NEGATIVE 07/03/2019 1252   HGBUR NEGATIVE 07/03/2019 1252   BILIRUBINUR NEGATIVE 07/03/2019 1252   KETONESUR NEGATIVE 07/03/2019 1252   PROTEINUR NEGATIVE 07/03/2019 1252   UROBILINOGEN 0.2 05/22/2019 0922   NITRITE NEGATIVE 07/03/2019 1252   LEUKOCYTESUR NEGATIVE 07/03/2019 1252   Sepsis Labs: !!!!!!!!!!!!!!!!!!!!!!!!!!!!!!!!!!!!!!!!!!!! @LABRCNTIP (procalcitonin:4,lacticidven:4) )No results found for this or any previous visit (from the past 240 hour(s)).   Radiological Exams on Admission: DG Chest 2 View  Result Date: 07/31/2019 CLINICAL DATA:  COPD. EXAM: CHEST - 2 VIEW COMPARISON:  07/03/2019 FINDINGS: The heart size is stable. Aortic calcifications are noted. There is stable atelectasis versus scarring at the left lung base. The lungs are somewhat hyperexpanded. There is no  pneumothorax or large focal infiltrate. No pleural effusion. No acute osseous abnormality. IMPRESSION: No acute cardiopulmonary disease. Stable atelectasis versus scarring at the left lung base. Hyperexpanded lungs which can be seen in patients with COPD. Electronically Signed   By: 08/31/2019 M.D.   On: 07/31/2019 00:48   Old chart reviewed Case discussed with EDP Chest x-ray no edema or infiltrate  Assessment/Plan 59 year old female with acute COPD exacerbation causing acute respiratory hypoxic failure Principal Problem:   COPD exacerbation (HCC)-placed on frequent nebs.  Covid screen is pending.  Solu-Medrol provided.  Also placed on azithromycin.  Supplemental oxygen and wean as tolerates.  Active Problems:   Acute respiratory failure (HCC)-with hypoxia 84% with ambulation on room air.  This is a new oxygen requirement for patient.  Treat COPD as above.    Tobacco abuse-we will need continued encouragement and education on quitting smoking      DVT prophylaxis: SCDs Code Status: Full Family Communication: None Disposition Plan: 1 to 3 days Consults called: None Admission status: Admission   Areyana Leoni A MD [facility]  If 7PM-7AM, please contact night-coverage [email or web address] Password [facility]  07/31/2019, 5:48 AM

## 2019-08-01 DIAGNOSIS — Z720 Tobacco use: Secondary | ICD-10-CM

## 2019-08-01 DIAGNOSIS — J9601 Acute respiratory failure with hypoxia: Secondary | ICD-10-CM

## 2019-08-01 LAB — COMPREHENSIVE METABOLIC PANEL
ALT: 36 U/L (ref 0–44)
AST: 33 U/L (ref 15–41)
Albumin: 3.4 g/dL — ABNORMAL LOW (ref 3.5–5.0)
Alkaline Phosphatase: 88 U/L (ref 38–126)
Anion gap: 9 (ref 5–15)
BUN: 9 mg/dL (ref 6–20)
CO2: 28 mmol/L (ref 22–32)
Calcium: 9.1 mg/dL (ref 8.9–10.3)
Chloride: 102 mmol/L (ref 98–111)
Creatinine, Ser: 0.72 mg/dL (ref 0.44–1.00)
GFR calc Af Amer: >60 mL/min (ref >60)
GFR calc non Af Amer: >60 mL/min (ref >60)
Glucose, Bld: 189 mg/dL — ABNORMAL HIGH (ref 70–99)
Potassium: 3.8 mmol/L (ref 3.5–5.1)
Sodium: 139 mmol/L (ref 135–145)
Total Bilirubin: 0.6 mg/dL (ref 0.3–1.2)
Total Protein: 7 g/dL (ref 6.5–8.1)

## 2019-08-01 LAB — CBC WITH DIFFERENTIAL/PLATELET
Abs Immature Granulocytes: 0.08 10*3/uL — ABNORMAL HIGH (ref 0.00–0.07)
Basophils Absolute: 0 10*3/uL (ref 0.0–0.1)
Basophils Relative: 0 %
Eosinophils Absolute: 0 10*3/uL (ref 0.0–0.5)
Eosinophils Relative: 0 %
HCT: 43.9 % (ref 36.0–46.0)
Hemoglobin: 14.7 g/dL (ref 12.0–15.0)
Immature Granulocytes: 1 %
Lymphocytes Relative: 13 %
Lymphs Abs: 1.8 10*3/uL (ref 0.7–4.0)
MCH: 32 pg (ref 26.0–34.0)
MCHC: 33.5 g/dL (ref 30.0–36.0)
MCV: 95.4 fL (ref 80.0–100.0)
Monocytes Absolute: 1 10*3/uL (ref 0.1–1.0)
Monocytes Relative: 7 %
Neutro Abs: 10.8 10*3/uL — ABNORMAL HIGH (ref 1.7–7.7)
Neutrophils Relative %: 79 %
Platelets: 220 10*3/uL (ref 150–400)
RBC: 4.6 MIL/uL (ref 3.87–5.11)
RDW: 12.8 % (ref 11.5–15.5)
WBC: 13.6 10*3/uL — ABNORMAL HIGH (ref 4.0–10.5)
nRBC: 0 % (ref 0.0–0.2)

## 2019-08-01 LAB — LEGIONELLA PNEUMOPHILA SEROGP 1 UR AG: L. pneumophila Serogp 1 Ur Ag: NEGATIVE

## 2019-08-01 LAB — MAGNESIUM: Magnesium: 2.3 mg/dL (ref 1.7–2.4)

## 2019-08-01 LAB — PHOSPHORUS: Phosphorus: 2.2 mg/dL — ABNORMAL LOW (ref 2.5–4.6)

## 2019-08-01 MED ORDER — ACETAMINOPHEN 325 MG PO TABS: 650.0000 mg | ORAL_TABLET | Freq: Four times a day (QID) | ORAL | Status: DC | PRN

## 2019-08-01 MED ORDER — GUAIFENESIN-CODEINE 100-10 MG/5ML PO SOLN
10.0000 mL | Freq: Every evening | ORAL | Status: DC | PRN
  Administered 2019-08-01 – 2019-08-02 (×3): 10 mL via ORAL
  Filled 2019-08-01 (×4): qty 10

## 2019-08-01 MED ORDER — SODIUM CHLORIDE 0.9 % IV SOLN
1.0000 g | INTRAVENOUS | Status: DC
  Administered 2019-08-01 – 2019-08-02 (×2): 1 g via INTRAVENOUS
  Filled 2019-08-01 (×2): qty 1

## 2019-08-01 NOTE — Progress Notes (Addendum)
PROGRESS NOTE    Marissa Owens  XTK:[id number] DOB: 01-04-1961 DOA: 07/30/2019 PCP: Cain Saupe, MD     Brief Narrative:  Marissa Owens is a 59 y.o. BF PMHx HTN, nephrolithiasis, DM type II,, COPD, tobacco abuse   Comes in with several days of worsening shortness of breath and wheezing at home not relieved with her home albuterol inhaler.  She denies any fevers.  She denies nausea vomiting or diarrhea.  She is trying to quit smoking.  She does not require supplemental oxygen at home.  Patient ambulated dropped down to 84% on room air with ambulation therefore referred for admission for COPD exacerbation causing hypoxia.  She is feeling some better but not back to normal.    Subjective: 3/9 A/O x4, positive S OB, positive DOE, positive nonproductive cough.  Negative CP   Assessment & Plan:   Principal Problem:   COPD exacerbation (HCC) Active Problems:   Acute respiratory failure (HCC)   Tobacco abuse  Acute respiratory failure with hypoxia COVID-19 Labs  No results for input(s): DDIMER, FERRITIN, LDH, CRP in the last 72 hours.  Lab Results  Component Value Date   SARSCOV2NAA NEGATIVE 07/31/2019   COPD exacerbation -Solu-Medrol 80 mg BID -DuoNeb QID -Flutter valve -Incentive spirometer -Sputum culture pending -Strep pneumo/Legionella urine antigen pending -Respiratory virus panel pending -Antibiotic x7 days -Titrate O2 to maintain SPO2> 88%  Essential HTN -Currently BP control without medication monitor closely   Tobacco abuse -Patient interested in tobacco cessation -Nicotine patch   Tobacco abuse-we will need continued encouragement and education on quitting smoking   DVT prophylaxis: Avonex per pharmacy Code Status: Full Family Communication: 3/9 family at bedside discussed plan of care, answered all questions Disposition Plan:    Consultants:    Procedures/Significant Events:    I have personally reviewed and interpreted all radiology  studies and my findings are as above.  VENTILATOR SETTINGS:    Cultures 3/8 SARS coronavirus negative 3/8 sputum pending 3/8 respiratory virus panel pending 3/8 strep pneumo urine antigen negative 3/8 Legionella urine antigen negative    Antimicrobials: Anti-infectives (From admission, onward)   Start     Dose/Rate Stop   08/01/19 1700  cefTRIAXone (ROCEPHIN) 1 g in sodium chloride 0.9 % 100 mL IVPB     1 g 200 mL/hr over 30 Minutes     08/01/19 0545  azithromycin (ZITHROMAX) tablet 500 mg     500 mg 08/05/19 0959   07/31/19 0530  azithromycin (ZITHROMAX) 500 mg in sodium chloride 0.9 % 250 mL IVPB     500 mg 250 mL/hr over 60 Minutes 07/31/19 0923   07/31/19 0415  azithromycin (ZITHROMAX) tablet 500 mg     500 mg 07/31/19 0428       Devices    LINES / TUBES:      Continuous Infusions: . sodium chloride       Objective: Vitals:   07/31/19 2009 08/01/19 0400 08/01/19 0740 08/01/19 0852  BP: 128/79 119/86 (!) 140/96   Pulse: 88 93 79   Resp: 16 18 20    Temp: 98.8 F (37.1 C) 98.3 F (36.8 C) 98.6 F (37 C)   TempSrc: Oral Oral Oral   SpO2: 94% 99% 97% 92%  Weight:      Height:        Intake/Output Summary (Last 24 hours) at 08/01/2019 0901 Last data filed at 08/01/2019 0400 Gross per 24 hour  Intake 615 ml  Output 500 ml  Net 115 ml  Filed Weights   07/31/19 0436  Weight: 68 kg   Physical Exam:  General: A/O x4, positive acute respiratory distress Eyes: negative scleral hemorrhage, negative anisocoria, negative icterus ENT: Negative Runny nose, negative gingival bleeding, Neck:  Negative scars, masses, torticollis, lymphadenopathy, JVD Lungs: Decreased breath sounds bilaterally, positive expiratory wheezes, positive nonproductive cough with talking and deep inspiration Cardiovascular: Regular rate and rhythm without murmur gallop or rub normal S1 and S2 Abdomen: negative abdominal pain, nondistended, positive soft, bowel sounds, no rebound,  no ascites, no appreciable mass Extremities: No significant cyanosis, clubbing, or edema bilateral lower extremities Skin: Negative rashes, lesions, ulcers Psychiatric:  Negative depression, negative anxiety, negative fatigue, negative mania  Central nervous system:  Cranial nerves II through XII intact, tongue/uvula midline, all extremities muscle strength 5/5, sensation intact throughout, negative dysarthria, negative expressive aphasia, negative receptive aphasia. .     Data Reviewed: Care during the described time interval was provided by me .  I have reviewed this patient's available data, including medical history, events of note, physical examination, and all test results as part of my evaluation.  CBC: Recent Labs  Lab 07/30/19 2333 07/31/19 0901 08/01/19 0118  WBC 6.8 4.8 13.6*  NEUTROABS  --  3.9 10.8*  HGB 14.8 15.0 14.7  HCT 44.3 46.2* 43.9  MCV 96.5 97.7 95.4  PLT 197 197 220   Basic Metabolic Panel: Recent Labs  Lab 07/30/19 2333 08/01/19 0118  NA 142 139  K 3.5 3.8  CL 104 102  CO2 27 28  GLUCOSE 117* 189*  BUN <5* 9  CREATININE 0.80 0.72  CALCIUM 9.2 9.1  MG  --  2.3  PHOS  --  2.2*   GFR: Estimated Creatinine Clearance: 70 mL/min (by C-G formula based on SCr of 0.72 mg/dL). Liver Function Tests: Recent Labs  Lab 08/01/19 0118  AST 33  ALT 36  ALKPHOS 88  BILITOT 0.6  PROT 7.0  ALBUMIN 3.4*   No results for input(s): LIPASE, AMYLASE in the last 168 hours. No results for input(s): AMMONIA in the last 168 hours. Coagulation Profile: No results for input(s): INR, PROTIME in the last 168 hours. Cardiac Enzymes: No results for input(s): CKTOTAL, CKMB, CKMBINDEX, TROPONINI in the last 168 hours. BNP (last 3 results) No results for input(s): PROBNP in the last 8760 hours. HbA1C: Recent Labs    07/31/19 0901  HGBA1C 6.0*   CBG: No results for input(s): GLUCAP in the last 168 hours. Lipid Profile: Recent Labs    07/31/19 0901  CHOL 181    HDL 38*  LDLCALC 125*  TRIG 92  CHOLHDL 4.8   Thyroid Function Tests: No results for input(s): TSH, T4TOTAL, FREET4, T3FREE, THYROIDAB in the last 72 hours. Anemia Panel: No results for input(s): VITAMINB12, FOLATE, FERRITIN, TIBC, IRON, RETICCTPCT in the last 72 hours. Sepsis Labs: No results for input(s): PROCALCITON, LATICACIDVEN in the last 168 hours.  Recent Results (from the past 240 hour(s))  SARS CORONAVIRUS 2 (TAT 6-24 HRS) Nasopharyngeal Nasopharyngeal Swab     Status: None   Collection Time: 07/31/19  4:12 AM   Specimen: Nasopharyngeal Swab  Result Value Ref Range Status   SARS Coronavirus 2 NEGATIVE NEGATIVE Final    Comment: (NOTE) SARS-CoV-2 target nucleic acids are NOT DETECTED. The SARS-CoV-2 RNA is generally detectable in upper and lower respiratory specimens during the acute phase of infection. Negative results do not preclude SARS-CoV-2 infection, do not rule out co-infections with other pathogens, and should not be used as  the sole basis for treatment or other patient management decisions. Negative results must be combined with clinical observations, patient history, and epidemiological information. The expected result is Negative. Fact Sheet for Patients: SugarRoll.be Fact Sheet for Healthcare Providers: https://www.Augustus Zurawski-mathews.com/ This test is not yet approved or cleared by the [place] FDA and  has been authorized for detection and/or diagnosis of SARS-CoV-2 by FDA under an Emergency Use Authorization (EUA). This EUA will remain  in effect (meaning this test can be used) for the duration of the COVID-19 declaration under Section 56 4(b)(1) of the Act, 21 U.S.C. section 360bbb-3(b)(1), unless the authorization is terminated or revoked sooner. Performed at [facility], [place] [address], [place], [place] [address]          Radiology Studies: DG Chest 2 View  Result Date: 07/31/2019 CLINICAL  DATA:  COPD. EXAM: CHEST - 2 VIEW COMPARISON:  07/03/2019 FINDINGS: The heart size is stable. Aortic calcifications are noted. There is stable atelectasis versus scarring at the left lung base. The lungs are somewhat hyperexpanded. There is no pneumothorax or large focal infiltrate. No pleural effusion. No acute osseous abnormality. IMPRESSION: No acute cardiopulmonary disease. Stable atelectasis versus scarring at the left lung base. Hyperexpanded lungs which can be seen in patients with COPD. Electronically Signed   By: Constance Holster M.D.   On: 07/31/2019 00:48        Scheduled Meds: . azithromycin  500 mg Oral Daily  . enoxaparin (LOVENOX) injection  40 mg Subcutaneous Q24H  . ipratropium-albuterol  3 mL Nebulization QID  . methylPREDNISolone (SOLU-MEDROL) injection  80 mg Intravenous Q12H  . nicotine  21 mg Transdermal Daily  . sodium chloride flush  3 mL Intravenous Q12H   Continuous Infusions: . sodium chloride       LOS: 1 day    Time spent:40 min    Kemari Narez, Geraldo Docker, MD [facility] Pager [phone]  If 7PM-7AM, please contact night-coverage [email or web address] Password [email or web address] 08/01/2019, 9:01 AM

## 2019-08-01 NOTE — Plan of Care (Signed)

## 2019-08-01 NOTE — Progress Notes (Signed)
Nutrition Brief Note  Received consult from the COPD Protocol. Unable to speak with patient at this time. Phone number busy this morning. On admission, patient reported no recent weight loss and no recent poor appetite or intake.   From review of usual weights, patient has had no significant weight changes over the past 3 months.   Body mass index is 26.56 kg/m. Patient meets criteria for normal weight based on current BMI.   Current diet order is heart healthy, meal completion not recorded. Labs and medications reviewed.   No nutrition interventions warranted at this time. If nutrition issues arise, please consult RD.   Joaquin Courts, RD, LDN, CNSC Please refer to [facility] for contact information.

## 2019-08-01 NOTE — Progress Notes (Signed)
Occupational Therapy Evaluation Patient Details Name: Marissa Owens MRN: [id number] DOB: 04-20-1961 Today's Date: 08/01/2019    History of Present Illness Pt is a 59 y/o female admitted secondary to COPD exacerbation. PMH includes COPD, DM, HTN, and tobacco abuse.    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  Her brother lives with her.  Patient is typically not on O2 at home, and today she was on 2L O2 with SpO2 100 at rest.  With mobility SpO2 dropped to 94.  Patient stated the only difference she has noticed is she feels more short of breath than normal.  Patient able to complete mobility with min guard and standing ADLs with supervision.  Will continue to follow with OT acutely to address activity tolerance and energy conservation to increase independence.        Follow Up Recommendations  No OT follow up    Equipment Recommendations  3 in 1 bedside commode    Recommendations for Other Services       Precautions / Restrictions Restrictions Weight Bearing Restrictions: No      Mobility Bed Mobility Overal bed mobility: Modified Independent                Transfers Overall transfer level: Needs assistance Equipment used: None Transfers: Sit to/from Stand Sit to Stand: Min guard              Balance Overall balance assessment: Needs assistance Sitting-balance support: No upper extremity supported;Feet supported Sitting balance-Leahy Scale: Good     Standing balance support: No upper extremity supported;During functional activity Standing balance-Leahy Scale: Fair Standing balance comment: Walks carefully with small steps                           ADL either performed or assessed with clinical judgement   ADL Overall ADL's : Needs assistance/impaired Eating/Feeding: Independent;Sitting   Grooming: Supervision/safety;Standing   Upper Body Bathing: Supervision/ safety;Standing   Lower Body  Bathing: Sit to/from stand;Min guard   Upper Body Dressing : Supervision/safety;Standing   Lower Body Dressing: Min guard;Sit to/from stand   Toilet Transfer: Film/video editor mobility during ADLs: Designer, jewellery      Pertinent Vitals/Pain Pain Assessment: No/denies pain     Hand Dominance     Extremity/Trunk Assessment Upper Extremity Assessment Upper Extremity Assessment: Defer to OT evaluation           Communication Communication Communication: No difficulties   Cognition Arousal/Alertness: Awake/alert Behavior During Therapy: WFL for tasks assessed/performed Overall Cognitive Status: Within Functional Limits for tasks assessed                                     General Comments  Patient coughing a lot during session    Exercises     Shoulder Instructions      Home Living Family/patient expects to be discharged to:: Private residence Living Arrangements: Other relatives Available Help at Discharge: Family;Available 24 hours/day Type of Home: House Home Access: Level entry     Home Layout: One level     Bathroom Shower/Tub: Chief Strategy Officer: Standard  Home Equipment: None          Prior Functioning/Environment Level of Independence: Independent        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 List: Decreased activity tolerance;Impaired balance (sitting and/or standing);Cardiopulmonary status limiting activity      OT Treatment/Interventions: Therapeutic exercise;Self-care/ADL training;Energy conservation;Therapeutic activities;Patient/family education;Balance training    OT Goals(Current goals can be found in the care plan section) Acute Rehab OT Goals Patient Stated Goal: to go back to work OT Goal Formulation: With patient Time For Goal Achievement: 08/15/19 Potential to Achieve Goals: Good  OT Frequency: Min  2X/week   Barriers to D/C:            Co-evaluation              AM-PAC OT "6 Clicks" Daily Activity     Outcome Measure Help from another person eating meals?: None Help from another person taking care of personal grooming?: A Little Help from another person toileting, which includes using toliet, bedpan, or urinal?: A Little Help from another person bathing (including washing, rinsing, drying)?: A Little Help from another person to put on and taking off regular upper body clothing?: A Little Help from another person to put on and taking off regular lower body clothing?: A Little 6 Click Score: 19   End of Session Equipment Utilized During Treatment: Oxygen Nurse Communication: Mobility status  Activity Tolerance: Patient tolerated treatment well Patient left: in bed;with call bell/phone within reach  OT Visit Diagnosis: Unsteadiness on feet (R26.81);Other (comment)(Cardiopulminary limitations)                Time: 1535-1550 OT Time Calculation (min): 15 min Charges:  OT General Charges $OT Visit: 1 Visit OT Evaluation $OT Eval Moderate Complexity: 1 [address] OTR/L   Phylliss Bob 08/01/2019, 4:01 PM

## 2019-08-01 NOTE — Progress Notes (Signed)
Physical Therapy Treatment Patient Details Name: Marissa Owens MRN: [id number] DOB: [birth year] Today's Date: 08/01/2019    History of Present Illness Pt is a 59 y/o female admitted secondary to COPD exacerbation. PMH includes COPD, DM, HTN, and tobacco abuse.     PT Comments    Pt agreeable to walking with therapy. Pt is limited in safe mobility by increased WOB, and increased oxygen demand, as well as self limiting due to anxiety that walking will cause painful coughing. Pt is mod I for bed mobility, and supervision for transfers and ambulation without AD. Pt educated on need for finding balance between rest and activity particularly with small bouts of ambulation to build endurance. 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 Although pt will not likely need additional PT at discharge, PT will continue to follow acutely for progressing mobility and weaning supplemental O2.    Follow Up Recommendations  No PT follow up     Equipment Recommendations  None recommended by PT    Recommendations for Other Services       Precautions / Restrictions Precautions Precautions: Other (comment) Precaution Comments: Watch O2 sats Restrictions Weight Bearing Restrictions: No    Mobility  Bed Mobility Overal bed mobility: Modified Independent                Transfers Overall transfer level: Needs assistance Equipment used: None Transfers: Sit to/from Stand Sit to Stand: Supervision         General transfer comment: supervision for safety   Ambulation/Gait Ambulation/Gait assistance: Supervision Gait Distance (Feet): 40 Feet(3x40 ) Assistive device: None Gait Pattern/deviations: Step-through pattern;Decreased stride length Gait velocity: Decreased Gait velocity interpretation: <1.8 ft/sec, indicate of risk for recurrent falls General Gait Details: continues to have slow, cautious gait, increased anxiety about ambulation causing coughing,           Balance Overall balance assessment: Needs assistance Sitting-balance support: No upper extremity supported;Feet supported Sitting balance-Leahy Scale: Good     Standing balance support: No upper extremity supported;During functional activity Standing balance-Leahy Scale: Fair Standing balance comment: Walks carefully with small steps                            Cognition Arousal/Alertness: Awake/alert Behavior During Therapy: WFL for tasks assessed/performed Overall Cognitive Status: Within Functional Limits for tasks assessed                                           General Comments General comments (skin integrity, edema, etc.): Pt on 2L O2 via [place] and able to maintain SaO2 >95%O2 with ambulation      Pertinent Vitals/Pain Pain Assessment: Faces Faces Pain Scale: Hurts little more Pain Location: chest and head with coughing Pain Descriptors / Indicators: Aching;Throbbing Pain Intervention(s): Limited activity within patient's tolerance;Monitored during session;Repositioned    Home Living Family/patient expects to be discharged to:: Private residence Living Arrangements: Other relatives Available Help at Discharge: Family;Available 24 hours/day Type of Home: House Home Access: Level entry   Home Layout: One level Home Equipment: None      Prior Function Level of Independence: Independent      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) Acute Rehab PT Goals Patient Stated Goal: to  go back to work PT Goal Formulation: With patient Time For Goal Achievement: 08/14/19 Potential to Achieve Goals: Good Progress towards PT goals: Progressing toward goals    Frequency    Min 3X/week      PT Plan Current plan remains appropriate       AM-PAC PT "6 Clicks" Mobility   Outcome Measure  Help needed turning from your back to your side while in a flat bed without using bedrails?:  None Help needed moving from lying on your back to sitting on the side of a flat bed without using bedrails?: None Help needed moving to and from a bed to a chair (including a wheelchair)?: A Little Help needed standing up from a chair using your arms (e.g., wheelchair or bedside chair)?: A Little Help needed to walk in hospital room?: A Little Help needed climbing 3-5 steps with a railing? : A Lot 6 Click Score: 19    End of Session Equipment Utilized During Treatment: Oxygen Activity Tolerance: Treatment limited secondary to medical complications (Comment)(wheezing, SOB) Patient left: in bed;with call bell/phone within reach;with family/visitor present Nurse Communication: Mobility status;Other (comment)(pt requests purewick due to incontinence with cough ) PT Visit Diagnosis: Other abnormalities of gait and mobility (R26.89)     Time: 5885-0277 PT Time Calculation (min) (ACUTE ONLY): 18 min  Charges:  $Gait Training: 8-22 mins                     Jaritza Duignan B. Migdalia Dk PT, DPT Acute Rehabilitation Services Pager [phone] Office [phone]    [place] 08/01/2019, 5:10 PM

## 2019-08-02 ENCOUNTER — Inpatient Hospital Stay (HOSPITAL_COMMUNITY): Payer: Managed Care, Other (non HMO)

## 2019-08-02 LAB — CBC WITH DIFFERENTIAL/PLATELET
Abs Immature Granulocytes: 0.22 10*3/uL — ABNORMAL HIGH (ref 0.00–0.07)
Basophils Absolute: 0 10*3/uL (ref 0.0–0.1)
Basophils Relative: 0 %
Eosinophils Absolute: 0 10*3/uL (ref 0.0–0.5)
Eosinophils Relative: 0 %
HCT: 45 % (ref 36.0–46.0)
Hemoglobin: 14.9 g/dL (ref 12.0–15.0)
Immature Granulocytes: 2 %
Lymphocytes Relative: 9 %
Lymphs Abs: 1.3 10*3/uL (ref 0.7–4.0)
MCH: 31.8 pg (ref 26.0–34.0)
MCHC: 33.1 g/dL (ref 30.0–36.0)
MCV: 95.9 fL (ref 80.0–100.0)
Monocytes Absolute: 0.3 10*3/uL (ref 0.1–1.0)
Monocytes Relative: 2 %
Neutro Abs: 12.3 10*3/uL — ABNORMAL HIGH (ref 1.7–7.7)
Neutrophils Relative %: 87 %
Platelets: 234 10*3/uL (ref 150–400)
RBC: 4.69 MIL/uL (ref 3.87–5.11)
RDW: 12.7 % (ref 11.5–15.5)
WBC: 14.2 10*3/uL — ABNORMAL HIGH (ref 4.0–10.5)
nRBC: 0 % (ref 0.0–0.2)

## 2019-08-02 LAB — COMPREHENSIVE METABOLIC PANEL
ALT: 30 U/L (ref 0–44)
AST: 25 U/L (ref 15–41)
Albumin: 3.6 g/dL (ref 3.5–5.0)
Alkaline Phosphatase: 103 U/L (ref 38–126)
Anion gap: 9 (ref 5–15)
BUN: 11 mg/dL (ref 6–20)
CO2: 30 mmol/L (ref 22–32)
Calcium: 9.1 mg/dL (ref 8.9–10.3)
Chloride: 100 mmol/L (ref 98–111)
Creatinine, Ser: 0.74 mg/dL (ref 0.44–1.00)
GFR calc Af Amer: >60 mL/min (ref >60)
GFR calc non Af Amer: >60 mL/min (ref >60)
Glucose, Bld: 223 mg/dL — ABNORMAL HIGH (ref 70–99)
Potassium: 3.6 mmol/L (ref 3.5–5.1)
Sodium: 139 mmol/L (ref 135–145)
Total Bilirubin: 0.5 mg/dL (ref 0.3–1.2)
Total Protein: 7.2 g/dL (ref 6.5–8.1)

## 2019-08-02 LAB — PHOSPHORUS: Phosphorus: 2.3 mg/dL — ABNORMAL LOW (ref 2.5–4.6)

## 2019-08-02 LAB — MAGNESIUM: Magnesium: 2.4 mg/dL (ref 1.7–2.4)

## 2019-08-02 MED ORDER — IPRATROPIUM-ALBUTEROL 0.5-2.5 (3) MG/3ML IN SOLN
3.0000 mL | RESPIRATORY_TRACT | Status: DC
  Administered 2019-08-02 – 2019-08-03 (×6): 3 mL via RESPIRATORY_TRACT
  Filled 2019-08-02 (×6): qty 3

## 2019-08-02 MED ORDER — K PHOS MONO-SOD PHOS DI & MONO 155-852-130 MG PO TABS
500.0000 mg | ORAL_TABLET | Freq: Two times a day (BID) | ORAL | Status: AC
  Administered 2019-08-02 (×2): 500 mg via ORAL
  Filled 2019-08-02 (×2): qty 2

## 2019-08-02 MED ORDER — GUAIFENESIN ER 600 MG PO TB12
600.0000 mg | ORAL_TABLET | Freq: Two times a day (BID) | ORAL | Status: DC
  Administered 2019-08-02 – 2019-08-03 (×3): 600 mg via ORAL
  Filled 2019-08-02 (×3): qty 1

## 2019-08-02 NOTE — Progress Notes (Signed)
Ambulated approx. 100 ft In the hallway on room air, with further distance she became very wheezy while the lowest her O2 dropped was to 89. Placed back on 2 L in room for comoft and given a neb

## 2019-08-02 NOTE — Progress Notes (Signed)
PROGRESS NOTE    Marissa Owens  TIW:[id number] DOB: 08/21/1960 DOA: 07/30/2019 PCP: Antony Blackbird, MD   Brief Narrative:  HPI per Dr. Derrill Kay on 07/31/19 Marissa Owens is a 59 y.o. female with medical history significant of COPD, tobacco abuse comes in with several days of worsening shortness of breath and wheezing at home not relieved with her home albuterol inhaler.  She denies any fevers.  She denies nausea vomiting or diarrhea.  She is trying to quit smoking.  She does not require supplemental oxygen at home.  Patient ambulated dropped down to 84% on room air with ambulation therefore referred for admission for COPD exacerbation causing hypoxia.  She is feeling some better but not back to normal.  **Interim History  Respiratory status is slowly improving however she is currently now back to baseline.  Was off of oxygen was started this morning.  But desaturated and had to be placed back on 2 L  Assessment & Plan:   Principal Problem:   COPD exacerbation ([place]) Active Problems:   Acute respiratory failure ([facility])   Tobacco abuse  Acute Respiratory Failure with Hypoxia -In the setting of COPD exacerbation as below -Patient's SARS-CoV-2 testing was negative -To new supplemental oxygen via nasal cannula and wean O2 as tolerated -Continuous pulse oximetry and maintain O2 saturations greater than 90% -Repeat chest x-ray in the a.m. -SpO2: 92 % O2 Flow Rate (L/min): 2 L/min -We will need an ambulatory home O2 screen prior to discharge -Treatment as below  Acute Exacerbation of COPD -Currently receiving duo nebs scheduled every 4 hours as well as DuoNeb every 4 hours as needed for wheezing -Continue with Solu-Medrol 80 mg every 12 -Also continue with supplemental oxygen via nasal cannula -Continue with guaifenesin 600 mg p.o. twice daily -Encourage flutter valve and incentive spirometry -Continue with azithromycin 500 g p.o. daily for 4 doses for pulmonary inflammation -He  also receiving ceftriaxone 1 g every 24 hours for suspected CAP but may likely stop  -SARS-CoV-2 testing was negative -Strep pneumo and Legionella urine antigen with was negative -Repeat Chest x-ray showed "Heart is normal size. Decreasing lung volumes with increasing bibasilar atelectasis. No effusions. No acute bony abnormality" -Respiratory virus panel was never done but will order now -Tinea with Mucinex 600 mg p.o. twice daily as well as guaifenesin-codeine 10 mL p.o. every hour as needed for cough -Continue smoking cessation counseling efforts  Hypertension -Currently not on any antiantihypertensives  -Will need to continue monitor carefully  Tobacco Abuse -Smoking cessation counseling given as she has been a longtime smoker -Continue Nicotine Patch 21 mg transdermally every 24 hours -Continue to encourage and educate on quitting smoking.  Leukocytosis -Likely worsened in the setting of steroid demargination -Patient WBC went from 6.8 is now 14.2 -Continue to monitor for signs and symptoms of infection we will stop her IV ceftriaxone -Repeat CBC in a.m.  Hyperglycemia in the setting of Prediabetes -Worsened in the setting of her steroid demargination -Patient's blood sugars on daily BMP/CMP's have been ranging from 117 - 223 -Hemoglobin A1c was 6.0 -Continue to monitor blood sugars carefully and if necessary will place on sensitive NovoLog/scale AC  Hypophosphatemia -Patient's phosphorus level this morning was 2.3 -Replete with p.o. K-Phos Neutral 500 mg p.o. twice daily x2 doses -continue to monitor and replete as necessary -Repeat phosphorus level in a.m.  DVT prophylaxis: Enoxaparin 40 mg subcu every 24h  Code Status: FULL CODE  Family Communication: No family present at bedside  Disposition  Plan: Patient is from home and has an acute COPD exacerbation and will need to have her breathing back to baseline prior to safe discharge disposition and will need 1-2 more days  likely once her wheezing improves; if it does not improve will need pulmonary assistance and evaluation.  Consultants:   None   Procedures: None   Antimicrobials:  Anti-infectives (From admission, onward)   Start     Dose/Rate Route Frequency Ordered Stop   08/01/19 1700  cefTRIAXone (ROCEPHIN) 1 g in sodium chloride 0.9 % 100 mL IVPB     1 g 200 mL/hr over 30 Minutes Intravenous Every 24 hours 08/01/19 1539     08/01/19 0545  azithromycin (ZITHROMAX) tablet 500 mg     500 mg Oral Daily 07/31/19 0534 08/05/19 0959   07/31/19 0530  azithromycin (ZITHROMAX) 500 mg in sodium chloride 0.9 % 250 mL IVPB     500 mg 250 mL/hr over 60 Minutes Intravenous Every 24 hours 07/31/19 0534 07/31/19 0923   07/31/19 0415  azithromycin (ZITHROMAX) tablet 500 mg     500 mg Oral  Once 07/31/19 4008 07/31/19 0428     Subjective: Patient was seen and examined at bedside and she still stating that she had some shortness of breath and states is very painful for her to take deep breaths and especially doing her incentive spirometer.  No nausea or vomiting.  Still has some wheezing.  Does not feel completely back to baseline.  No other concerns or complaints at this time.  Objective: Vitals:   08/02/19 0744 08/02/19 0751 08/02/19 1511 08/02/19 1518  BP: (!) 141/106  (!) 137/91   Pulse: 82  81   Resp:      Temp: 98.7 F (37.1 C)  98.4 F (36.9 C)   TempSrc:      SpO2: 95% 93% 97% 92%  Weight:      Height:        Intake/Output Summary (Last 24 hours) at 08/02/2019 1847 Last data filed at 08/02/2019 1200 Gross per 24 hour  Intake 360 ml  Output 550 ml  Net -190 ml   Filed Weights   07/31/19 0436  Weight: 68 kg   Examination: Physical Exam:  Constitutional: WN/WD overweight African-American female currently in, NAD and appears mildly anxious Eyes: Lids and conjunctivae normal, sclerae anicteric  ENMT: External Ears, Nose appear normal. Grossly normal hearing. Neck: Appears normal, supple,  no cervical masses, normal ROM, no appreciable thyromegaly; no JVD Respiratory: Diminished to auscultation bilaterally with some coarse breath sounds and wheezing; No appreciable rales, rhonchi or crackles. Normal respiratory effort and patient is not tachypenic. No accessory muscle use.  Currently was not wearing any supplemental oxygen when I saw her but then ended back on oxygen later in the day. Cardiovascular: RRR, no murmurs / rubs / gallops. S1 and S2 auscultated.  Abdomen: Soft, non-tender, slightly distended secondary body habitus. Bowel sounds positive x4.  GU: Deferred. Musculoskeletal: No clubbing / cyanosis of digits/nails. No joint deformity upper and lower extremities.  Skin: No rashes, lesions, ulcers on a limited skin evaluation. No induration; Warm and dry.  Neurologic: CN 2-12 grossly intact with no focal deficits. Romberg sign and cerebellar reflexes not assessed.  Psychiatric: Normal judgment and insight. Alert and oriented x 3. Slightly anxious mood and appropriate affect.   Data Reviewed: I have personally reviewed following labs and imaging studies  CBC: Recent Labs  Lab 07/30/19 2333 07/31/19 0901 08/01/19 0118 08/02/19 0227  WBC 6.8  4.8 13.6* 14.2*  NEUTROABS  --  3.9 10.8* 12.3*  HGB 14.8 15.0 14.7 14.9  HCT 44.3 46.2* 43.9 45.0  MCV 96.5 97.7 95.4 95.9  PLT 197 197 220 234   Basic Metabolic Panel: Recent Labs  Lab 07/30/19 2333 08/01/19 0118 08/02/19 0227  NA 142 139 139  K 3.5 3.8 3.6  CL 104 102 100  CO2 27 28 30   GLUCOSE 117* 189* 223*  BUN <5* 9 11  CREATININE 0.80 0.72 0.74  CALCIUM 9.2 9.1 9.1  MG  --  2.3 2.4  PHOS  --  2.2* 2.3*   GFR: Estimated Creatinine Clearance: 70 mL/min (by C-G formula based on SCr of 0.74 mg/dL). Liver Function Tests: Recent Labs  Lab 08/01/19 0118 08/02/19 0227  AST 33 25  ALT 36 30  ALKPHOS 88 103  BILITOT 0.6 0.5  PROT 7.0 7.2  ALBUMIN 3.4* 3.6   No results for input(s): LIPASE, AMYLASE in the  last 168 hours. No results for input(s): AMMONIA in the last 168 hours. Coagulation Profile: No results for input(s): INR, PROTIME in the last 168 hours. Cardiac Enzymes: No results for input(s): CKTOTAL, CKMB, CKMBINDEX, TROPONINI in the last 168 hours. BNP (last 3 results) No results for input(s): PROBNP in the last 8760 hours. HbA1C: Recent Labs    07/31/19 0901  HGBA1C 6.0*   CBG: No results for input(s): GLUCAP in the last 168 hours. Lipid Profile: Recent Labs    07/31/19 0901  CHOL 181  HDL 38*  LDLCALC 125*  TRIG 92  CHOLHDL 4.8   Thyroid Function Tests: No results for input(s): TSH, T4TOTAL, FREET4, T3FREE, THYROIDAB in the last 72 hours. Anemia Panel: No results for input(s): VITAMINB12, FOLATE, FERRITIN, TIBC, IRON, RETICCTPCT in the last 72 hours. Sepsis Labs: No results for input(s): PROCALCITON, LATICACIDVEN in the last 168 hours.  Recent Results (from the past 240 hour(s))  SARS CORONAVIRUS 2 (TAT 6-24 HRS) Nasopharyngeal Nasopharyngeal Swab     Status: None   Collection Time: 07/31/19  4:12 AM   Specimen: Nasopharyngeal Swab  Result Value Ref Range Status   SARS Coronavirus 2 NEGATIVE NEGATIVE Final    Comment: (NOTE) SARS-CoV-2 target nucleic acids are NOT DETECTED. The SARS-CoV-2 RNA is generally detectable in upper and lower respiratory specimens during the acute phase of infection. Negative results do not preclude SARS-CoV-2 infection, do not rule out co-infections with other pathogens, and should not be used as the sole basis for treatment or other patient management decisions. Negative results must be combined with clinical observations, patient history, and epidemiological information. The expected result is Negative. Fact Sheet for Patients: 09/30/19 Fact Sheet for Healthcare Providers: HairSlick.no This test is not yet approved or cleared by the quierodirigir.com FDA and  has been  authorized for detection and/or diagnosis of SARS-CoV-2 by FDA under an Emergency Use Authorization (EUA). This EUA will remain  in effect (meaning this test can be used) for the duration of the COVID-19 declaration under Section 56 4(b)(1) of the Act, 21 U.S.C. section 360bbb-3(b)(1), unless the authorization is terminated or revoked sooner. Performed at [facility], [address] [place], [place]     RN Pressure Injury Documentation:     Estimated body mass index is 26.56 kg/m as calculated from the following:   Height as of this encounter: 5\' 3"  (1.6 m).   Weight as of this encounter: 68 kg.  Malnutrition Type:      Malnutrition Characteristics:  Nutrition Interventions:      Radiology Studies: DG CHEST PORT 1 VIEW  Result Date: 08/02/2019 CLINICAL DATA:  Shortness of breath EXAM: PORTABLE CHEST 1 VIEW COMPARISON:  07/31/2019 FINDINGS: Heart is normal size. Decreasing lung volumes with increasing bibasilar atelectasis. No effusions. No acute bony abnormality. IMPRESSION: Increasing bibasilar atelectasis. Electronically Signed   By: Charlett Nose M.D.   On: 08/02/2019 09:59   Scheduled Meds: . azithromycin  500 mg Oral Daily  . enoxaparin (LOVENOX) injection  40 mg Subcutaneous Q24H  . guaiFENesin  600 mg Oral BID  . ipratropium-albuterol  3 mL Nebulization Q4H  . methylPREDNISolone (SOLU-MEDROL) injection  80 mg Intravenous Q12H  . nicotine  21 mg Transdermal Daily  . phosphorus  500 mg Oral BID  . sodium chloride flush  3 mL Intravenous Q12H   Continuous Infusions: . sodium chloride    . cefTRIAXone (ROCEPHIN)  IV 1 g (08/02/19 1741)    LOS: 2 days   Merlene Laughter, DO [facility] PAGER is on AMION  If 7PM-7AM, please contact night-coverage [email or web address]

## 2019-08-02 NOTE — Plan of Care (Signed)

## 2019-08-02 NOTE — Progress Notes (Signed)
OT treatment   08/02/19 0900  OT Visit Information  Last OT Received On 08/02/19  Assistance Needed +1  History of Present Illness Pt is a 59 y/o female admitted secondary to COPD exacerbation. PMH includes COPD, DM, HTN, and tobacco abuse.   Precautions  Precaution Comments Watch O2 sats  Pain Assessment  Pain Assessment No/denies pain  Cognition  Arousal/Alertness Awake/alert  Behavior During Therapy WFL for tasks assessed/performed  Overall Cognitive Status Within Functional Limits for tasks assessed  Upper Extremity Assessment  Upper Extremity Assessment Generalized weakness  Lower Extremity Assessment  Lower Extremity Assessment Defer to PT evaluation  ADL  Overall ADL's  Needs assistance/impaired  Eating/Feeding Independent;Sitting  Grooming Wash/dry hands;Wash/dry face;Oral care;Independent  Upper Body Bathing Supervision/ safety;Standing  Lower Body Bathing Sit to/from stand;Min guard  Upper Body Dressing  Supervision/safety;Standing  Lower Body Dressing Supervision/safety;Set up  Toilet Transfer Supervision/safety;Ambulation  Toileting- Architect and Hygiene Supervision/safety  Tub/ Museum/gallery exhibitions officer shower;Supervision/safety  Functional mobility during ADLs Supervision/safety (Pt. S with transfer to walk in shower and commode. )  General ADL Comments Pt ed on cross leg technique for ADLs for energy conservation. Pt. ed on sitting down in shower and keeping temperture down in shower to make breathing easier. Pt. ed  on increasing I and safety with ADLs transfers.   Bed Mobility  Overal bed mobility Modified Independent  Balance  Sitting balance-Leahy Scale Good  Restrictions  Weight Bearing Restrictions No  Transfers  Transfers Stand Pivot Transfers  Sit to Stand Supervision  Stand pivot transfers Supervision  General Comments  General comments (skin integrity, edema, etc.) Pt. is currently on home 02 2l.   OT - End of Session  Equipment  Utilized During Treatment Oxygen  Activity Tolerance Patient tolerated treatment well  Patient left in chair;with call bell/phone within reach  Nurse Communication  (nurse ok therapy)  OT Assessment/Plan  OT Visit Diagnosis Other (comment) (Pt. ed on energy cvonservation)  OT Frequency (ACUTE ONLY) Min 2X/week  Follow Up Recommendations No OT follow up  OT Equipment None recommended by OT  AM-PAC OT "6 Clicks" Daily Activity Outcome Measure (Version 2)  Help from another person eating meals? 4  Help from another person taking care of personal grooming? 3  Help from another person toileting, which includes using toliet, bedpan, or urinal? 3  Help from another person bathing (including washing, rinsing, drying)? 3  Help from another person to put on and taking off regular upper body clothing? 3  Help from another person to put on and taking off regular lower body clothing? 3  6 Click Score 19  OT Goal Progression  Progress towards OT goals Progressing toward goals  Acute Rehab OT Goals  Patient Stated Goal to go home  OT Goal Formulation With patient  Time For Goal Achievement 08/15/19  Potential to Achieve Goals Good  ADL Goals  Pt Will Perform Grooming Independently;standing  Pt Will Perform Lower Body Bathing Independently;sit to/from stand  Pt/caregiver will Perform Home Exercise Program With theraband;Both right and left upper extremity  Additional ADL Goal #1 Patient will increase activity tolerance to 10 min of standing ADLs without shortness of breath.  Additional ADL Goal #2 Patient will independenlty verbalize 3 energy conservation techniques.  OT Time Calculation  OT Start Time (ACUTE ONLY) 0925  OT Stop Time (ACUTE ONLY) 0948  OT Time Calculation (min) 23 min  OT General Charges  $OT Visit 1 Visit  OT Treatments  $Self Care/Home Management  23-37  mins  Reece Packer OT/L

## 2019-08-03 LAB — CBC WITH DIFFERENTIAL/PLATELET
Abs Immature Granulocytes: 0.15 10*3/uL — ABNORMAL HIGH (ref 0.00–0.07)
Basophils Absolute: 0 10*3/uL (ref 0.0–0.1)
Basophils Relative: 0 %
Eosinophils Absolute: 0 10*3/uL (ref 0.0–0.5)
Eosinophils Relative: 0 %
HCT: 43.5 % (ref 36.0–46.0)
Hemoglobin: 14.2 g/dL (ref 12.0–15.0)
Immature Granulocytes: 1 %
Lymphocytes Relative: 11 %
Lymphs Abs: 1.3 10*3/uL (ref 0.7–4.0)
MCH: 31.6 pg (ref 26.0–34.0)
MCHC: 32.6 g/dL (ref 30.0–36.0)
MCV: 96.9 fL (ref 80.0–100.0)
Monocytes Absolute: 0.6 10*3/uL (ref 0.1–1.0)
Monocytes Relative: 5 %
Neutro Abs: 10.3 10*3/uL — ABNORMAL HIGH (ref 1.7–7.7)
Neutrophils Relative %: 83 %
Platelets: 204 10*3/uL (ref 150–400)
RBC: 4.49 MIL/uL (ref 3.87–5.11)
RDW: 12.5 % (ref 11.5–15.5)
WBC: 12.3 10*3/uL — ABNORMAL HIGH (ref 4.0–10.5)
nRBC: 0 % (ref 0.0–0.2)

## 2019-08-03 LAB — MAGNESIUM: Magnesium: 2.3 mg/dL (ref 1.7–2.4)

## 2019-08-03 LAB — COMPREHENSIVE METABOLIC PANEL
ALT: 23 U/L (ref 0–44)
AST: 18 U/L (ref 15–41)
Albumin: 3.2 g/dL — ABNORMAL LOW (ref 3.5–5.0)
Alkaline Phosphatase: 85 U/L (ref 38–126)
Anion gap: 12 (ref 5–15)
BUN: 16 mg/dL (ref 6–20)
CO2: 28 mmol/L (ref 22–32)
Calcium: 9 mg/dL (ref 8.9–10.3)
Chloride: 98 mmol/L (ref 98–111)
Creatinine, Ser: 0.79 mg/dL (ref 0.44–1.00)
GFR calc Af Amer: >60 mL/min (ref >60)
GFR calc non Af Amer: >60 mL/min (ref >60)
Glucose, Bld: 282 mg/dL — ABNORMAL HIGH (ref 70–99)
Potassium: 3.4 mmol/L — ABNORMAL LOW (ref 3.5–5.1)
Sodium: 138 mmol/L (ref 135–145)
Total Bilirubin: 0.2 mg/dL — ABNORMAL LOW (ref 0.3–1.2)
Total Protein: 6.4 g/dL — ABNORMAL LOW (ref 6.5–8.1)

## 2019-08-03 LAB — PHOSPHORUS: Phosphorus: 3.1 mg/dL (ref 2.5–4.6)

## 2019-08-03 MED ORDER — NICOTINE 21 MG/24HR TD PT24: 21.0000 mg | MEDICATED_PATCH | Freq: Every day | TRANSDERMAL | 0 refills | Status: DC

## 2019-08-03 MED ORDER — GUAIFENESIN ER 600 MG PO TB12: 600.0000 mg | ORAL_TABLET | Freq: Two times a day (BID) | ORAL | 0 refills | Status: DC

## 2019-08-03 MED ORDER — AZITHROMYCIN 250 MG PO TABS: 500.0000 mg | ORAL_TABLET | Freq: Every day | ORAL | 0 refills | Status: AC

## 2019-08-03 MED ORDER — INCRUSE ELLIPTA 62.5 MCG/INH IN AEPB: 1.0000 | INHALATION_SPRAY | Freq: Every day | RESPIRATORY_TRACT | 2 refills | Status: DC

## 2019-08-03 MED ORDER — POTASSIUM CHLORIDE CRYS ER 20 MEQ PO TBCR
40.0000 meq | EXTENDED_RELEASE_TABLET | Freq: Two times a day (BID) | ORAL | Status: DC
  Administered 2019-08-03: 40 meq via ORAL
  Filled 2019-08-03: qty 2

## 2019-08-03 MED ORDER — SPIRIVA RESPIMAT 2.5 MCG/ACT IN AERS: 2.0000 | INHALATION_SPRAY | Freq: Every day | RESPIRATORY_TRACT | 0 refills | Status: DC

## 2019-08-03 MED ORDER — IPRATROPIUM-ALBUTEROL 0.5-2.5 (3) MG/3ML IN SOLN: 3.0000 mL | Freq: Four times a day (QID) | RESPIRATORY_TRACT | 0 refills | Status: DC | PRN

## 2019-08-03 MED ORDER — METHYLPREDNISOLONE SODIUM SUCC 40 MG IJ SOLR: 40.0000 mg | Freq: Two times a day (BID) | INTRAMUSCULAR | Status: DC

## 2019-08-03 MED ORDER — PREDNISONE 10 MG (21) PO TBPK: ORAL_TABLET | ORAL | 0 refills | Status: DC

## 2019-08-03 MED ORDER — ALBUTEROL SULFATE HFA 108 (90 BASE) MCG/ACT IN AERS: 2.0000 | INHALATION_SPRAY | Freq: Four times a day (QID) | RESPIRATORY_TRACT | 0 refills | Status: DC | PRN

## 2019-08-03 NOTE — Progress Notes (Signed)
Physical Therapy Treatment Patient Details Name: Marissa Owens MRN: [id number] DOB: [birth year] Today's Date: 08/03/2019    History of Present Illness Pt is a 59 y/o female admitted secondary to COPD exacerbation. PMH includes COPD, DM, HTN, and tobacco abuse.     PT Comments    Pt is on room air. RN present and discussing d/c plans on entry. Resting SpO2 is 94%. Upon exertion SpO2 remains at 91-93%. Pt ambulates without AD and with supervision, taking short, slow steps. HEP reviewed and practiced. Pt has no questions at this time. D/C plans remain appropriate. PT will continue to follow pt acutely.   Follow Up Recommendations  No PT follow up     Equipment Recommendations  None recommended by PT       Precautions / Restrictions Precautions Precautions: Other (comment) Precaution Comments: Watch O2 sats Restrictions Weight Bearing Restrictions: No    Mobility  Bed Mobility Overal bed mobility: Modified Independent             General bed mobility comments: with HOB elevated  Transfers Overall transfer level: Needs assistance Equipment used: None   Sit to Stand: Supervision         General transfer comment: supervision for safety   Ambulation/Gait Ambulation/Gait assistance: Supervision Gait Distance (Feet): 300 Feet Assistive device: None Gait Pattern/deviations: Step-through pattern;Decreased stride length;Narrow base of support Gait velocity: Decreased Gait velocity interpretation: 1.31 - 2.62 ft/sec, indicative of limited community ambulator General Gait Details: continues to have slow, cautious gait. SpO2 remains within 91-93% throughout.          Balance Overall balance assessment: Needs assistance Sitting-balance support: No upper extremity supported Sitting balance-Leahy Scale: Good     Standing balance support: No upper extremity supported;During functional activity Standing balance-Leahy Scale: Fair Standing balance comment: Walks  carefully with small steps; narrow BOS                            Cognition Arousal/Alertness: Awake/alert Behavior During Therapy: WFL for tasks assessed/performed Overall Cognitive Status: Within Functional Limits for tasks assessed                                        Exercises Other Exercises Other Exercises: HEP provided and practiced- walking program discussed, sit<>stands, bridges, seated hip ABD with TB, and standing marching at [place] comments (skin integrity, edema, etc.): Pt is on room air. Resting SpO2 at 94%.      Pertinent Vitals/Pain Pain Assessment: No/denies pain           PT Goals (current goals can now be found in the care plan section) Acute Rehab PT Goals Patient Stated Goal: to go home PT Goal Formulation: With patient Time For Goal Achievement: 08/14/19 Potential to Achieve Goals: Good Progress towards PT goals: Progressing toward goals    Frequency    Min 3X/week      PT Plan Current plan remains appropriate       AM-PAC PT "6 Clicks" Mobility   Outcome Measure  Help needed turning from your back to your side while in a flat bed without using bedrails?: None Help needed moving from lying on your back to sitting on the side of a flat bed without using bedrails?: None Help needed moving to and from a bed to a chair (including a  wheelchair)?: None Help needed standing up from a chair using your arms (e.g., wheelchair or bedside chair)?: None Help needed to walk in hospital room?: None Help needed climbing 3-5 steps with a railing? : A Lot 6 Click Score: 22    End of Session   Activity Tolerance: Patient tolerated treatment well Patient left: in bed;with call bell/phone within reach Nurse Communication: Mobility status;Other (comment)(HEP provided, no questions) PT Visit Diagnosis: Other abnormalities of gait and mobility (R26.89)     Time: 2780-0447 PT Time Calculation  (min) (ACUTE ONLY): 35 min  Charges:  $Gait Training: 8-22 mins $Therapeutic Exercise: 8-22 mins                     Jodean Lima, PT, DPT Acute Rehabilitation Services Office [phone]    Jodean Lima 08/03/2019, 4:49 PM

## 2019-08-03 NOTE — TOC Progression Note (Signed)
Transition of Care [facility]) - Progression Note    Patient Details  Name: Marissa Owens MRN: [id number] Date of Birth: Mar 26, 1961  [facility] CM/SW Contact  Leone Haven, RN Phone Number: 08/03/2019, 12:46 PM  Clinical Narrative:    NCM spoke with patient, she states she would like to take the mask she is using for her neb machine, NCM notified Conservation officer, nature, she will be able to take that with her.  She also states she does not want a 3 n 1.  Per pt/ot eval no f/u needed. She has transportation at [employer].  She has no issues in paying for meds.        Expected Discharge Plan and Services                                                 Social Determinants of Health (SDOH) Interventions    Readmission Risk Interventions No flowsheet data found.

## 2019-08-03 NOTE — Progress Notes (Signed)
[name] to be D/C'd Home per MD order. Discussed with the patient and all questions fully answered.  Allergies as of 08/03/2019   No Known Allergies     Medication List    STOP taking these medications   albuterol (2.5 MG/3ML) 0.083% nebulizer solution Commonly known as: PROVENTIL Replaced by: albuterol 108 (90 Base) MCG/ACT inhaler   doxycycline 100 MG tablet Commonly known as: VIBRA-TABS   ibuprofen 800 MG tablet Commonly known as: ADVIL     TAKE these medications   Adult Mask Misc Needs an mask for her nebulizer machine   albuterol 108 (90 Base) MCG/ACT inhaler Commonly known as: VENTOLIN HFA Inhale 2 puffs into the lungs every 6 (six) hours as needed for wheezing or shortness of breath. Replaces: albuterol (2.5 MG/3ML) 0.083% nebulizer solution   amLODipine 10 MG tablet Commonly known as: NORVASC Take 1 tablet (10 mg total) by mouth daily.   azithromycin 250 MG tablet Commonly known as: ZITHROMAX Take 2 tablets (500 mg total) by mouth daily for 2 days.   Compressor/Nebulizer Misc 1 machine Dx : COPD   FLUoxetine 20 MG tablet Commonly known as: PROZAC Take 3 tablets (60 mg total) by mouth daily.   guaiFENesin 600 MG 12 hr tablet Commonly known as: MUCINEX Take 1 tablet (600 mg total) by mouth 2 (two) times daily.   Incruse Ellipta 62.5 MCG/INH Aepb Generic drug: umeclidinium bromide Inhale 1 puff into the lungs daily.   ipratropium-albuterol 0.5-2.5 (3) MG/3ML Soln Commonly known as: DUONEB Take 3 mLs by nebulization every 6 (six) hours as needed.   nicotine 21 mg/24hr patch Commonly known as: NICODERM CQ - dosed in mg/24 hours Place 1 patch (21 mg total) onto the skin daily. Start taking on: August 04, 2019   predniSONE 10 MG (21) Tbpk tablet Commonly known as: STERAPRED UNI-PAK 21 TAB Take 6 tablets on day 1, 5 tablets on day 2, 4 tablets on day 3, 3 tablets on day 4, 2 tablets on day 5, 1 tablet on day 6 and then stop day 7   risperiDONE 2  MG tablet Commonly known as: RISPERDAL Take 1 tablet (2 mg total) by mouth 2 (two) times daily.   Spiriva Respimat 2.5 MCG/ACT Aers Generic drug: Tiotropium Bromide Monohydrate Inhale 2 puffs into the lungs daily.   tiZANidine 4 MG tablet Commonly known as: Zanaflex Take 1-2 tablets (4-8 mg total) by mouth every 6 (six) hours as needed for muscle spasms.       VVS, Skin clean, dry and intact without evidence of skin break down, no evidence of skin tears noted.  IV catheter discontinued intact. Site without signs and symptoms of complications. Dressing and pressure applied.  An After Visit Summary was printed and given to the patient.  Patient escorted via WC, and D/C home via private auto.  Wonda Amis  08/03/2019 4:22 PM

## 2019-08-03 NOTE — Discharge Summary (Signed)
Physician Discharge Summary  Marissa JacobsonJudith J Owens ZOX:[id number] DOB: [birth year] DOA: 07/30/2019  PCP: Marissa Owens  Admit date: 07/30/2019 Discharge date: 08/03/2019  Admitted From: Home Disposition: Home  Recommendations for Outpatient Follow-up:  1. Follow up with PCP in 1-2 weeks 2. Follow-up with pulmonary on 08/14/2019 at 1:45 PM with Dr. [name] for outpatient COPD evaluation and management and official PFTs as well as a sleep study 3. Please obtain CMP/CBC, Mag, Phos in one week 4. Please follow up on the following pending results:  Home Health: No  Equipment/Devices: Nebulizer Supplies, 3in1   Discharge Condition: Stable   CODE STATUS: FULL CODE Diet recommendation: Heart healthy diet  Brief/Interim Summary: HPI per Dr. Tarry Kosachal Owens on 07/31/19 Marissa PrestoJudith J Jarrellis a 59 y.o.femalewith medical history significant ofCOPD, tobacco abuse comes in with several days of worsening shortness of breath and wheezing at home not relieved with her home albuterol inhaler. She denies any fevers. She denies nausea vomiting or diarrhea. She is trying to quit smoking. She does not require supplemental oxygen at home. Patient ambulated dropped down to 84% on room air with ambulation therefore referred for admission for COPD exacerbation causing hypoxia. She is feeling some better but not back to normal.  **Interim History  Respiratory status is slowly improving however she is currently now back to baseline.  Was off of oxygen was started this morning.  But desaturated and had to be placed back on 2 L.  Repeated her ambulatory home O2 screen and she did not desaturate below 94%.  She did not require home oxygen at discharge but she was asking for nightly use however she will need to have an outpatient evaluation with pulmonary and have a sleep study done.  She is stable for discharge and her COPD exacerbation is improving as she had minimal wheezing.  She was changed to a p.o. steroid taper and given  nebulizers for discharge and a pulmonary appointment was made for her on 08/14/2019 at 1:45 PM with Dr. Durel SaltsNikita Owens.  She will also need to see her PCP within 1 to 2 weeks.  Discharge Diagnoses:  Principal Problem:   COPD exacerbation (HCC) Active Problems:   Acute respiratory failure (HCC)   Tobacco abuse  Acute Respiratory Failure with Hypoxia -In the setting of COPD exacerbation as below -Patient's SARS-CoV-2 testing was negative -Continue supplemental oxygen via nasal cannula and wean O2 as tolerated; will able to be weaned to room air -Continuous pulse oximetry and maintain O2 saturations greater than 90% -SpO2: 92 % O2 Flow Rate (L/min): 2 L/min however she did not actually require the oxygen -Ambulatory home O2 screen prior to discharge showed that she did not desaturate -Treatment as below -Follow-up with pulmonary in the outpatient setting and will need an official PFTs and sleep study  Acute Exacerbation of COPD, improved -Currently receiving duo nebs scheduled every 4 hours as well as DuoNeb every 4 hours as needed for wheezing -Continue with Solu-Medrol 80 mg every 12 and wean to 40 every 12 and improved.  Will transition to p.o. steroid taper for 7 days -Also continue with supplemental oxygen via nasal cannula -Continue with guaifenesin 600 mg p.o. twice daily -Encourage flutter valve and incentive spirometry -Continue with azithromycin 500 g p.o. daily for 4 doses for pulmonary inflammation and will continue at discharge -He also receiving ceftriaxone 1 g every 24 hours for suspected CAP but stopped this yesterday  -SARS-CoV-2 testing was negative -Strep pneumo and Legionella urine antigen with was negative -Repeat  Chest x-ray showed "Heart is normal size. Decreasing lung volumes with increasing bibasilar atelectasis. No effusions. No acute bony abnormality" -Respiratory virus panel was never done  -Continue with Mucinex 600 mg p.o. twice daily as well as  guaifenesin-codeine 10 mL p.o. every hour as needed for cough -Continue smoking cessation counseling efforts -She had an ambulatory home O2 screen prior to discharge and she did not desaturate with her lowest saturation being 94 to 95% -Follow-up with pulmonary in outpatient setting for official PFTs and further management; appointment made as above -We will discharge her on albuterol inhaler, Spiriva, as well as Incruse Ellipta as well as as needed DuoNeb breathing treatments with a mask for her nebulizer  Hypertension -Currently not on any antiantihypertensives  but strongly recommend that she resume her home amlodipine at discharge as she states that she is no longer taking this -Will need to continue monitor carefully  Tobacco Abuse -Smoking cessation counseling given as she has been a longtime smoker -Continue Nicotine Patch 21 mg transdermally every 24 hours at discharge -Continue to encourage and educate on quitting smoking.  Leukocytosis -Likely worsened in the setting of steroid demargination -Patient WBC went from 6.8 and increased to 14.2; today is 12.3 -Continue to monitor for signs and symptoms of infection we will stop her IV ceftriaxone -Repeat CBC in a.m.  Hyperglycemia in the setting of Prediabetes -Worsened in the setting of her steroid demargination -Patient's blood sugars on daily BMP/CMP's have been ranging from 117 - 282 -Hemoglobin A1c was 6.0 -Continue to monitor blood sugars carefully and if necessary will place on sensitive NovoLog/scale AC -Follow-up with PCP for further management  Hypophosphatemia -Patient's phosphorus level this morning was 3.1 -Replete with p.o. K-Phos Neutral 500 mg p.o. twice daily x2 doses yesterday -continue to monitor and replete as necessary -Repeat phosphorus level in a.m  Hypokalemia -Patient potassium this morning was 3.4  -Replete with p.o. KCl 40 mEq twice daily x2 doses  -Continue to monitor and replete as  necessary -Repeat CMP in the a.m.  Discharge Instructions  Discharge Instructions    Call Owens for:  difficulty breathing, headache or visual disturbances   Complete by: As directed    Call Owens for:  extreme fatigue   Complete by: As directed    Call Owens for:  hives   Complete by: As directed    Call Owens for:  persistant dizziness or light-headedness   Complete by: As directed    Call Owens for:  persistant nausea and vomiting   Complete by: As directed    Call Owens for:  redness, tenderness, or signs of infection (pain, swelling, redness, odor or green/yellow discharge around incision site)   Complete by: As directed    Call Owens for:  severe uncontrolled pain   Complete by: As directed    Call Owens for:  temperature >100.4   Complete by: As directed    Diet - low sodium heart healthy   Complete by: As directed    Discharge instructions   Complete by: As directed    You were cared for by a hospitalist during your hospital stay. If you have any questions about your discharge medications or the care you received while you were in the hospital after you are discharged, you can call the unit and ask to speak with the hospitalist on call if the hospitalist that took care of you is not available. Once you are discharged, your primary care physician will handle any further medical issues.  Please note that NO REFILLS for any discharge medications will be authorized once you are discharged, as it is imperative that you return to your primary care physician (or establish a relationship with a primary care physician if you do not have one) for your aftercare needs so that they can reassess your need for medications and monitor your lab values.  Follow up with PCP within 1 week and Pulmonary on 08/14/19 at 1:45 pm. Take all medications as prescribed. If symptoms change or worsen please return to the ED for evaluation   Increase activity slowly   Complete by: As directed      Allergies as of 08/03/2019   No  Known Allergies     Medication List    STOP taking these medications   albuterol (2.5 MG/3ML) 0.083% nebulizer solution Commonly known as: PROVENTIL Replaced by: albuterol 108 (90 Base) MCG/ACT inhaler   doxycycline 100 MG tablet Commonly known as: VIBRA-TABS   ibuprofen 800 MG tablet Commonly known as: ADVIL     TAKE these medications   Adult Mask Misc Needs an mask for her nebulizer machine   albuterol 108 (90 Base) MCG/ACT inhaler Commonly known as: VENTOLIN HFA Inhale 2 puffs into the lungs every 6 (six) hours as needed for wheezing or shortness of breath. Replaces: albuterol (2.5 MG/3ML) 0.083% nebulizer solution   amLODipine 10 MG tablet Commonly known as: NORVASC Take 1 tablet (10 mg total) by mouth daily.   azithromycin 250 MG tablet Commonly known as: ZITHROMAX Take 2 tablets (500 mg total) by mouth daily for 2 days.   Compressor/Nebulizer Misc 1 machine Dx : COPD   FLUoxetine 20 MG tablet Commonly known as: PROZAC Take 3 tablets (60 mg total) by mouth daily.   guaiFENesin 600 MG 12 hr tablet Commonly known as: MUCINEX Take 1 tablet (600 mg total) by mouth 2 (two) times daily.   Incruse Ellipta 62.5 MCG/INH Aepb Generic drug: umeclidinium bromide Inhale 1 puff into the lungs daily.   ipratropium-albuterol 0.5-2.5 (3) MG/3ML Soln Commonly known as: DUONEB Take 3 mLs by nebulization every 6 (six) hours as needed.   nicotine 21 mg/24hr patch Commonly known as: NICODERM CQ - dosed in mg/24 hours Place 1 patch (21 mg total) onto the skin daily. Start taking on: August 04, 2019   predniSONE 10 MG (21) Tbpk tablet Commonly known as: STERAPRED UNI-PAK 21 TAB Take 6 tablets on day 1, 5 tablets on day 2, 4 tablets on day 3, 3 tablets on day 4, 2 tablets on day 5, 1 tablet on day 6 and then stop day 7   risperiDONE 2 MG tablet Commonly known as: RISPERDAL Take 1 tablet (2 mg total) by mouth 2 (two) times daily.   Spiriva Respimat 2.5 MCG/ACT  Aers Generic drug: Tiotropium Bromide Monohydrate Inhale 2 puffs into the lungs daily.   tiZANidine 4 MG tablet Commonly known as: Zanaflex Take 1-2 tablets (4-8 mg total) by mouth every 6 (six) hours as needed for muscle spasms.      Follow-up Information    Marissa Saupe, Owens. Call.   Specialty: Family Medicine Why: Follow up within 1 week Contact information: [address] [place] [address] [phone]        Charlott Holler, Owens. Go on 08/14/2019.   Specialty: Pulmonary Disease Why: Follow up for Post-Hospitalization made for 08/14/2019 at 1:45 with Dr. Celine Mans for official PFT's, COPD follow up and outpatient Sleep Study and evaluation for nocturnal O2.  Contact information: [phone] [place] [address] [phone]          No Known Allergies  Consultations:  None  Procedures/Studies: DG Chest 2 View  Result Date: 07/31/2019 CLINICAL DATA:  COPD. EXAM: CHEST - 2 VIEW COMPARISON:  07/03/2019 FINDINGS: The heart size is stable. Aortic calcifications are noted. There is stable atelectasis versus scarring at the left lung base. The lungs are somewhat hyperexpanded. There is no pneumothorax or large focal infiltrate. No pleural effusion. No acute osseous abnormality. IMPRESSION: No acute cardiopulmonary disease. Stable atelectasis versus scarring at the left lung base. Hyperexpanded lungs which can be seen in patients with COPD. Electronically Signed   By: Katherine Mantle M.D.   On: 07/31/2019 00:48   DG CHEST PORT 1 VIEW  Result Date: 08/02/2019 CLINICAL DATA:  Shortness of breath EXAM: PORTABLE CHEST 1 VIEW COMPARISON:  07/31/2019 FINDINGS: Heart is normal size. Decreasing lung volumes with increasing bibasilar atelectasis. No effusions. No acute bony abnormality. IMPRESSION: Increasing bibasilar atelectasis. Electronically Signed   By: Charlett Nose M.D.   On: 08/02/2019 09:59     Subjective: Seen and examined at bedside and states that she is  feeling better than yesterday.  Thinks her cough is improving.  She is not as wheezy as yesterday.  No other concerns or complaints at this time and she is stable for discharge only to follow-up with PCP and pulmonary in the outpatient setting and she is understandable and agreeable with the plan of care.  Discharge Exam: Vitals:   08/03/19 1110 08/03/19 1200  BP:    Pulse:    Resp:    Temp:    SpO2: 93% 96%   Vitals:   08/03/19 0736 08/03/19 0752 08/03/19 1110 08/03/19 1200  BP:  (!) 141/92    Pulse:  79    Resp:  19    Temp:  98 F (36.7 C)    TempSrc:      SpO2: 94% 96% 93% 96%  Weight:      Height:       General: Pt is alert, awake, not in acute distress Cardiovascular: RRR, S1/S2 +, no rubs, no gallops Respiratory: Diminished bilaterally, Slight wheezing, no rhonchi; Abdominal: Soft, NT, ND, bowel sounds + Extremities: no edema, no cyanosis  The results of significant diagnostics from this hospitalization (including imaging, microbiology, ancillary and laboratory) are listed below for reference.    Microbiology: Recent Results (from the past 240 hour(s))  SARS CORONAVIRUS 2 (TAT 6-24 HRS) Nasopharyngeal Nasopharyngeal Swab     Status: None   Collection Time: 07/31/19  4:12 AM   Specimen: Nasopharyngeal Swab  Result Value Ref Range Status   SARS Coronavirus 2 NEGATIVE NEGATIVE Final    Comment: (NOTE) SARS-CoV-2 target nucleic acids are NOT DETECTED. The SARS-CoV-2 RNA is generally detectable in upper and lower respiratory specimens during the acute phase of infection. Negative results do not preclude SARS-CoV-2 infection, do not rule out co-infections with other pathogens, and should not be used as the sole basis for treatment or other patient management decisions. Negative results must be combined with clinical observations, patient history, and epidemiological information. The expected result is Negative. Fact Sheet for  Patients: HairSlick.no Fact Sheet for Healthcare Providers: quierodirigir.com This test is not yet approved or cleared by the [place] FDA and  has been authorized for detection and/or diagnosis of SARS-CoV-2 by FDA under an Emergency Use Authorization (EUA). This EUA will remain  in effect (meaning this test can be used) for  the duration of the COVID-19 declaration under Section 56 4(b)(1) of the Act, 21 U.S.C. section 360bbb-3(b)(1), unless the authorization is terminated or revoked sooner. Performed at [facility], [place] [address], [place], [place] [address]     Labs: BNP (last 3 results) No results for input(s): BNP in the last 8760 hours. Basic Metabolic Panel: Recent Labs  Lab 07/30/19 2333 08/01/19 0118 08/02/19 0227 08/03/19 0213  NA 142 139 139 138  K 3.5 3.8 3.6 3.4*  CL 104 102 100 98  CO2 27 28 30 28   GLUCOSE 117* 189* 223* 282*  BUN <5* 9 11 16   CREATININE 0.80 0.72 0.74 0.79  CALCIUM 9.2 9.1 9.1 9.0  MG  --  2.3 2.4 2.3  PHOS  --  2.2* 2.3* 3.1   Liver Function Tests: Recent Labs  Lab 08/01/19 0118 08/02/19 0227 08/03/19 0213  AST 33 25 18  ALT 36 30 23  ALKPHOS 88 103 85  BILITOT 0.6 0.5 0.2*  PROT 7.0 7.2 6.4*  ALBUMIN 3.4* 3.6 3.2*   No results for input(s): LIPASE, AMYLASE in the last 168 hours. No results for input(s): AMMONIA in the last 168 hours. CBC: Recent Labs  Lab 07/30/19 2333 07/31/19 0901 08/01/19 0118 08/02/19 0227 08/03/19 0213  WBC 6.8 4.8 13.6* 14.2* 12.3*  NEUTROABS  --  3.9 10.8* 12.3* 10.3*  HGB 14.8 15.0 14.7 14.9 14.2  HCT 44.3 46.2* 43.9 45.0 43.5  MCV 96.5 97.7 95.4 95.9 96.9  PLT 197 197 220 234 204   Cardiac Enzymes: No results for input(s): CKTOTAL, CKMB, CKMBINDEX, TROPONINI in the last 168 hours. BNP: Invalid input(s): POCBNP CBG: No results for input(s): GLUCAP in the last 168 hours. D-Dimer No results for input(s): DDIMER in the last  72 hours. Hgb A1c No results for input(s): HGBA1C in the last 72 hours. Lipid Profile No results for input(s): CHOL, HDL, LDLCALC, TRIG, CHOLHDL, LDLDIRECT in the last 72 hours. Thyroid function studies No results for input(s): TSH, T4TOTAL, T3FREE, THYROIDAB in the last 72 hours.  Invalid input(s): FREET3 Anemia work up No results for input(s): VITAMINB12, FOLATE, FERRITIN, TIBC, IRON, RETICCTPCT in the last 72 hours. Urinalysis    Component Value Date/Time   COLORURINE YELLOW 07/03/2019 1252   APPEARANCEUR CLEAR 07/03/2019 1252   LABSPEC 1.026 07/03/2019 1252   PHURINE 5.0 07/03/2019 1252   GLUCOSEU NEGATIVE 07/03/2019 1252   HGBUR NEGATIVE 07/03/2019 1252   BILIRUBINUR NEGATIVE 07/03/2019 1252   KETONESUR NEGATIVE 07/03/2019 1252   PROTEINUR NEGATIVE 07/03/2019 1252   UROBILINOGEN 0.2 05/22/2019 0922   NITRITE NEGATIVE 07/03/2019 1252   LEUKOCYTESUR NEGATIVE 07/03/2019 1252   Sepsis Labs Invalid input(s): PROCALCITONIN,  WBC,  LACTICIDVEN Microbiology Recent Results (from the past 240 hour(s))  SARS CORONAVIRUS 2 (TAT 6-24 HRS) Nasopharyngeal Nasopharyngeal Swab     Status: None   Collection Time: 07/31/19  4:12 AM   Specimen: Nasopharyngeal Swab  Result Value Ref Range Status   SARS Coronavirus 2 NEGATIVE NEGATIVE Final    Comment: (NOTE) SARS-CoV-2 target nucleic acids are NOT DETECTED. The SARS-CoV-2 RNA is generally detectable in upper and lower respiratory specimens during the acute phase of infection. Negative results do not preclude SARS-CoV-2 infection, do not rule out co-infections with other pathogens, and should not be used as the sole basis for treatment or other patient management decisions. Negative results must be combined with clinical observations, patient history, and epidemiological information. The expected result is Negative. Fact Sheet for Patients: SugarRoll.be Fact  Sheet for Healthcare  Providers: quierodirigir.com This test is not yet approved or cleared by the [place] FDA and  has been authorized for detection and/or diagnosis of SARS-CoV-2 by FDA under an Emergency Use Authorization (EUA). This EUA will remain  in effect (meaning this test can be used) for the duration of the COVID-19 declaration under Section 56 4(b)(1) of the Act, 21 U.S.C. section 360bbb-3(b)(1), unless the authorization is terminated or revoked sooner. Performed at [facility], [facility], [address], [place], [place] [address]    Time coordinating discharge: 35 minutes  SIGNED:  Merlene Laughter, DO [facility] 08/03/2019, 7:55 PM Pager is on AMION  If 7PM-7AM, please contact night-coverage [email or web address] Password [email or web address]

## 2019-08-04 ENCOUNTER — Telehealth: Payer: Self-pay

## 2019-08-04 NOTE — Telephone Encounter (Signed)
Transition Care Management Follow-up Telephone Call  Date of discharge and from where: 08/03/2019 From [facility]   How have you been since you were released from the hospital? Pt stated much better  Any questions or concerns?  None verbalized  Items Reviewed:  Did the pt receive and understand the discharge instructions provided?Yes   Medications obtained and verified?  Most of them were obtain/ Nicotine patch was left / Addressed the importance to refrain from any smoking / Verbalized understanding   Any new allergies since your discharge? None    Dietary orders reviewed?YES    Do you have support at home? YES SON   Functional Questionnaire: (I = Independent and D = Dependent) ADLs:  I  Bathing/Dressing- I  Meal Prep- I  Eating- I  Maintaining continence- I  Transferring/Ambulation- I  Managing Meds- I  Follow up appointments reviewed:   PCP Hospital f/u appt confirmed?  Scheduled to see Marissa Owens  on 08/10/2019 .  Specialist Hospital f/u appt confirmed? Scheduled to see pulmonologist on 08/14/2019   Are transportation arrangements needed? Yes son will drive   If their condition worsens, is the pt aware to call PCP or go to the Emergency Dept.? YES   Was the patient provided with contact information for the PCP's office or ED? YES  Was to pt encouraged to call back with questions or concerns?YES pt is aware / contact nr. and info given

## 2019-08-09 NOTE — Progress Notes (Signed)
Patient ID: Marissa Owens, female   DOB: 1960/11/06, 59 y.o.   MRN: [id number] Virtual Visit via Telephone Note  I connected with Marissa Owens on 08/10/19 at  3:50 PM EDT by telephone and verified that I am speaking with the correct person using two identifiers.   I discussed the limitations, risks, security and privacy concerns of performing an evaluation and management service by telephone and the availability of in person appointments. I also discussed with the patient that there may be a patient responsible charge related to this service. The patient expressed understanding and agreed to proceed.  PATIENT visit by telephone virtually in the context of Covid-19 pandemic. Patient location:  home My Location:  [facility] office Persons on the call:  Me and the patient   History of Present Illness: After hospitalization 3/7-3/03/2020.  Breathing is improving.  Energy is returning.  Not requiring O2.  She has pulmonology follow-up on Monday.  No fever.   She has been having some depression with the pandemic and thinks it might be helpful to restart fluoxetine.  She did well on this previously.  She denies SI/HI.  She has been out of BP meds for a while and needs RF.  She denies HA/CP/Dizziness.  From discharge summary: Admit date: 07/30/2019 Discharge date: 08/03/2019  Admitted From: Home Disposition: Home  Recommendations for Outpatient Follow-up:  1. Follow up with PCP in 1-2 weeks 2. Follow-up with pulmonary on 08/14/2019 at 1:45 PM with Dr. Celine Mans for outpatient COPD evaluation and management and official PFTs as well as a sleep study 3. Please obtain CMP/CBC, Mag, Phos in one week 4. Please follow up on the following pending results:  Home Health: No  Equipment/Devices: Nebulizer Supplies, 3in1   Discharge Condition: Stable              CODE STATUS: FULL CODE Diet recommendation: Heart healthy diet  Brief/Interim Summary: HPI per Dr. Tarry Kos on 07/31/19 Marissa Owens a 59 y.o.femalewith medical history significant ofCOPD, tobacco abuse comes in with several days of worsening shortness of breath and wheezing at home not relieved with her home albuterol inhaler. She denies any fevers. She denies nausea vomiting or diarrhea. She is trying to quit smoking. She does not require supplemental oxygen at home. Patient ambulated dropped down to 84% on room air with ambulation therefore referred for admission for COPD exacerbation causing hypoxia. She is feeling some better but not back to normal.  **Interim History  Respiratorystatus is slowly improving however she is currently now back to baseline. Was off of oxygen was started this morning. But desaturated and had to be placed back on 2 L.  Repeated her ambulatory home O2 screen and she did not desaturate below 94%.  She did not require home oxygen at discharge but she was asking for nightly use however she will need to have an outpatient evaluation with pulmonary and have a sleep study done.  She is stable for discharge and her COPD exacerbation is improving as she had minimal wheezing.  She was changed to a p.o. steroid taper and given nebulizers for discharge and a pulmonary appointment was made for her on 08/14/2019 at 1:45 PM with Dr. Durel Salts.  She will also need to see her PCP within 1 to 2 weeks.  Discharge Diagnoses:  Principal Problem:   COPD exacerbation (HCC) Active Problems:   Acute respiratory failure (HCC)   Tobacco abuse  Acute Respiratory Failure with Hypoxia -In the setting of COPD  exacerbation as below -Patient'sSARS-CoV-2 testing was negative -Continue supplemental oxygen via nasal cannula and wean O2 as tolerated; will able to be weaned to room air -Continuous pulse oximetry and maintain O2 saturations greater than 90% -SpO2: 92 % O2 Flow Rate (L/min): 2 L/min however she did not actually require the oxygen -Ambulatory home O2 screen prior to discharge showed that she  did not desaturate -Treatment as below -Follow-up with pulmonary in the outpatient setting and will need an official PFTs and sleep study  AcuteExacerbation of COPD, improved -Currently receiving duo nebs scheduled every 4 hours as well as DuoNeb every 4 hours as needed for wheezing -Continue with Solu-Medrol 80 mg every 12 and wean to 40 every 12 and improved.  Will transition to p.o. steroid taper for 7 days -Also continue with supplemental oxygen via nasal cannula -Continue with guaifenesin 600 mg p.o. twice daily -Encourage flutter valve and incentive spirometry -Continue with azithromycin 500 g p.o. daily for 4 doses for pulmonary inflammation and will continue at discharge -He also receiving ceftriaxone 1 g every 24 hours for suspected CAP but stopped this yesterday -SARS-CoV-2 testing was negative -Strep pneumo and Legionella urine antigen with was negative -RepeatChest x-ray showed"Heart is normal size. Decreasing lung volumes with increasing bibasilar atelectasis. No effusions. No acute bony abnormality" -Respiratory virus panel was never done -Continue with Mucinex 600 mg p.o. twice daily as well as guaifenesin-codeine 10 mL p.o. every hour as needed for cough -Continue smoking cessation counseling efforts -She had an ambulatory home O2 screen prior to discharge and she did not desaturate with her lowest saturation being 94 to 95% -Follow-up with pulmonary in outpatient setting for official PFTs and further management; appointment made as above -We will discharge her on albuterol inhaler, Spiriva, as well as Incruse Ellipta as well as as needed DuoNeb breathing treatments with a mask for her nebulizer  Hypertension -Currently not on any antiantihypertensives but strongly recommend that she resume her home amlodipine at discharge as she states that she is no longer taking this -Will need to continue monitor carefully  TobaccoAbuse -Smoking cessation counseling given as  she has been a longtime smoker -ContinueNicotinePatch 21 mg transdermally every 24 hours at discharge -Continue to encourage and educate on quitting smoking.  Leukocytosis -Likely worsened in the setting of steroid demargination -Patient WBC went from 6.8 and increased to 14.2; today is 12.3 -Continue to monitor for signs and symptoms of infection we will stop her IV ceftriaxone -Repeat CBC in a.m.  Hyperglycemia in the setting ofPrediabetes -Worsened in the setting of her steroid demargination -Patient's blood sugars on daily BMP/CMP's have been ranging from 117-282 -Hemoglobin A1c was 6.0 -Continue to monitor blood sugars carefully and if necessary will place on sensitive NovoLog/scale AC -Follow-up with PCP for further management  Hypophosphatemia -Patient's phosphorus level this morning was 3.1 -Replete with p.o. K-Phos Neutral 500 mg p.o. twice daily x2 doses yesterday -continue to monitor and replete as necessary -Repeat phosphorus level in a.m  Hypokalemia -Patient potassium this morning was 3.4  -Replete with p.o. KCl 40 mEq twice daily x2 doses  -Continue to monitor and replete as necessary -Repeat CMP in the a.m.   Observations/Objective:  NAD.  A&Ox3   Assessment and Plan: 1. Hypertension, unspecified type Not controlled;  Has been off meds; resume meds - amLODipine (NORVASC) 10 MG tablet; Take 1 tablet (10 mg total) by mouth daily.  Dispense: 30 tablet; Refill: 2  2. Depression, unspecified depression type resume - FLUoxetine (PROZAC) 20  MG tablet; Take 3 tablets (60 mg total) by mouth daily.  Dispense: 90 tablet; Refill: 2  3. Acute respiratory failure with hypoxia (HCC) Keep pulmonary follow-up  4. Hospital discharge follow-up improving  5. Tobacco abuse Smoking and dangers of nicotine have been discussed at length. Long term health consequences of smoking reviewed in detail.  Methods for helping with cessation have been reviewed.  Patient  expresses understanding.   Follow Up Instructions: See PCP in about 3 weeks for follow-up   I discussed the assessment and treatment plan with the patient. The patient was provided an opportunity to ask questions and all were answered. The patient agreed with the plan and demonstrated an understanding of the instructions.   The patient was advised to call back or seek an in-person evaluation if the symptoms worsen or if the condition fails to improve as anticipated.  I provided 12 minutes of non-face-to-face time during this encounter.   Georgian Co, PA-C

## 2019-08-10 ENCOUNTER — Other Ambulatory Visit: Payer: Self-pay

## 2019-08-10 ENCOUNTER — Ambulatory Visit: Payer: Managed Care, Other (non HMO) | Attending: Family Medicine | Admitting: Physician Assistant

## 2019-08-10 DIAGNOSIS — I10 Essential (primary) hypertension: Secondary | ICD-10-CM | POA: Diagnosis not present

## 2019-08-10 DIAGNOSIS — F329 Major depressive disorder, single episode, unspecified: Secondary | ICD-10-CM | POA: Diagnosis not present

## 2019-08-10 DIAGNOSIS — J9601 Acute respiratory failure with hypoxia: Secondary | ICD-10-CM

## 2019-08-10 DIAGNOSIS — F32A Depression, unspecified: Secondary | ICD-10-CM

## 2019-08-10 DIAGNOSIS — Z09 Encounter for follow-up examination after completed treatment for conditions other than malignant neoplasm: Secondary | ICD-10-CM

## 2019-08-10 DIAGNOSIS — Z720 Tobacco use: Secondary | ICD-10-CM

## 2019-08-10 MED ORDER — FLUOXETINE HCL 20 MG PO TABS: 60.0000 mg | ORAL_TABLET | Freq: Every day | ORAL | 2 refills | Status: DC

## 2019-08-10 MED ORDER — AMLODIPINE BESYLATE 10 MG PO TABS: 10.0000 mg | ORAL_TABLET | Freq: Every day | ORAL | 2 refills | Status: DC

## 2019-08-14 ENCOUNTER — Ambulatory Visit (INDEPENDENT_AMBULATORY_CARE_PROVIDER_SITE_OTHER): Payer: Managed Care, Other (non HMO) | Admitting: Internal Medicine

## 2019-08-14 ENCOUNTER — Other Ambulatory Visit: Payer: Self-pay

## 2019-08-14 ENCOUNTER — Encounter: Payer: Self-pay | Admitting: Internal Medicine

## 2019-08-14 VITALS — BP 130/90 | HR 96 | Temp 98.4°F | Ht 65.0 in | Wt 163.4 lb

## 2019-08-14 DIAGNOSIS — J410 Simple chronic bronchitis: Secondary | ICD-10-CM | POA: Diagnosis not present

## 2019-08-14 DIAGNOSIS — G4733 Obstructive sleep apnea (adult) (pediatric): Secondary | ICD-10-CM

## 2019-08-14 DIAGNOSIS — Z716 Tobacco abuse counseling: Secondary | ICD-10-CM

## 2019-08-14 MED ORDER — BREO ELLIPTA 200-25 MCG/INH IN AEPB: 1.0000 | INHALATION_SPRAY | Freq: Every day | RESPIRATORY_TRACT | 5 refills | Status: DC

## 2019-08-14 MED ORDER — BREO ELLIPTA 200-25 MCG/INH IN AEPB: 1.0000 | INHALATION_SPRAY | Freq: Every day | RESPIRATORY_TRACT | 0 refills | Status: DC

## 2019-08-14 MED ORDER — ALBUTEROL SULFATE (2.5 MG/3ML) 0.083% IN NEBU: 2.5000 mg | INHALATION_SOLUTION | Freq: Four times a day (QID) | RESPIRATORY_TRACT | 12 refills | Status: DC | PRN

## 2019-08-14 NOTE — Patient Instructions (Signed)
The patient should have follow up scheduled with myself in 2 months.   Prior to next visit patient should have: Spirometry  Start Breo once a day Continue Incruse once a day Take albuterol nebulizers or inhaler as needed.    Understanding COPD   What is COPD? COPD stands for chronic obstructive pulmonary (lung) disease. COPD is a general term used for several lung diseases.  COPD is an umbrella term and encompasses other  common diseases in this group like chronic bronchitis and emphysema. Chronic asthma may also be included in this group. While some patients with COPD have only chronic bronchitis or emphysema, most patients have a combination of both.  You might hear these terms used in exchange for one another.   COPD adds to the work of the heart. Diseased lungs may reduce the amount of oxygen that goes to the blood. High blood pressure in blood vessels from the heart to the lungs makes it difficult for the heart to pump. Lung disease can also cause the body to produce too many red blood cells which may make the blood thicker and harder to pump.   Patients who have COPD with low oxygen levels may develop an enlarged heart (cor pulmonale). This condition weakens the heart and causes increased shortness of breath and swelling in the legs and feet.   Chronic bronchitis Chronic bronchitis is irritation and inflammation (swelling) of the lining in the bronchial tubes (air passages). The irritation causes coughing and an excess amount of mucus in the airways. The swelling makes it difficult to get air in and out of the lungs. The small, hair-like structures on the inside of the airways (called cilia) may be damaged by the irritation. The cilia are then unable to help clean mucus from the airways.  Bronchitis is generally considered to be chronic when you have: a productive cough (cough up mucus) and shortness of breath that lasts about 3 months or more each year for 2 or more years in a row. Your  doctor may define chronic bronchitis differently.   Emphysema Emphysema is the destruction, or breakdown, of the walls of the alveoli (air sacs) located at the end of the bronchial tubes. The damaged alveoli are not able to exchange oxygen and carbon dioxide between the lungs and the blood. The bronchioles lose their elasticity and collapse when you exhale, trapping air in the lungs. The trapped air keeps fresh air and oxygen from entering the lungs.   Who is affected by COPD? Emphysema and chronic bronchitis affect approximately 16 million people in the [place], or close to 11 percent of the population.   Symptoms of COPD   Shortness of breath   Shortness of breath with mild exercise (walking, using the stairs, etc.)   Chronic, productive cough (with mucus)   A feeling of "tightness" in the chest   Wheezing   What causes COPD? The two primary causes of COPD are cigarette smoking and alpha1-antitrypsin (AAT) deficiency. Air pollution and occupational dusts may also contribute to COPD, especially when the person exposed to these substances is a cigarette smoker.  Cigarette smoke causes COPD by irritating the airways and creating inflammation that narrows the airways, making it more difficult to breathe. Cigarette smoke also causes the cilia to stop working properly so mucus and trapped particles are not cleaned from the airways. As a result, chronic cough and excess mucus production develop, leading to chronic bronchitis.  In some people, chronic bronchitis and infections can lead to  destruction of the small airways, or emphysema.  AAT deficiency, an inherited disorder, can also lead to emphysema. Alpha antitrypsin (AAT) is a protective material produced in the liver and transported to the lungs to help combat inflammation. When there is not enough of the chemical AAT, the body is no longer protected from an enzyme in the white blood cells.   How is COPD diagnosed?  To diagnose COPD,  the physician needs to know: . Do you smoke?  . Have you had chronic exposure to dust or air pollutants?  . Do other members of your family have lung disease?  Marland Kitchen Are you short of breath?  . Do you get short of breath with exercise?  Marland Kitchen Do you have chronic cough and/or wheezing?  Marland Kitchen Do you cough up excess mucus?  To help with the diagnosis, the physician will conduct a thorough physical exam which includes:  1. Listening to your lungs and heart  2. Checking your blood pressure and pulse  3. Examining your nose and throat  4. Checking your feet and ankles for swelling   Laboratory and other tests Several laboratory and other tests are needed to confirm a diagnosis of COPD. These tests may include:  . Chest X-ray to look for lung changes that could be caused by COPD  .  Spirometry and pulmonary function tests (PFTs) to determine lung volume and air flow  . Pulse oximetry to measure the saturation of oxygen in the blood  . Arterial blood gases (ABGs) to determine the amount of oxygen and carbon dioxide in the blood  . Exercise testing to determine if the oxygen level in the blood drops during exercise   Treatment In the beginning stages of COPD, there is minimal shortness of breath that may be noticed only during exercise. As the disease progresses, shortness of breath may worsen and you may need to wear an oxygen device.   To help control other symptoms of COPD, the following treatments and lifestyle changes may be prescribed.  . Quitting smoking  . Avoiding cigarette smoke and other irritants  . Taking medications including: a. bronchodilators b. anti-inflammatory agents c. oxygen d. antibiotics  . Maintaining a healthy diet  . Following a structured exercise program such as pulmonary rehabilitation . Preventing respiratory infections  . Controlling stress   If your COPD progresses, you may be eligible to be evaluated for lung volume reduction surgery or lung transplantation. You  may also be eligible to participate in certain clinical trials (research studies). Ask your health care providers about studies being conducted in your hospital.   What is the outlook? Although COPD can not be cured, its symptoms can be treated and your quality of life can be improved. Your prognosis or outlook for the future will depend on how well your lungs are functioning, your symptoms, and how well you respond to and follow your treatment plan.

## 2019-08-14 NOTE — Progress Notes (Signed)
Patient seen in the office today and instructed on use of Breo 200.  Patient expressed understanding and demonstrated technique.  

## 2019-08-14 NOTE — Addendum Note (Signed)
Addended by: Delrae Rend on: 08/14/2019 04:02 PM   Modules accepted: Orders

## 2019-08-14 NOTE — Progress Notes (Signed)
Marissa Owens    [id number]    April 21, 1961  Primary Care Physician:Fulp, Ander Gaster, MD  Referring Physician: Antony Blackbird, MD [employer],  [place] [id number] Reason for Consultation: shortness of breath Date of Consultation: 08/14/2019  Chief complaint:   Chief Complaint  Patient presents with  . Consult    recent d/c from hospital 3/11,sob, wheezing with exerting.  coughing, not productive. hx COPD     HPI:  Marissa Owens is a 59 y.o. woman who presents with Progressing shortness of breath over the last 2-3 years. Recent hospital stay for COPD exacerbation. Discharged on prednisone.   Has albuterol inhaler 3-4 times/day.  Duoneb 3-4 times/ay.  Incruse - once/day  Has had repeated hospitalizations for her breathing - twice in the last 3 months, before that 3 in 2019.   Cut down on smoking since discharge from hospital from 1ppd down to 6 cigarettes total in the last 10 days.  Has wheezing, chest tightness, no chest pain.   She self-discontinued risperdal and is taking her brother's seroquel to help her sleep.  OBSTRUCTIVE SLEEP APNEA SCREENING  1.  Snoring?:  yes 2.  Tired?:  yes 3.  Observed apnea, stop breathing or choking/gasping during sleep?:  yes 4.  Pressure. HTN history?  yes 5.  BMI more than 35 kg/m2?  no 6.  Age more than 65 yrs?  yes 7.  Neck size larger than 17 in for female or 16 in for female?  no 8.  Gender = Female?  no  Total:  4  For general population  OSA - Low Risk : Yes to 0 - 2 questions OSA - Intermediate Risk : Yes to 3 - 4 questions OSA - High Risk : Yes to 5 - 8 questions  or Yes to 2 or more of 4 STOP questions + female gender or Yes to 2 or more of 4 STOP questions + BMI > 35kg/m2  or Yes to 2 or more of 4 STOP questions + neck circumference 17 inches / 43cm in female or 16 inches / 41cm in female  References: Rinaldo Cloud al. Anesthesiology 2008; 108: 812-821,  Gabriel Cirri et al Br Dara Hoyer 2012; 108: 035-465,   Gabriel Cirri et al J Clin Sleep Med Sept 2014.   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  Smoking history: 45 pack years - started at 82, 1ppd.   Social History   Occupational History  . Not on file  Tobacco Use  . Smoking status: Current Every Day Smoker    Packs/day: 1.00    Types: Cigarettes  . Smokeless tobacco: Never Used  . Tobacco comment: 4 cigarrettes/day  Substance and Sexual Activity  . Alcohol use: No  . Drug use: No  . Sexual activity: Not Currently    Relevant family history:  Family History  Problem Relation Age of Onset  . Cancer Mother   . Hypertension Mother   . Lung cancer Brother     Past Medical History:  Diagnosis Date  . COPD (chronic obstructive pulmonary disease) ([place])   . Diabetes mellitus without complication ([place])   . Hypertension   . Renal disorder    kidney stone    Past Surgical History:  Procedure Laterality Date  . TUBAL LIGATION       Review of systems:  Review of Systems  Constitutional: Positive for malaise/fatigue. Negative for chills, fever and weight loss.  HENT: Negative for congestion, sinus pain and sore throat.   Eyes: Negative for discharge and redness.  Respiratory: Positive for cough. Negative for hemoptysis, sputum production, shortness of breath and wheezing.   Cardiovascular: Negative for chest pain, palpitations and leg swelling.  Gastrointestinal: Negative for heartburn, nausea and vomiting.  Musculoskeletal: Negative for joint pain and myalgias.  Skin: Negative for rash.  Neurological: Negative for dizziness, tremors, focal weakness and headaches.  Endo/Heme/Allergies: Negative for environmental allergies.  Psychiatric/Behavioral: Positive for depression. Negative for suicidal ideas. The patient is not nervous/anxious.   All other systems reviewed and are negative.   Physical  Exam: Blood pressure 130/90, pulse 96, temperature 98.4 F (36.9 C), temperature source Temporal, height 5\' 5"  (1.651 m), weight 163 lb 6.4 oz (74.1 kg), SpO2 98 %. Gen:      No acute distress ENT:   no nasal polyps, mucus membranes moist Lungs:    Diminished, No increased respiratory effort, symmetric chest wall excursion, clear to auscultation bilaterally, no wheezes or crackles CV:         Regular rate and rhythm; no murmurs, rubs, or gallops.  No pedal edema Abd:      + bowel sounds; soft, non-tender; no distension MSK: no acute synovitis of DIP or PIP joints, no mechanics hands.  Skin:      Warm and dry; no rashes Neuro: normal speech, no focal facial asymmetry Psych: flat affect   Data Reviewed/Medical Decision Making:  Independent interpretation of tests: Imaging: . Review of patient's chest xray March 2021 images revealed bibasilar atelectasis. The patient's images have been independently reviewed by me.    PFTs: none  Labs:  Lab Results  Component Value Date   WBC 12.3 (H) 08/03/2019   HGB 14.2 08/03/2019   HCT 43.5 08/03/2019   MCV 96.9 08/03/2019   PLT 204 08/03/2019   Lab Results  Component Value Date   NA 138 08/03/2019   K 3.4 (L) 08/03/2019   CL 98 08/03/2019   CO2 28 08/03/2019     Immunization status:  Immunization History  Administered Date(s) Administered  . Influenza,inj,Quad PF,6+ Mos 04/14/2018, 04/24/2019    . I reviewed prior external note(s) from hospital stay March 2021 . I reviewed the result(s) of the labs and imaging as noted above.  . I have ordered spiro  Assessment:  COPD new diagnosis Tobacco use disorder Encounter for smoking cessation counseling Need for LDCT for lung cancer screening  Plan/Recommendations: Start Breo Continue Incruse PRN albuterol  I personally spent April 2021 counseling the patient regarding tobacco use disorder.  Patient is symptomatic from tobacco use disorder due to the following condition: COPD.  The  patient's response was contemplative.  We discussed nicotine replacement therapy, Wellbutrin, Chantix.  We identified to gather patient specific barriers to change.  The patient is open to future discussions about tobacco cessation.  She will follow up with PCP about depression management and therapy and we will revisit this conversation.  We discussed disease management and progression at length today for COPD  I have ordered spirometry for baseline lung function. May need OSA evaluation down the line. Follow up in lung cancer screening clinic.   Return to Care: Return in about 2 months (around 10/14/2019).  10/16/2019, MD Pulmonary and Critical Care Medicine [facility] Office:[phone]  CC: Durel Salts, MD

## 2019-08-16 ENCOUNTER — Ambulatory Visit: Payer: Managed Care, Other (non HMO) | Admitting: Family Medicine

## 2019-09-25 ENCOUNTER — Other Ambulatory Visit: Payer: Self-pay

## 2019-09-25 ENCOUNTER — Encounter: Payer: Self-pay | Admitting: Acute Care

## 2019-09-25 ENCOUNTER — Telehealth: Payer: Self-pay | Admitting: Acute Care

## 2019-09-25 ENCOUNTER — Encounter: Payer: Self-pay | Admitting: *Deleted

## 2019-09-25 ENCOUNTER — Ambulatory Visit (INDEPENDENT_AMBULATORY_CARE_PROVIDER_SITE_OTHER): Payer: Managed Care, Other (non HMO) | Admitting: Acute Care

## 2019-09-25 DIAGNOSIS — J441 Chronic obstructive pulmonary disease with (acute) exacerbation: Secondary | ICD-10-CM

## 2019-09-25 DIAGNOSIS — F17210 Nicotine dependence, cigarettes, uncomplicated: Secondary | ICD-10-CM

## 2019-09-25 MED ORDER — DOXYCYCLINE HYCLATE 100 MG PO TABS: 100.0000 mg | ORAL_TABLET | Freq: Two times a day (BID) | ORAL | 0 refills | Status: AC

## 2019-09-25 MED ORDER — PREDNISONE 10 MG PO TABS: ORAL_TABLET | ORAL | 0 refills | Status: DC

## 2019-09-25 NOTE — Telephone Encounter (Signed)
Good Morning. I saw this patient as an acute this morning. She needs a follow up appointment with me 5/10/at 10:30 am, if you could add that to my schedule I would appreciate it. I already discussed it with the patient so she is aware.  Also, can you please give her a note for out of work today. She will come to the office to pick it up. Just use my stamp or sign as a verbal order from me.   Thanks so much

## 2019-09-25 NOTE — Progress Notes (Signed)
Virtual Visit via Telephone Note  I connected with Marissa Owens on 09/25/19 at 10:30 AM EDT by telephone and verified that I am speaking with the correct person using two identifiers.  Location: Patient: At home Provider: Working virtually fom home   I discussed the limitations, risks, security and privacy concerns of performing an evaluation and management service by telephone and the availability of in person appointments. I also discussed with the patient that there may be a patient responsible charge related to this service. The patient expressed understanding and agreed to proceed.  Synopsis Marissa Owens is a 59 y.o. woman who presented with Progressing shortness of breath over the last 2-3 years. Recent hospital stay for COPD exacerbation. Discharged on prednisone.  She was seen by Dr. Shearon Stalls 08/14/2019  Recent COPD exacerbations 12/2018 06/2018 07/2018  3 COPD exacerbations in 2019  Has albuterol inhaler 3-4 times/day.  Duoneb 3-4 times/ay.  Incruse - once/day Breo added to Incruse 08/13/2019   History of Present Illness: Pt. Presents for an acute visit for shortness of breath. She is continuing to smoke. She is having a lot of shortness of breath and wheezing. She has recently done a [employer] treatment. She has been out of Incruse. She has not been using the Breo.She states she never got it filled. I spent 3 minutes explaining the difference between maintenance medications and rescue, and that the Breo and Incruse work together to offer her triple therapy for her COPD. She denies any fever, she states she is not coughing up secretions. Therefore she cannot tell if they are discolored.  She has been outside to go to work. She is still smoking.  She states she is smoking between 5 and 7 cigarettes daily.She states she feels like she did before she was last hospitalized. She is not taking lasix any more. She states she does not have any ankle swelling. She does endorse increasing  fatigue. She states she is unable to get the  spirometry ordered by Dr. Shearon Stalls as she cannot take 3 days off work. She denies chest pain, orthopnea or hemoptysis.  We discussed smoking cessation for 4 minutes We will discuss further at OV 5/10   Observations/Objective: CXR 08/02/2019 Heart is normal size. Decreasing lung volumes with increasing bibasilar atelectasis. No effusions. No acute bony abnormality.  IMPRESSION: Increasing bibasilar atelectasis.   Assessment and Plan: COPD Flare Plan We will send in a prescription for Doxycycline 100 mg. Take one tablet twice daily , one in the morning and one in the evening. We will send in a prescription for a prednisone taper Prednisone taper; 10 mg tablets: 4 tabs x 2 days, 3 tabs x 2 days, 2 tabs x 2 days 1 tab x 2 days then stop. Use your Incruse inhaler once daily without fail. Use you Breo inhaler once daily without fail. Rinse mouth after use Use your rescue inhaler and nebs as needed for breakthrough shortness of breath. Please use your incentive spirometer every hour while awake Please use your flutter valve 2-3 times a day. Continue Mucinex 1200 mg daily with a full glass of water. Consider addition of [facility] referral to Lung Cancer Screening program. Please do not smoke. We will review smoking cessation methods when I see you 10/02/2019 in the office for follow up. If you get worse not better please call the office or seek emergency care.  Follow Up Instructions: 10/02/2019 at 10:30 with Judson Roch NP  Will need smoking cessation appointment with pharmacy Will  need lung cancer screening   I discussed the assessment and treatment plan with the patient. The patient was provided an opportunity to ask questions and all were answered. The patient agreed with the plan and demonstrated an understanding of the instructions.   The patient was advised to call back or seek an in-person evaluation if the symptoms worsen or if  the condition fails to improve as anticipated.  I provided 40 minutes of non-face-to-face time during this encounter.   Bevelyn Ngo, NP 09/25/2019 11:03 AM

## 2019-09-25 NOTE — Telephone Encounter (Signed)
Pt came into the office and I gave her the letter for work. I made the appt as well for 10/02/2019 at 10:30 am. Nothing further is needed.

## 2019-09-25 NOTE — Patient Instructions (Signed)
It was good to talk with you today You appear to be having a flare of your COPD We will send in a prescription for Doxycycline 100 mg. Take one tablet twice daily , one in the morning and one in the evening. We will send in a prescription for a prednisone taper Prednisone taper; 10 mg tablets: 4 tabs x 2 days, 3 tabs x 2 days, 2 tabs x 2 days 1 tab x 2 days then stop. Use your Incruse inhaler once daily without fail. Use you Breo inhaler once daily without fail. Rinse mouth after use Use your rescue inhaler and nebs as needed for breakthrough shortness of breath. Please use your incentive spirometer every hour while awake Please use your flutter valve 2-3 times a day. Continue Mucinex 1200 mg daily with a full glass of water. Consider addition of [facility] referral to Lung Cancer Screening program. Please do not smoke. We will review smoking cessation methods when I see you 10/02/2019 in the office for follow up. If you get worse not better please call the office or seek emergency care. Note your daily symptoms > remember "red flags" for COPD:  Increase in cough, increase in sputum production, increase in shortness of breath or activity intolerance. If you notice these symptoms, please call to be seen.

## 2019-10-02 ENCOUNTER — Ambulatory Visit: Payer: Managed Care, Other (non HMO) | Admitting: Acute Care

## 2019-10-05 ENCOUNTER — Ambulatory Visit: Payer: Managed Care, Other (non HMO) | Attending: Internal Medicine

## 2019-10-05 DIAGNOSIS — Z23 Encounter for immunization: Secondary | ICD-10-CM

## 2019-10-05 NOTE — Progress Notes (Signed)
   Covid-19 Vaccination Clinic  Name:  Marissa Owens    MRN: [id number] DOB: 06/12/1960  10/05/2019  Ms. Marissa Owens was observed post Covid-19 immunization for 15 minutes without incident. She was provided with Vaccine Information Sheet and instruction to access the V-Safe system.   Ms. Marissa Owens was instructed to call 911 with any severe reactions post vaccine: Marland Kitchen Difficulty breathing  . Swelling of face and throat  . A fast heartbeat  . A bad rash all over body  . Dizziness and weakness   Immunizations Administered    Name Date Dose VIS Date Route   Pfizer COVID-19 Vaccine 10/05/2019  3:16 PM 0.3 mL 07/19/2018 Intramuscular   Manufacturer: [employer], [employer]   Lot: [id number]   NDC: [address]-3

## 2019-10-13 ENCOUNTER — Other Ambulatory Visit: Payer: Managed Care, Other (non HMO)

## 2019-10-13 ENCOUNTER — Ambulatory Visit: Payer: Managed Care, Other (non HMO) | Admitting: Internal Medicine

## 2019-10-18 ENCOUNTER — Ambulatory Visit: Payer: Managed Care, Other (non HMO) | Admitting: Acute Care

## 2019-12-01 ENCOUNTER — Telehealth: Payer: Self-pay | Admitting: Internal Medicine

## 2019-12-01 NOTE — Telephone Encounter (Signed)
FYI: Due to multiple unsuccessful attempts to contact pt to schedule for lung cancer screening, this referral has been cancelled.

## 2019-12-20 ENCOUNTER — Ambulatory Visit: Payer: Self-pay

## 2019-12-20 NOTE — Telephone Encounter (Signed)
Patient called stating that she has chest pain.  She states that it started yesterday.  Today she feels that it is getting worse. She states the pain is sharp on the left side and sometimes gives a numb sensation to her chest. She states her left arm feels heavy. She has COPS and states it is worse today. Per protocol patient will go to ER for evaluation.  She refused 911 call stating that she does not want it she is at work.  She agrees to let her work call for her..She refused my assistance again.  Reason for Disposition . SEVERE chest pain  Answer Assessment - Initial Assessment Questions 1. LOCATION: "Where does it hurt?"       Chest left side 2. RADIATION: "Does the pain go anywhere else?" (e.g., into neck, jaw, arms, back)     no 3. ONSET: "When did the chest pain begin?" (Minutes, hours or days)     yesterday 4. PATTERN "Does the pain come and go, or has it been constant since it started?"  "Does it get worse with exertion?"      Comes and goes 5. DURATION: "How long does it last" (e.g., seconds, minutes, hours)    hours 6. SEVERITY: "How bad is the pain?"  (e.g., Scale 1-10; mild, moderate, or severe)    - MILD (1-3): doesn't interfere with normal activities     - MODERATE (4-7): interferes with normal activities or awakens from sleep    - SEVERE (8-10): excruciating pain, unable to do any normal activities       8 7. CARDIAC RISK FACTORS: "Do you have any history of heart problems or risk factors for heart disease?" (e.g., angina, prior heart attack; diabetes, high blood pressure, high cholesterol, smoker, or strong family history of heart disease)     unsure 8. PULMONARY RISK FACTORS: "Do you have any history of lung disease?"  (e.g., blood clots in lung, asthma, emphysema, birth control pills)   COPD 9. CAUSE: "What do you think is causing the chest pain?"    unsure 10. OTHER SYMPTOMS: "Do you have any other symptoms?" (e.g., dizziness, nausea, vomiting, sweating, fever,  difficulty breathing, cough)      Sweaty, COPD worse today 11. PREGNANCY: "Is there any chance you are pregnant?" "When was your last menstrual period?"      N/A  Protocols used: CHEST PAIN-A-AH

## 2020-05-09 ENCOUNTER — Emergency Department (HOSPITAL_COMMUNITY)
Admission: EM | Admit: 2020-05-09 | Discharge: 2020-05-09 | Disposition: A | Discharge status: Home / Self Care | Payer: Managed Care, Other (non HMO) | Attending: Emergency Medicine | Admitting: Emergency Medicine

## 2020-05-09 ENCOUNTER — Other Ambulatory Visit: Payer: Self-pay

## 2020-05-09 ENCOUNTER — Emergency Department (HOSPITAL_COMMUNITY): Payer: Managed Care, Other (non HMO)

## 2020-05-09 DIAGNOSIS — J449 Chronic obstructive pulmonary disease, unspecified: Secondary | ICD-10-CM | POA: Insufficient documentation

## 2020-05-09 DIAGNOSIS — E119 Type 2 diabetes mellitus without complications: Secondary | ICD-10-CM | POA: Diagnosis not present

## 2020-05-09 DIAGNOSIS — Z7984 Long term (current) use of oral hypoglycemic drugs: Secondary | ICD-10-CM | POA: Insufficient documentation

## 2020-05-09 DIAGNOSIS — Z79899 Other long term (current) drug therapy: Secondary | ICD-10-CM | POA: Insufficient documentation

## 2020-05-09 DIAGNOSIS — F17210 Nicotine dependence, cigarettes, uncomplicated: Secondary | ICD-10-CM | POA: Diagnosis not present

## 2020-05-09 DIAGNOSIS — Z7951 Long term (current) use of inhaled steroids: Secondary | ICD-10-CM | POA: Diagnosis not present

## 2020-05-09 DIAGNOSIS — R079 Chest pain, unspecified: Secondary | ICD-10-CM | POA: Diagnosis present

## 2020-05-09 DIAGNOSIS — I10 Essential (primary) hypertension: Secondary | ICD-10-CM | POA: Diagnosis not present

## 2020-05-09 DIAGNOSIS — R0789 Other chest pain: Secondary | ICD-10-CM

## 2020-05-09 DIAGNOSIS — R0602 Shortness of breath: Secondary | ICD-10-CM | POA: Diagnosis not present

## 2020-05-09 LAB — BASIC METABOLIC PANEL
Anion gap: 9 (ref 5–15)
BUN: 11 mg/dL (ref 6–20)
CO2: 29 mmol/L (ref 22–32)
Calcium: 9.2 mg/dL (ref 8.9–10.3)
Chloride: 104 mmol/L (ref 98–111)
Creatinine, Ser: 0.82 mg/dL (ref 0.44–1.00)
GFR, Estimated: >60 mL/min (ref >60)
Glucose, Bld: 101 mg/dL — ABNORMAL HIGH (ref 70–99)
Potassium: 4 mmol/L (ref 3.5–5.1)
Sodium: 142 mmol/L (ref 135–145)

## 2020-05-09 LAB — CBC WITH DIFFERENTIAL/PLATELET
Abs Immature Granulocytes: 0.02 10*3/uL (ref 0.00–0.07)
Basophils Absolute: 0.1 10*3/uL (ref 0.0–0.1)
Basophils Relative: 1 %
Eosinophils Absolute: 0.2 10*3/uL (ref 0.0–0.5)
Eosinophils Relative: 4 %
HCT: 46.6 % — ABNORMAL HIGH (ref 36.0–46.0)
Hemoglobin: 15.5 g/dL — ABNORMAL HIGH (ref 12.0–15.0)
Immature Granulocytes: 0 %
Lymphocytes Relative: 47 %
Lymphs Abs: 3.2 10*3/uL (ref 0.7–4.0)
MCH: 32 pg (ref 26.0–34.0)
MCHC: 33.3 g/dL (ref 30.0–36.0)
MCV: 96.1 fL (ref 80.0–100.0)
Monocytes Absolute: 0.6 10*3/uL (ref 0.1–1.0)
Monocytes Relative: 9 %
Neutro Abs: 2.7 10*3/uL (ref 1.7–7.7)
Neutrophils Relative %: 39 %
Platelets: 204 10*3/uL (ref 150–400)
RBC: 4.85 MIL/uL (ref 3.87–5.11)
RDW: 12.7 % (ref 11.5–15.5)
WBC: 6.9 10*3/uL (ref 4.0–10.5)
nRBC: 0 % (ref 0.0–0.2)

## 2020-05-09 LAB — D-DIMER, QUANTITATIVE: D-Dimer, Quant: <0.27 ug/mL-FEU (ref 0.00–0.50)

## 2020-05-09 LAB — TROPONIN I (HIGH SENSITIVITY): Troponin I (High Sensitivity): 2 ng/L (ref <18)

## 2020-05-09 MED ORDER — IPRATROPIUM-ALBUTEROL 0.5-2.5 (3) MG/3ML IN SOLN
3.0000 mL | Freq: Once | RESPIRATORY_TRACT | Status: AC
  Administered 2020-05-09: 04:00:00 3 mL via RESPIRATORY_TRACT
  Filled 2020-05-09: qty 3

## 2020-05-09 MED ORDER — IBUPROFEN 200 MG PO TABS
600.0000 mg | ORAL_TABLET | Freq: Once | ORAL | Status: AC
  Administered 2020-05-09: 04:00:00 600 mg via ORAL
  Filled 2020-05-09: qty 3

## 2020-05-09 NOTE — Discharge Instructions (Signed)
You may alternate Tylenol 1000 mg every 6 hours as needed for pain and Ibuprofen 800 mg every 8 hours as needed for pain.  Please take Ibuprofen with food.  Do not take more than 4000 mg of Tylenol (acetaminophen) in a 24 hour period.  Your cardiac labs, EKG and chest x-ray today were reassuring.  You had a blood test that was normal that ruled out blood clots.  I suspect your pain is musculoskeletal in nature.

## 2020-05-09 NOTE — ED Provider Notes (Signed)
TIME SEEN: 3:12 AM  CHIEF COMPLAINT: Right-sided chest pain  HPI: Patient is a 59 year old female with history of hypertension, diabetes, COPD who presents to the emergency department with dull, achy right-sided chest pain without radiation that has been ongoing for the past 2 weeks.  Pain better with sitting upright and worse with bending over, deep inspiration.  No fevers.  Has chronic shortness of breath and cough from her COPD which is unchanged.  No nausea, vomiting, dizziness or diaphoresis.  No history of PE, DVT, exogenous estrogen use, recent fractures, surgery, trauma, hospitalization, prolonged travel or other immobilization. No lower extremity swelling or pain. No calf tenderness.  States she came to the emergency department tonight because the pain got worse.  ROS: See HPI Constitutional: no fever  Eyes: no drainage  ENT: no runny nose   Cardiovascular:  chest pain  Resp: chronic SOB  GI: no vomiting GU: no dysuria Integumentary: no rash  Allergy: no hives  Musculoskeletal: no leg swelling  Neurological: no slurred speech ROS otherwise negative  PAST MEDICAL HISTORY/PAST SURGICAL HISTORY:  Past Medical History:  Diagnosis Date  . COPD (chronic obstructive pulmonary disease) (HCC)   . Diabetes mellitus without complication (HCC)   . Hypertension   . Renal disorder    kidney stone    MEDICATIONS:  Prior to Admission medications   Medication Sig Start Date End Date Taking? Authorizing Provider  albuterol (PROVENTIL) (2.5 MG/3ML) 0.083% nebulizer solution Take 3 mLs (2.5 mg total) by nebulization every 6 (six) hours as needed for wheezing or shortness of breath. 08/14/19   Charlott Holler, MD  albuterol (VENTOLIN HFA) 108 (90 Base) MCG/ACT inhaler Inhale 2 puffs into the lungs every 6 (six) hours as needed for wheezing or shortness of breath. 08/03/19   Marguerita Merles Latif, DO  amLODipine (NORVASC) 10 MG tablet Take 1 tablet (10 mg total) by mouth daily. 08/10/19 11/08/19   Anders Simmonds, PA-C  FLUoxetine (PROZAC) 20 MG tablet Take 3 tablets (60 mg total) by mouth daily. 08/10/19   Anders Simmonds, PA-C  fluticasone furoate-vilanterol (BREO ELLIPTA) 200-25 MCG/INH AEPB Inhale 1 puff into the lungs daily. 08/14/19   Charlott Holler, MD  fluticasone furoate-vilanterol (BREO ELLIPTA) 200-25 MCG/INH AEPB Inhale 1 puff into the lungs daily. 08/14/19   Charlott Holler, MD  guaiFENesin (MUCINEX) 600 MG 12 hr tablet Take 1 tablet (600 mg total) by mouth 2 (two) times daily. 08/03/19   Marguerita Merles Latif, DO  Nebulizers (COMPRESSOR/NEBULIZER) MISC 1 machine Dx : COPD 07/13/17   Joseph Art, DO  nicotine (NICODERM CQ - DOSED IN MG/24 HOURS) 21 mg/24hr patch Place 1 patch (21 mg total) onto the skin daily. 08/04/19   Marguerita Merles Latif, DO  predniSONE (DELTASONE) 10 MG tablet Prednisone taper; 10 mg tablets: 4 tabs x 2 days, 3 tabs x 2 days, 2 tabs x 2 days 1 tab x 2 days then stop. 09/25/19   Bevelyn Ngo, NP  Respiratory Therapy Supplies (ADULT MASK) MISC Needs an mask for her nebulizer machine 07/03/19   Fulp, Cammie, MD  risperiDONE (RISPERDAL) 2 MG tablet Take 1 tablet (2 mg total) by mouth 2 (two) times daily. 01/10/19   Eustace Moore, MD  tiZANidine (ZANAFLEX) 4 MG tablet Take 1-2 tablets (4-8 mg total) by mouth every 6 (six) hours as needed for muscle spasms. 01/10/19   Eustace Moore, MD  umeclidinium bromide (INCRUSE ELLIPTA) 62.5 MCG/INH AEPB Inhale 1 puff into the lungs  daily. 08/03/19   Marguerita Merles Latif, DO  furosemide (LASIX) 20 MG tablet Take 1 tablet (20 mg total) by mouth daily for 7 days. Patient not taking: Reported on 01/10/2019 04/14/18 01/10/19  Anders Simmonds, PA-C  metFORMIN (GLUCOPHAGE) 500 MG tablet Take 1 tablet (500 mg total) by mouth daily with breakfast. Patient not taking: Reported on 01/10/2019 04/14/18 01/10/19  Anders Simmonds, PA-C    ALLERGIES:  No Known Allergies  SOCIAL HISTORY:  Social History   Tobacco Use  . Smoking  status: Current Every Day Smoker    Packs/day: 1.00    Types: Cigarettes  . Smokeless tobacco: Never Used  . Tobacco comment: 4 cigarrettes/day  Substance Use Topics  . Alcohol use: No    FAMILY HISTORY: Family History  Problem Relation Age of Onset  . Cancer Mother   . Hypertension Mother   . Lung cancer Brother     EXAM: BP (!) 134/98 (BP Location: Right Arm)   Pulse 71   Temp 98.1 F (36.7 C) (Oral)   Resp (!) 23   Ht 5\' 5"  (1.651 m)   Wt 70.8 kg   SpO2 99%   BMI 25.96 kg/m  CONSTITUTIONAL: Alert and oriented and responds appropriately to questions. Well-appearing; well-nourished HEAD: Normocephalic EYES: Conjunctivae clear, pupils appear equal, EOM appear intact ENT: normal nose; moist mucous membranes NECK: Supple, normal ROM CARD: RRR; S1 and S2 appreciated; no murmurs, no clicks, no rubs, no gallops CHEST: Tender to palpation over the right chest wall without crepitus, ecchymosis, deformity RESP: Normal chest excursion without splinting or tachypnea; breath sounds clear and equal bilaterally; no wheezes, no rhonchi, no rales, no hypoxia or respiratory distress, speaking full sentences, diminished aeration at bases bilaterally ABD/GI: Normal bowel sounds; non-distended; soft, non-tender, no rebound, no guarding, no peritoneal signs, no hepatosplenomegaly BACK:  The back appears normal EXT: Normal ROM in all joints; no deformity noted, no edema; no cyanosis, no calf tenderness or calf swelling SKIN: Normal color for age and race; warm; no rash on exposed skin NEURO: Moves all extremities equally PSYCH: The patient's mood and manner are appropriate.   MEDICAL DECISION MAKING: Patient here with atypical chest pain.  Low suspicion for ACS.  May be musculoskeletal in nature.  PE is on the differential.  Doubt pneumothorax.  Does not appear volume overloaded.  No infectious symptoms to suggest pneumonia.  Will obtain labs with one troponin and D-dimer.  EKG shows no new  ischemic abnormality.  Will give DuoNeb here for symptomatic relief as well.  ED PROGRESS: Patient's troponin is normal at 2.  D-dimer negative.  Hemoglobin 15.5.  Normal electrolytes.  Chest x-ray clear.  Suspect symptoms are musculoskeletal in nature.  Recommended alternating Tylenol and ibuprofen.  I feel she is safe for discharge.  She is also comfortable with this plan.  At this time, I do not feel there is any life-threatening condition present. I have reviewed, interpreted and discussed all results (EKG, imaging, lab, urine as appropriate) and exam findings with patient/family. I have reviewed nursing notes and appropriate previous records.  I feel the patient is safe to be discharged home without further emergent workup and can continue workup as an outpatient as needed. Discussed usual and customary return precautions. Patient/family verbalize understanding and are comfortable with this plan.  Outpatient follow-up has been provided as needed. All questions have been answered.    EKG Interpretation  Date/Time:  Thursday May 09 2020 02:16:37 EST Ventricular Rate:  75 PR  Interval:    QRS Duration: 90 QT Interval:  396 QTC Calculation: 443 R Axis:   70 Text Interpretation: Sinus rhythm Prolonged PR interval Confirmed by Rochele Raring [phone]) on 05/09/2020 3:05:09 AM         [name] was evaluated in Emergency Department on 05/09/2020 for the symptoms described in the history of present illness. She was evaluated in the context of the global COVID-19 pandemic, which necessitated consideration that the patient might be at risk for infection with the SARS-CoV-2 virus that causes COVID-19. Institutional protocols and algorithms that pertain to the evaluation of patients at risk for COVID-19 are in a state of rapid change based on information released by regulatory bodies including the CDC and federal and state organizations. These policies and algorithms were followed during the  patient's care in the ED.      Chace Klippel, Layla Maw, DO 05/09/20 [phone]

## 2020-05-09 NOTE — ED Triage Notes (Signed)
Pt c/o CP x 2 wks getting worse tonight, describes the pain as throbbing and worse laying down. Denies SOB or leg swelling, hx COPD

## 2020-06-04 ENCOUNTER — Other Ambulatory Visit: Payer: Self-pay

## 2020-06-04 ENCOUNTER — Ambulatory Visit (HOSPITAL_COMMUNITY)
Admission: EM | Admit: 2020-06-04 | Discharge: 2020-06-04 | Disposition: A | Discharge status: Home / Self Care | Payer: Managed Care, Other (non HMO) | Attending: Family Medicine | Admitting: Family Medicine

## 2020-06-04 ENCOUNTER — Encounter (HOSPITAL_COMMUNITY): Payer: Self-pay

## 2020-06-04 DIAGNOSIS — Z20822 Contact with and (suspected) exposure to covid-19: Secondary | ICD-10-CM | POA: Diagnosis not present

## 2020-06-04 DIAGNOSIS — J441 Chronic obstructive pulmonary disease with (acute) exacerbation: Secondary | ICD-10-CM | POA: Insufficient documentation

## 2020-06-04 DIAGNOSIS — J069 Acute upper respiratory infection, unspecified: Secondary | ICD-10-CM | POA: Diagnosis present

## 2020-06-04 LAB — SARS CORONAVIRUS 2 (TAT 6-24 HRS): SARS Coronavirus 2: NEGATIVE

## 2020-06-04 MED ORDER — PROMETHAZINE-DM 6.25-15 MG/5ML PO SYRP: 5.0000 mL | ORAL_SOLUTION | Freq: Four times a day (QID) | ORAL | 0 refills | Status: DC | PRN

## 2020-06-04 MED ORDER — INCRUSE ELLIPTA 62.5 MCG/INH IN AEPB: 1.0000 | INHALATION_SPRAY | Freq: Every day | RESPIRATORY_TRACT | 0 refills | Status: DC

## 2020-06-04 MED ORDER — PREDNISONE 20 MG PO TABS: 40.0000 mg | ORAL_TABLET | Freq: Every day | ORAL | 0 refills | Status: DC

## 2020-06-04 NOTE — ED Provider Notes (Signed)
[facility]    CSN: [id number] Arrival date & time: 06/04/20  8832      History   Chief Complaint Chief Complaint  Patient presents with  . Cough    X 1 week  . Nasal Congestion    X 1 week  . Headache    X 1 week    HPI Marissa Owens is a 60 y.o. female.   Here today with 1 week of cough, congestion, headaches, body aches, wheezing, and SOB. Denies known fever, abdominal pain, N/V/D, CP. 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 Hx of COPD on incruse and albuterol prn which have been helping but she needs a refill. Otherwise not taking anything for sxs at this time. Was COVID tested day 1 of sxs which was negative.      Past Medical History:  Diagnosis Date  . COPD (chronic obstructive pulmonary disease) (HCC)   . Diabetes mellitus without complication (HCC)   . Hypertension   . Renal disorder    kidney stone    Patient Active Problem List   Diagnosis Date Noted  . Acute respiratory failure (HCC) 07/13/2017  . Tobacco abuse 07/13/2017  . COPD exacerbation (HCC) 07/11/2017    Past Surgical History:  Procedure Laterality Date  . TUBAL LIGATION      OB History   No obstetric history on file.      Home Medications    Prior to Admission medications   Medication Sig Start Date End Date Taking? Authorizing Provider  albuterol (PROVENTIL) (2.5 MG/3ML) 0.083% nebulizer solution Take 3 mLs (2.5 mg total) by nebulization every 6 (six) hours as needed for wheezing or shortness of breath. 08/14/19  Yes Charlott Holler, MD  albuterol (VENTOLIN HFA) 108 (90 Base) MCG/ACT inhaler Inhale 2 puffs into the lungs every 6 (six) hours as needed for wheezing or shortness of breath. 08/03/19  Yes Sheikh, Omair Latif, DO  amLODipine (NORVASC) 10 MG tablet Take 1 tablet (10 mg total) by mouth daily. 08/10/19 11/08/19 Yes McClung, Marzella Schlein, PA-C  FLUoxetine (PROZAC) 20 MG tablet Take 3 tablets (60 mg total) by mouth daily. 08/10/19  Yes  McClung, Marzella Schlein, PA-C  guaiFENesin (MUCINEX) 600 MG 12 hr tablet Take 1 tablet (600 mg total) by mouth 2 (two) times daily. 08/03/19  Yes Sheikh, Omair [place], DO  Nebulizers (COMPRESSOR/NEBULIZER) MISC 1 machine Dx : COPD 07/13/17  Yes Joseph Art, DO  predniSONE (DELTASONE) 20 MG tablet Take 2 tablets (40 mg total) by mouth daily with breakfast. 06/04/20  Yes Particia Nearing, PA-C  promethazine-dextromethorphan (PROMETHAZINE-DM) 6.25-15 MG/5ML syrup Take 5 mLs by mouth 4 (four) times daily as needed for cough. 06/04/20  Yes Particia Nearing, PA-C  Respiratory Therapy Supplies (ADULT MASK) MISC Needs an mask for her nebulizer machine 07/03/19  Yes Fulp, Cammie, MD  risperiDONE (RISPERDAL) 2 MG tablet Take 1 tablet (2 mg total) by mouth 2 (two) times daily. 01/10/19  Yes Eustace Moore, MD  fluticasone furoate-vilanterol (BREO ELLIPTA) 200-25 MCG/INH AEPB Inhale 1 puff into the lungs daily. 08/14/19   Charlott Holler, MD  fluticasone furoate-vilanterol (BREO ELLIPTA) 200-25 MCG/INH AEPB Inhale 1 puff into the lungs daily. 08/14/19   Charlott Holler, MD  nicotine (NICODERM CQ - DOSED IN MG/24 HOURS) 21 mg/24hr patch Place 1 patch (21 mg total) onto the skin daily. 08/04/19   Sheikh, Kateri Mc Latif, DO  tiZANidine (ZANAFLEX) 4 MG tablet Take 1-2  tablets (4-8 mg total) by mouth every 6 (six) hours as needed for muscle spasms. 01/10/19   Eustace Moore, MD  umeclidinium bromide (INCRUSE ELLIPTA) 62.5 MCG/INH AEPB Inhale 1 puff into the lungs daily. 06/04/20   Particia Nearing, PA-C  furosemide (LASIX) 20 MG tablet Take 1 tablet (20 mg total) by mouth daily for 7 days. Patient not taking: Reported on 01/10/2019 04/14/18 01/10/19  Anders Simmonds, PA-C  metFORMIN (GLUCOPHAGE) 500 MG tablet Take 1 tablet (500 mg total) by mouth daily with breakfast. Patient not taking: Reported on 01/10/2019 04/14/18 01/10/19  Anders Simmonds, PA-C    Family History Family History  Problem Relation Age  of Onset  . Cancer Mother   . Hypertension Mother   . Lung cancer Brother     Social History Social History   Tobacco Use  . Smoking status: Current Every Day Smoker    Packs/day: 1.00    Types: Cigarettes  . Smokeless tobacco: Never Used  . Tobacco comment: 4 cigarrettes/day  Vaping Use  . Vaping Use: Never used  Substance Use Topics  . Alcohol use: No  . Drug use: No     Allergies   Patient has no known allergies.   Review of Systems Review of Systems PER HPI    Physical Exam Triage Vital Signs ED Triage Vitals  Enc Vitals Group     BP 06/04/20 0914 (!) 139/93     Pulse Rate 06/04/20 0914 74     Resp 06/04/20 0914 18     Temp 06/04/20 0914 98.4 F (36.9 C)     Temp Source 06/04/20 0914 Oral     SpO2 06/04/20 0914 98 %     Weight --      Height --      Head Circumference --      Peak Flow --      Pain Score 06/04/20 0915 8     Pain Loc --      Pain Edu? --      Excl. in GC? --    No data found.  Updated Vital Signs BP (!) 139/93 (BP Location: Right Arm)   Pulse 74   Temp 98.4 F (36.9 C) (Oral)   Resp 18   SpO2 98%   Visual Acuity Right Eye Distance:   Left Eye Distance:   Bilateral Distance:    Right Eye Near:   Left Eye Near:    Bilateral Near:     Physical Exam Vitals and nursing note reviewed.  Constitutional:      Appearance: Normal appearance. She is not ill-appearing.  HENT:     Head: Atraumatic.     Right Ear: Tympanic membrane normal.     Left Ear: Tympanic membrane normal.     Nose: Rhinorrhea present.     Mouth/Throat:     Mouth: Mucous membranes are moist.     Pharynx: Posterior oropharyngeal erythema present.  Eyes:     Extraocular Movements: Extraocular movements intact.     Conjunctiva/sclera: Conjunctivae normal.  Cardiovascular:     Rate and Rhythm: Normal rate and regular rhythm.     Heart sounds: Normal heart sounds.  Pulmonary:     Effort: Pulmonary effort is normal. No respiratory distress.     Breath  sounds: Wheezing present. No rales.  Abdominal:     General: Bowel sounds are normal. There is no distension.     Palpations: Abdomen is soft.     Tenderness: There is  no abdominal tenderness. There is no guarding.  Musculoskeletal:        General: Normal range of motion.     Cervical back: Normal range of motion and neck supple.  Skin:    General: Skin is warm and dry.  Neurological:     Mental Status: She is alert and oriented to person, place, and time.  Psychiatric:        Mood and Affect: Mood normal.        Thought Content: Thought content normal.        Judgment: Judgment normal.      UC Treatments / Results  Labs (all labs ordered are listed, but only abnormal results are displayed) Labs Reviewed  SARS CORONAVIRUS 2 (TAT 6-24 HRS)    EKG   Radiology No results found.  Procedures Procedures (including critical care time)  Medications Ordered in UC Medications - No data to display  Initial Impression / Assessment and Plan / UC Course  I have reviewed the triage vital signs and the nursing notes.  Pertinent labs & imaging results that were available during my care of the patient were reviewed by me and considered in my medical decision making (see chart for details).     Vital signs reassuring, exam stable with wheezes. Will retest for COVID which is pending, refill incruse as requested by patient and start on prednisone burst, phenergan DM. Discussed otc remedies and supportive care, strict ED precautions for worsening sxs.  Work note given.   Final Clinical Impressions(s) / UC Diagnoses   Final diagnoses:  Viral URI with cough  COPD exacerbation [facility])   Discharge Instructions   None    ED Prescriptions    Medication Sig Dispense Auth. Provider   umeclidinium bromide (INCRUSE ELLIPTA) 62.5 MCG/INH AEPB Inhale 1 puff into the lungs daily. 30 each Particia Nearing, PA-C   predniSONE (DELTASONE) 20 MG tablet Take 2 tablets (40 mg total) by mouth  daily with breakfast. 10 tablet Particia Nearing, PA-C   promethazine-dextromethorphan (PROMETHAZINE-DM) 6.25-15 MG/5ML syrup Take 5 mLs by mouth 4 (four) times daily as needed for cough. 100 mL Particia Nearing, [place]     PDMP not reviewed this encounter.   Particia Nearing, [place] 06/04/20 1105

## 2020-06-04 NOTE — ED Triage Notes (Signed)
Patient states she has had a cough and congestion as well as headaches and body aches x 1 week. Pt states she has chest congestion. Pt is speaking in full sentences and is oxygen sat of 98 percent. Pt is stable and can wait in the lobby after triage for a room. Pt is aox4 and ambulatory.

## 2020-06-19 ENCOUNTER — Telehealth: Payer: Self-pay | Admitting: Acute Care

## 2020-06-19 ENCOUNTER — Ambulatory Visit: Payer: Managed Care, Other (non HMO)

## 2020-06-19 DIAGNOSIS — R059 Cough, unspecified: Secondary | ICD-10-CM

## 2020-06-19 NOTE — Telephone Encounter (Signed)
See other phone note from today.

## 2020-06-19 NOTE — Telephone Encounter (Signed)
She would need a tele visit minimally prior to the 2/2 visit, or we need to get her into the office before 2/2 if possible.She needs a CXR Thanks

## 2020-06-19 NOTE — Addendum Note (Signed)
Addended by: Marcellus Scott on: 06/19/2020 12:35 PM   Modules accepted: Orders

## 2020-06-19 NOTE — Telephone Encounter (Signed)
I called the pt back and spoke with her and offered her appt today with ND.  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  I put her on with ND at 945 and told her to come in early for cxr prior to her appt.  Order placed for the cxr.  Pt voiced her understanding . Nothing further is needed.

## 2020-06-19 NOTE — Telephone Encounter (Signed)
Called and spoke with pt and she stated that she is having a cough that is worse x 1 month.  She is using mucinex but she said that this is not breaking anything up.  She is requesting a cough med be called in for her.    Pt stated that her [facility] is worse as well and she this is worse with exertion.  She stated that she feels that at times she may need oxygen. She stated that her chest hurts from the cough and she is requesting documentation to show her work that she is having these issues.  She has scheduled an OV on 2/2 with SG.  SG please advise. Thanks

## 2020-06-19 NOTE — Telephone Encounter (Signed)
Called the pt and there was no answerCeline Owens with openings this pm so needs appt with cxr- LMTCB.

## 2020-06-20 ENCOUNTER — Encounter: Payer: Self-pay | Admitting: *Deleted

## 2020-06-20 ENCOUNTER — Other Ambulatory Visit: Payer: Self-pay | Admitting: Family Medicine

## 2020-06-20 ENCOUNTER — Other Ambulatory Visit: Payer: Self-pay

## 2020-06-20 ENCOUNTER — Ambulatory Visit (INDEPENDENT_AMBULATORY_CARE_PROVIDER_SITE_OTHER): Payer: Managed Care, Other (non HMO) | Admitting: Internal Medicine

## 2020-06-20 ENCOUNTER — Ambulatory Visit (INDEPENDENT_AMBULATORY_CARE_PROVIDER_SITE_OTHER): Payer: Managed Care, Other (non HMO)

## 2020-06-20 ENCOUNTER — Encounter: Payer: Self-pay | Admitting: Internal Medicine

## 2020-06-20 DIAGNOSIS — F17210 Nicotine dependence, cigarettes, uncomplicated: Secondary | ICD-10-CM | POA: Diagnosis not present

## 2020-06-20 DIAGNOSIS — J441 Chronic obstructive pulmonary disease with (acute) exacerbation: Secondary | ICD-10-CM

## 2020-06-20 DIAGNOSIS — R059 Cough, unspecified: Secondary | ICD-10-CM

## 2020-06-20 DIAGNOSIS — Z720 Tobacco use: Secondary | ICD-10-CM

## 2020-06-20 MED ORDER — ALBUTEROL SULFATE (2.5 MG/3ML) 0.083% IN NEBU: 2.5000 mg | INHALATION_SOLUTION | Freq: Four times a day (QID) | RESPIRATORY_TRACT | 12 refills | Status: DC | PRN

## 2020-06-20 NOTE — Telephone Encounter (Signed)
Medication:  amLODipine (NORVASC) 10 MG tablet [[id number]]  metFORMIN (GLUCOPHAGE) 500 MG tablet [[id number]]  predniSONE (DELTASONE) 20 MG tablet [[id number]]  Has the patient contacted their pharmacy? Yes   (Agent: If yes, when and what did the pharmacy advise?) to call the office   Preferred Pharmacy (with phone number or street name):  [facility] [address] ([address]), [place] - [address]  [address] [place]) [place] [address]  Phone:  [phone] Fax:  [phone]  Agent: Please be advised that RX refills may take up to 3 business days. We ask that you follow-up with your pharmacy.

## 2020-06-20 NOTE — Patient Instructions (Addendum)
The patient should have follow up scheduled with myself in 3 months.   Prior to next visit patient should have: Full set of PFTs  I am sending you a nebulizer machine for home.   You can take robitussin over the counter for the cough.   What are the benefits of quitting smoking? Quitting smoking can lower your chances of getting or dying from heart disease, lung disease, kidney failure, infection, or cancer. It can also lower your chances of getting osteoporosis, a condition that makes your bones weak. Plus, quitting smoking can help your skin look younger and reduce the chances that you will have problems with sex.  Quitting smoking will improve your health no matter how old you are, and no matter how long or how much you have smoked.  What should I do if I want to quit smoking? The letters in the word "START" can help you remember the steps to take: S = Set a quit date. T = Tell family, friends, and the people around you that you plan to quit. A = Anticipate or plan ahead for the tough times you'll face while quitting. R = Remove cigarettes and other tobacco products from your home, car, and work. T = Talk to your doctor about getting help to quit.  How can my doctor or nurse help? Your doctor or nurse can give you advice on the best way to quit. He or she can also put you in touch with counselors or other people you can call for support. Plus, your doctor or nurse can give you medicines to: ?Reduce your craving for cigarettes ?Reduce the unpleasant symptoms that happen when you stop smoking (called "withdrawal symptoms"). You can also get help from a free phone line (1-800-QUIT-NOW) or go online to [employer].  What are the symptoms of withdrawal? The symptoms include: ?Trouble sleeping ?Being irritable, anxious or restless ?Getting frustrated or angry ?Having trouble thinking clearly  Some people who stop smoking become temporarily depressed. Some people need treatment  for depression, such as counseling or antidepressant medicines. Depressed people might: ?No longer enjoy or care about doing the things they used to like to do ?Feel sad, down, hopeless, nervous, or cranky most of the day, almost every day ?Lose or gain weight ?Sleep too much or too little ?Feel tired or like they have no energy ?Feel guilty or like they are worth nothing ?Forget things or feel confused ?Move and speak more slowly than usual ?Act restless or have trouble staying still ?Think about death or suicide  If you think you might be depressed, see your doctor or nurse. Only someone trained in mental health can tell for sure if you are depressed. If you ever feel like you might hurt yourself, go straight to the nearest emergency department. Or you can call for an ambulance (in the [place] and [place], dial 9-1-1) or call your doctor or nurse right away and tell them it is an emergency. You can also reach the [place] National Suicide Prevention Lifeline at (838) 746-3221 or http://hill.com/.  How do medicines help you stop smoking? Different medicines work in different ways: ?Nicotine replacement therapy eases withdrawal and reduces your body's craving for nicotine, the main drug found in cigarettes. There are different forms of nicotine replacement, including skin patches, lozenges, gum, nasal sprays, and "puffers" or inhalers. Many can be bought without a prescription, while others might require one. ?Bupropion is a prescription medicine that reduces your desire to smoke. This medicine is sold under the  brand names Zyban and Wellbutrin. It is also available in a generic version, which is cheaper than brand name medicines. ?Varenicline (brand names: Chantix, Champix) is a prescription medicine that reduces withdrawal symptoms and cigarette cravings. If you think you'd like to take varenicline and you have a history of depression, anxiety, or heart disease, discuss this with your  doctor or nurse before taking the medicine. Varenicline can also increase the effects of alcohol in some people. It's a good idea to limit drinking while you're taking it, at least until you know how it affects you.  How does counseling work? Counseling can happen during formal office visits or just over the phone. A counselor can help you: ?Figure out what triggers your smoking and what to do instead ?Overcome cravings ?Figure out what went wrong when you tried to quit before  What works best? Studies show that people have the best luck at quitting if they take medicines to help them quit and work with a Veterinary surgeon. It might also be helpful to combine nicotine replacement with one of the prescription medicines that help people quit. In some cases, it might even make sense to take bupropion and varenicline together.  What about e-cigarettes? Sometimes people wonder if using electronic cigarettes, or "e-cigarettes," might help them quit smoking. Using e-cigarettes is also called "vaping." Doctors do not recommend e-cigarettes in place of medicines and counseling. That's because e-cigarettes still contain nicotine as well as other substances that might be harmful. It's not clear how they can affect a person's health in the long term.  Will I gain weight if I quit? Yes, you might gain a few pounds. But quitting smoking will have a much more positive effect on your health than weighing a few pounds more. Plus, you can help prevent some weight gain by being more active and eating less. Taking the medicine bupropion might help control weight gain.   What else can I do to improve my chances of quitting? You can: ?Start exercising. ?Stay away from smokers and places that you associate with smoking. If people close to you smoke, ask them to quit with you. ?Keep gum, hard candy, or something to put in your mouth handy. If you get a craving for a cigarette, try one of these instead. ?Don't give up, even if  you start smoking again. It takes most people a few tries before they succeed.  What if I am pregnant and I smoke? If you are pregnant, it's really important for the health of your baby that you quit. Ask your doctor what options you have, and what is safest for your baby

## 2020-06-20 NOTE — Progress Notes (Signed)
[name]    [id number]    09/28/1960  Primary Care Physician:Fulp, Hewitt Shorts, MD (Inactive) Date of Appointment: 06/20/2020 Established Patient Visit  Chief complaint:   Chief Complaint  Patient presents with  . Acute Visit    Chest xray performed today. Pt states she has had increased SOB x1 month and also has complaints of cough. Pt also has complaints of wheezing.     HPI: Marissa Owens is a 60 y.o. woman with COPD and ongoing tobacco use disorder.  Interval Updates: Here for sick visit today. Has been covid negative x 2. She has had multiple COPD exacerbations requiring prednisone, one even requiring hospitalization. Most recently she went to urgent care Jan 11th and was treated with prednisone and abx. She had run out of incruse and albuterol at that time. She thinks the incruse helps her a lot, but is too expensive. The albuterol she feels she needs to take every 30 minutes. Prednisone helped but she is done with it now. She is still smoking 1/2 ppd. She has a dry cough that bothers her the most.  I have reviewed the patient's family social and past medical history and updated as appropriate.   Past Medical History:  Diagnosis Date  . COPD (chronic obstructive pulmonary disease) (HCC)   . Diabetes mellitus without complication (HCC)   . Hypertension   . Renal disorder    kidney stone    Past Surgical History:  Procedure Laterality Date  . TUBAL LIGATION      Family History  Problem Relation Age of Onset  . Cancer Mother   . Hypertension Mother   . Lung cancer Brother     Social History   Occupational History  . Not on file  Tobacco Use  . Smoking status: Current Every Day Smoker    Packs/day: 1.00    Types: Cigarettes  . Smokeless tobacco: Never Used  . Tobacco comment: 30  Vaping Use  . Vaping Use: Never used  Substance and Sexual Activity  . Alcohol use: No  . Drug use: No  . Sexual activity: Not Currently     Physical  Exam: Blood pressure 128/80, pulse 84, height 5\' 2"  (1.575 m), weight 167 lb (75.8 kg), SpO2 97 %.  Gen:      No acute distress, hoarse voice Lungs:    No increased respiratory effort, symmetric chest wall excursion, clear to auscultation bilaterally, no wheezes or crackles CV:         Regular rate and rhythm; no murmurs, rubs, or gallops.  No pedal edema   Data Reviewed: Imaging: I have personally reviewed the chest xray obtained today which shows chronic interstitial changes from COPD, no focal consolidation.   PFTs: None on file  Labs:  Immunization status: Immunization History  Administered Date(s) Administered  . Influenza,inj,Quad PF,6+ Mos 04/14/2018, 04/24/2019, 04/24/2020  . PFIZER(Purple Top)SARS-COV-2 Vaccination 09/16/2019, 10/05/2019    Assessment:  COPD, chronic bronchitis, progressing with acute exacerbation Tobacco use disorder Encounter for smoking cessation counseling Need for LDCT for lung cancer screening  Plan/Recommendations: Continue Incruse PRN albuterol Will prescribe nebulizer machine She is still recovering from acute exacerbation but I don't think additional steroids would be helpful at this time due to absence of wheezing. Start albuterol nebs at home.   She is unable to spend time out of work to get covid tested for PFTs currently. Will hold off for now.   At some point she needs  LDCT for screening but hasn't been in contact to make appointment.   I personally spent 3 minutes counseling the patient regarding tobacco use disorder.  Patient is symptomatic from tobacco use disorder due to the following condition: COPD.  The patient's response was pre-contemplative.  We discussed nicotine replacement therapy, Wellbutrin.  We identified to gather patient specific barriers to change.  The patient is open to future discussions about tobacco cessation.   Return to Care: Return in about 3 months (around 09/18/2020).   Durel Salts, MD Pulmonary and  Critical Care Medicine [facility] Office:[phone]

## 2020-06-20 NOTE — Telephone Encounter (Signed)
Requested medication (s) are due for refill today: yes  Requested medication (s) are on the active medication list: yes  Last refill:  prednisone 06/04/20 by UC, Amlodipine historic, metformin historic  Future visit scheduled: tes  Notes to clinic:  Historic medication and provider.     Requested Prescriptions  Pending Prescriptions Disp Refills   predniSONE (DELTASONE) 20 MG tablet 10 tablet 0    Sig: Take 2 tablets (40 mg total) by mouth daily with breakfast.      Not Delegated - Endocrinology:  Oral Corticosteroids Failed - 06/20/2020 10:28 AM      Failed - This refill cannot be delegated      Failed - Valid encounter within last 6 months    Recent Outpatient Visits           10 months ago Acute respiratory failure with hypoxia [facility])   [facility], [place], [place]   11 months ago Acute right lower quadrant pain   [place], [place]   2 years ago Type 2 diabetes mellitus with hyperglycemia, without long-term current use of insulin ([place])   [place], [place], [place]       Future Appointments             In 1 week Charlott Rakes, MD [place] - Last BP in normal range    BP Readings from Last 1 Encounters:  06/20/20 128/80            metFORMIN (GLUCOPHAGE) 500 MG tablet      Sig: Take by mouth 2 (two) times daily with a meal.      Endocrinology:  Diabetes - Biguanides Failed - 06/20/2020 10:28 AM      Failed - HBA1C is between 0 and 7.9 and within 180 days    HbA1c, POC (prediabetic range)  Date Value Ref Range Status  04/14/2018 5.7 5.7 - 6.4 % Final   Hgb A1c MFr Bld  Date Value Ref Range Status  07/31/2019 6.0 (H) 4.8 - 5.6 % Final    Comment:    (NOTE) Pre diabetes:          5.7%-6.4% Diabetes:              >6.4% Glycemic control for   <7.0% adults with diabetes            Failed - Valid encounter within last 6 months    Recent Outpatient Visits           10 months ago Acute respiratory failure with hypoxia [facility])   [place], [place], [place]   11 months ago Acute right lower quadrant pain   [place], [place], [place]   2 years ago Type 2 diabetes mellitus with hyperglycemia, without long-term current use of insulin [facility])   [place], [place], [place]       Future Appointments             In 1 week Charlott Rakes, MD [place] - Cr in normal range and within 360 days    Creatinine, Ser  Date Value Ref Range Status  05/09/2020 0.82  0.44 - 1.00 mg/dL Final          Passed - AA eGFR in normal range and within 360 days    GFR calc Af Amer  Date Value Ref Range Status  08/03/2019 >60 >60 mL/min Final   GFR, Estimated  Date Value Ref Range Status  05/09/2020 >60 >60 mL/min Final    Comment:    (NOTE) Calculated using the CKD-EPI Creatinine Equation (2021)             amLODipine (NORVASC) 10 MG tablet      Sig: Take 1 tablet (10 mg total) by mouth daily.      Cardiovascular:  Calcium Channel Blockers Failed - 06/20/2020 10:28 AM      Failed - Valid encounter within last 6 months    Recent Outpatient Visits           10 months ago Acute respiratory failure with hypoxia [facility])   [place], [place], [place]   11 months ago Acute right lower quadrant pain   [place], [place]   2 years ago Type 2 diabetes mellitus with hyperglycemia, without long-term current use of insulin [facility])   [place], [place], [place]       Future Appointments             In 1 week Charlott Rakes, MD [place] BP in  normal range    BP Readings from Last 1 Encounters:  06/20/20 128/80

## 2020-06-21 MED ORDER — AMLODIPINE BESYLATE 10 MG PO TABS: 10.0000 mg | ORAL_TABLET | Freq: Every day | ORAL | 0 refills | Status: DC

## 2020-06-21 MED ORDER — METFORMIN HCL 500 MG PO TABS: 500.0000 mg | ORAL_TABLET | Freq: Two times a day (BID) | ORAL | 0 refills | Status: DC

## 2020-06-24 ENCOUNTER — Other Ambulatory Visit: Payer: Self-pay

## 2020-06-24 ENCOUNTER — Emergency Department (HOSPITAL_COMMUNITY)
Admission: EM | Admit: 2020-06-24 | Discharge: 2020-06-24 | Disposition: A | Discharge status: Home / Self Care | Payer: Managed Care, Other (non HMO) | Attending: Emergency Medicine | Admitting: Emergency Medicine

## 2020-06-24 DIAGNOSIS — Z7951 Long term (current) use of inhaled steroids: Secondary | ICD-10-CM | POA: Diagnosis not present

## 2020-06-24 DIAGNOSIS — I10 Essential (primary) hypertension: Secondary | ICD-10-CM | POA: Diagnosis not present

## 2020-06-24 DIAGNOSIS — F17210 Nicotine dependence, cigarettes, uncomplicated: Secondary | ICD-10-CM | POA: Diagnosis not present

## 2020-06-24 DIAGNOSIS — Z7984 Long term (current) use of oral hypoglycemic drugs: Secondary | ICD-10-CM | POA: Insufficient documentation

## 2020-06-24 DIAGNOSIS — J449 Chronic obstructive pulmonary disease, unspecified: Secondary | ICD-10-CM | POA: Insufficient documentation

## 2020-06-24 DIAGNOSIS — M545 Low back pain, unspecified: Secondary | ICD-10-CM

## 2020-06-24 DIAGNOSIS — E119 Type 2 diabetes mellitus without complications: Secondary | ICD-10-CM | POA: Diagnosis not present

## 2020-06-24 DIAGNOSIS — Z79899 Other long term (current) drug therapy: Secondary | ICD-10-CM | POA: Insufficient documentation

## 2020-06-24 MED ORDER — TRAMADOL HCL 50 MG PO TABS: 50.0000 mg | ORAL_TABLET | Freq: Four times a day (QID) | ORAL | 0 refills | Status: DC | PRN

## 2020-06-24 MED ORDER — KETOROLAC TROMETHAMINE 15 MG/ML IJ SOLN
15.0000 mg | Freq: Once | INTRAMUSCULAR | Status: AC
  Administered 2020-06-24: 15 mg via INTRAMUSCULAR
  Filled 2020-06-24: qty 1

## 2020-06-24 MED ORDER — ALBUTEROL SULFATE HFA 108 (90 BASE) MCG/ACT IN AERS
2.0000 | INHALATION_SPRAY | Freq: Once | RESPIRATORY_TRACT | Status: AC
  Administered 2020-06-24: 2 via RESPIRATORY_TRACT
  Filled 2020-06-24: qty 6.7

## 2020-06-24 NOTE — ED Notes (Signed)
Reviewed discharge instructions with patient. Follow-up care and medications reviewed. Patient  verbalized understanding. Patient A&Ox4, VSS, and ambulatory with steady gait upon discharge.  °

## 2020-06-24 NOTE — ED Triage Notes (Signed)
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 Taking OTC meds without relief.

## 2020-06-24 NOTE — ED Provider Notes (Signed)
[facility] EMERGENCY DEPARTMENT Provider Note   CSN: [id number] Arrival date & time: 06/24/20  1655     History Chief Complaint  Patient presents with  . Back Pain    Marissa Owens is a 60 y.o. female.  60 year old female with prior medical history detailed below presents for evaluation of low back pain.  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  She complains of increased pain with standing or with lifting.  She reports taking ibuprofen and tramadol at home with some improvement in her symptoms.  She denies lower extremity weakness.  She denies fever.  She denies difficulty with urination or with bowel movement.  The history is provided by the patient and medical records.  Back Pain Location:  Lumbar spine Quality:  Aching Radiates to:  Does not radiate Pain severity:  Mild Pain is:  Worse during the night Onset quality:  Gradual Duration:  5 days Timing:  Constant Progression:  Waxing and waning Chronicity:  New Context: lifting heavy objects        Past Medical History:  Diagnosis Date  . COPD (chronic obstructive pulmonary disease) (HCC)   . Diabetes mellitus without complication (HCC)   . Hypertension   . Renal disorder    kidney stone    Patient Active Problem List   Diagnosis Date Noted  . Acute respiratory failure (HCC) 07/13/2017  . Tobacco abuse 07/13/2017  . COPD exacerbation (HCC) 07/11/2017    Past Surgical History:  Procedure Laterality Date  . TUBAL LIGATION       OB History   No obstetric history on file.     Family History  Problem Relation Age of Onset  . Cancer Mother   . Hypertension Mother   . Lung cancer Brother     Social History   Tobacco Use  . Smoking status: Current Every Day Smoker    Packs/day: 1.00    Types: Cigarettes  . Smokeless tobacco: Never Used  . Tobacco comment: 30  Vaping Use  . Vaping Use:  Never used  Substance Use Topics  . Alcohol use: No  . Drug use: No    Home Medications Prior to Admission medications   Medication Sig Start Date End Date Taking? Authorizing Provider  traMADol (ULTRAM) 50 MG tablet Take 1 tablet (50 mg total) by mouth every 6 (six) hours as needed. 06/24/20  Yes Wynetta Fines, MD  albuterol (PROVENTIL) (2.5 MG/3ML) 0.083% nebulizer solution Take 3 mLs (2.5 mg total) by nebulization every 6 (six) hours as needed for wheezing or shortness of breath. 08/14/19   Charlott Holler, MD  albuterol (PROVENTIL) (2.5 MG/3ML) 0.083% nebulizer solution Take 3 mLs (2.5 mg total) by nebulization every 6 (six) hours as needed for wheezing or shortness of breath. 06/20/20   Charlott Holler, MD  albuterol (VENTOLIN HFA) 108 (90 Base) MCG/ACT inhaler Inhale 2 puffs into the lungs every 6 (six) hours as needed for wheezing or shortness of breath. 08/03/19   Marguerita Merles Latif, DO  amLODipine (NORVASC) 10 MG tablet Take 1 tablet (10 mg total) by mouth daily. 06/21/20   Hoy Register, MD  FLUoxetine (PROZAC) 20 MG tablet Take 3 tablets (60 mg total) by mouth daily. 08/10/19   Anders Simmonds, PA-C  fluticasone furoate-vilanterol (BREO ELLIPTA) 200-25 MCG/INH AEPB Inhale 1 puff into the lungs daily. 08/14/19   Charlott Holler, MD  guaiFENesin (MUCINEX) 600 MG 12 hr tablet Take 1 tablet (600 mg total) by mouth 2 (two) times daily. 08/03/19   Marguerita Merles Latif, DO  metFORMIN (GLUCOPHAGE) 500 MG tablet Take 1 tablet (500 mg total) by mouth 2 (two) times daily with a meal. 06/21/20   Hoy Register, MD  Nebulizers (COMPRESSOR/NEBULIZER) MISC 1 machine Dx : COPD 07/13/17   Joseph Art, DO  predniSONE (DELTASONE) 20 MG tablet Take 2 tablets (40 mg total) by mouth daily with breakfast. 06/04/20   Particia Nearing, PA-C  promethazine-dextromethorphan (PROMETHAZINE-DM) 6.25-15 MG/5ML syrup Take 5 mLs by mouth 4 (four) times daily as needed for cough. 06/04/20   Particia Nearing, PA-C  Respiratory Therapy Supplies (ADULT MASK) MISC Needs an mask for her nebulizer machine 07/03/19   Fulp, Cammie, MD  risperiDONE (RISPERDAL) 2 MG tablet Take 1 tablet (2 mg total) by mouth 2 (two) times daily. 01/10/19   Eustace Moore, MD  tiZANidine (ZANAFLEX) 4 MG tablet Take 1-2 tablets (4-8 mg total) by mouth every 6 (six) hours as needed for muscle spasms. 01/10/19   Eustace Moore, MD  furosemide (LASIX) 20 MG tablet Take 1 tablet (20 mg total) by mouth daily for 7 days. Patient not taking: Reported on 01/10/2019 04/14/18 01/10/19  Anders Simmonds, PA-C    Allergies    Patient has no known allergies.  Review of Systems   Review of Systems  Musculoskeletal: Positive for back pain.  All other systems reviewed and are negative.   Physical Exam Updated Vital Signs BP 138/81   Pulse 97   Temp 98.5 F (36.9 C) (Oral)   Resp (!) 21   Ht 5\' 2"  (1.575 m)   Wt 75.8 kg   SpO2 92%   BMI 30.54 kg/m   Physical Exam Vitals and nursing note reviewed.  Constitutional:      General: She is not in acute distress.    Appearance: Normal appearance. She is well-developed and well-nourished.  HENT:     Head: Normocephalic and atraumatic.     Mouth/Throat:     Mouth: Oropharynx is clear and moist.  Eyes:     Extraocular Movements: EOM normal.     Conjunctiva/sclera: Conjunctivae normal.     Pupils: Pupils are equal, round, and reactive to light.  Cardiovascular:     Rate and Rhythm: Normal rate and regular rhythm.     Heart sounds: Normal heart sounds.  Pulmonary:     Effort: Pulmonary effort is normal. No respiratory distress.     Breath sounds: Normal breath sounds.  Abdominal:     General: There is no distension.     Palpations: Abdomen is soft.     Tenderness: There is no abdominal tenderness.  Musculoskeletal:        General: No deformity or edema. Normal range of motion.     Cervical back: Normal range of motion and neck supple.  Skin:    General:  Skin is warm and dry.  Neurological:     Mental Status: She is alert and oriented to person, place, and time.  Psychiatric:        Mood and Affect: Mood and affect normal.     ED Results / Procedures / Treatments   Labs (all labs ordered are listed, but only abnormal results are displayed) Labs Reviewed - No data to display  EKG None  Radiology No results found.  Procedures Procedures   Medications Ordered in ED Medications  ketorolac (  TORADOL) 15 MG/ML injection 15 mg (15 mg Intramuscular Given 06/24/20 2146)  albuterol (VENTOLIN HFA) 108 (90 Base) MCG/ACT inhaler 2 puff (2 puffs Inhalation Given 06/24/20 2147)    ED Course  I have reviewed the triage vital signs and the nursing notes.  Pertinent labs & imaging results that were available during my care of the patient were reviewed by me and considered in my medical decision making (see chart for details).    MDM Rules/Calculators/A&P                         MDM  Screen complete   SHENIQUA CAROLAN was evaluated in Emergency Department on 06/24/2020 for the symptoms described in the history of present illness. She was evaluated in the context of the global COVID-19 pandemic, which necessitated consideration that the patient might be at risk for infection with the SARS-CoV-2 virus that causes COVID-19. Institutional protocols and algorithms that pertain to the evaluation of patients at risk for COVID-19 are in a state of rapid change based on information released by regulatory bodies including the CDC and federal and state organizations. These policies and algorithms were followed during the patient's care in the ED.  Patient is presenting with low back pain.  Patient's pain is related to recent lifting while at work.  Patient's history and exam are not suggestive of more significant acute pathology.  Patient will be treated for likely muscular strain of the low back.  Patient is appropriate for further outpatient  management.  Importance of close follow-up was stressed.  Strict return precautions given and understood.     Final Clinical Impression(s) / ED Diagnoses Final diagnoses:  Acute low back pain without sciatica, unspecified back pain laterality    Rx / DC Orders ED Discharge Orders         Ordered    traMADol (ULTRAM) 50 MG tablet  Every 6 hours PRN        06/24/20 2158           Wynetta Fines, MD 06/24/20 2217

## 2020-06-24 NOTE — Discharge Instructions (Signed)
Please return for any problem.  °

## 2020-06-26 ENCOUNTER — Ambulatory Visit: Payer: Managed Care, Other (non HMO) | Admitting: Acute Care

## 2020-07-01 ENCOUNTER — Encounter: Payer: Self-pay | Admitting: Family Medicine

## 2020-07-01 ENCOUNTER — Other Ambulatory Visit: Payer: Self-pay

## 2020-07-01 ENCOUNTER — Ambulatory Visit: Payer: Managed Care, Other (non HMO) | Attending: Family Medicine | Admitting: Family Medicine

## 2020-07-01 DIAGNOSIS — I10 Essential (primary) hypertension: Secondary | ICD-10-CM

## 2020-07-01 DIAGNOSIS — F32A Depression, unspecified: Secondary | ICD-10-CM

## 2020-07-01 DIAGNOSIS — M545 Low back pain, unspecified: Secondary | ICD-10-CM | POA: Diagnosis not present

## 2020-07-01 DIAGNOSIS — R7303 Prediabetes: Secondary | ICD-10-CM | POA: Diagnosis not present

## 2020-07-01 DIAGNOSIS — J441 Chronic obstructive pulmonary disease with (acute) exacerbation: Secondary | ICD-10-CM | POA: Diagnosis not present

## 2020-07-01 DIAGNOSIS — Z720 Tobacco use: Secondary | ICD-10-CM

## 2020-07-01 MED ORDER — METFORMIN HCL 500 MG PO TABS: 500.0000 mg | ORAL_TABLET | Freq: Two times a day (BID) | ORAL | 3 refills | Status: DC

## 2020-07-01 MED ORDER — SPIRIVA RESPIMAT 1.25 MCG/ACT IN AERS: 2.0000 | INHALATION_SPRAY | Freq: Every day | RESPIRATORY_TRACT | 3 refills | Status: DC

## 2020-07-01 MED ORDER — FLUOXETINE HCL 20 MG PO TABS: 40.0000 mg | ORAL_TABLET | Freq: Every day | ORAL | 3 refills | Status: DC

## 2020-07-01 MED ORDER — TIZANIDINE HCL 4 MG PO TABS: 4.0000 mg | ORAL_TABLET | Freq: Three times a day (TID) | ORAL | 0 refills | Status: DC | PRN

## 2020-07-01 MED ORDER — AMLODIPINE BESYLATE 10 MG PO TABS: 10.0000 mg | ORAL_TABLET | Freq: Every day | ORAL | 6 refills | Status: DC

## 2020-07-01 MED ORDER — PREDNISONE 20 MG PO TABS: 40.0000 mg | ORAL_TABLET | Freq: Every day | ORAL | 0 refills | Status: DC

## 2020-07-01 NOTE — Progress Notes (Deleted)
Subjective:  Patient ID: Marissa Owens, female    DOB: 12-Aug-1960  Age: 60 y.o. MRN: [id number]  CC: Back Pain   HPI Marissa Owens is a 60 year old female with history of type 2 diabetes mellitus (A1c 6.0), COPD  She was last seen in the clinic in 07/2019 Her lower back hurts when she moves but at rest is absent.  Her COPD is uncontrolled and she coughs a lot with production of mucus. Smokes a pack of cig/day Past Medical History:  Diagnosis Date  . COPD (chronic obstructive pulmonary disease) (HCC)   . Diabetes mellitus without complication (HCC)   . Hypertension   . Renal disorder    kidney stone    Past Surgical History:  Procedure Laterality Date  . TUBAL LIGATION      Family History  Problem Relation Age of Onset  . Cancer Mother   . Hypertension Mother   . Lung cancer Brother     No Known Allergies  Outpatient Medications Prior to Visit  Medication Sig Dispense Refill  . albuterol (PROVENTIL) (2.5 MG/3ML) 0.083% nebulizer solution Take 3 mLs (2.5 mg total) by nebulization every 6 (six) hours as needed for wheezing or shortness of breath. 75 mL 12  . albuterol (PROVENTIL) (2.5 MG/3ML) 0.083% nebulizer solution Take 3 mLs (2.5 mg total) by nebulization every 6 (six) hours as needed for wheezing or shortness of breath. 75 mL 12  . albuterol (VENTOLIN HFA) 108 (90 Base) MCG/ACT inhaler Inhale 2 puffs into the lungs every 6 (six) hours as needed for wheezing or shortness of breath. 18 g 0  . amLODipine (NORVASC) 10 MG tablet Take 1 tablet (10 mg total) by mouth daily. 30 tablet 0  . FLUoxetine (PROZAC) 20 MG tablet Take 3 tablets (60 mg total) by mouth daily. 90 tablet 2  . metFORMIN (GLUCOPHAGE) 500 MG tablet Take 1 tablet (500 mg total) by mouth 2 (two) times daily with a meal. 60 tablet 0  . Respiratory Therapy Supplies (ADULT MASK) MISC Needs an mask for her nebulizer machine 1 each 0  . risperiDONE (RISPERDAL) 2 MG tablet Take 1 tablet (2 mg total) by mouth  2 (two) times daily. 60 tablet 2  . tiZANidine (ZANAFLEX) 4 MG tablet Take 1-2 tablets (4-8 mg total) by mouth every 6 (six) hours as needed for muscle spasms. 21 tablet 0  . traMADol (ULTRAM) 50 MG tablet Take 1 tablet (50 mg total) by mouth every 6 (six) hours as needed. 10 tablet 0  . fluticasone furoate-vilanterol (BREO ELLIPTA) 200-25 MCG/INH AEPB Inhale 1 puff into the lungs daily. (Patient not taking: Reported on 07/01/2020) 14 each 0  . guaiFENesin (MUCINEX) 600 MG 12 hr tablet Take 1 tablet (600 mg total) by mouth 2 (two) times daily. (Patient not taking: Reported on 07/01/2020) 10 tablet 0  . Nebulizers (COMPRESSOR/NEBULIZER) MISC 1 machine Dx : COPD (Patient not taking: Reported on 07/01/2020) 1 each 0  . predniSONE (DELTASONE) 20 MG tablet Take 2 tablets (40 mg total) by mouth daily with breakfast. (Patient not taking: Reported on 07/01/2020) 10 tablet 0  . promethazine-dextromethorphan (PROMETHAZINE-DM) 6.25-15 MG/5ML syrup Take 5 mLs by mouth 4 (four) times daily as needed for cough. (Patient not taking: Reported on 07/01/2020) 100 mL 0   No facility-administered medications prior to visit.     ROS Review of Systems  Objective:  There were no vitals taken for this visit.  BP/Weight 06/24/2020 06/20/2020 06/04/2020  Systolic BP 130 128 139  Diastolic BP 85 80 93  Wt. (Lbs) 167 167 -  BMI 30.54 30.54 -      Physical Exam  CMP Latest Ref Rng & Units 05/09/2020 08/03/2019 08/02/2019  Glucose 70 - 99 mg/dL 570(V) 779(T) 903(E)  BUN 6 - 20 mg/dL 11 16 11   Creatinine 0.44 - 1.00 mg/dL 0.92 3.30  Sodium 135 - 145 mmol/L 142 138 139  Potassium 3.5 - 5.1 mmol/L 4.0 3.4(L) 3.6  Chloride 98 - 111 mmol/L 104 98 100  CO2 22 - 32 mmol/L 29 28 30   Calcium 8.9 - 10.3 mg/dL 9.2 9.0 9.1  Total Protein 6.5 - 8.1 g/dL - 6.4(L) 7.2  Total Bilirubin 0.3 - 1.2 mg/dL - 0.76) 0.5  Alkaline Phos 38 - 126 U/L - 85 103  AST 15 - 41 U/L - 18 25  ALT 0 - 44 U/L - 23 30    Lipid Panel      Component Value Date/Time   CHOL 181 07/31/2019 0901   TRIG 92 07/31/2019 0901   HDL 38 (L) 07/31/2019 0901   CHOLHDL 4.8 07/31/2019 0901   VLDL 18 07/31/2019 0901   LDLCALC 125 (H) 07/31/2019 0901    CBC    Component Value Date/Time   WBC 6.9 05/09/2020 0344   RBC 4.85 05/09/2020 0344   HGB 15.5 (H) 05/09/2020 0344   HCT 46.6 (H) 05/09/2020 0344   PLT 204 05/09/2020 0344   MCV 96.1 05/09/2020 0344   MCH 32.0 05/09/2020 0344   MCHC 33.3 05/09/2020 0344   RDW 12.7 05/09/2020 0344   LYMPHSABS 3.2 05/09/2020 0344   MONOABS 0.6 05/09/2020 0344   EOSABS 0.2 05/09/2020 0344   BASOSABS 0.1 05/09/2020 0344    Lab Results  Component Value Date   HGBA1C 6.0 (H) 07/31/2019    Assessment & Plan:  There are no diagnoses linked to this encounter.  Health Care Maintenance: *** No orders of the defined types were placed in this encounter.   Follow-up: No follow-ups on file.       05/11/2020, MD, FAAFP. [facility] [place], [facility] [place]   07/01/2020, 10:38 AM

## 2020-07-01 NOTE — Progress Notes (Signed)
Virtual Visit via Telephone Note  I connected with Marissa Owens, on 07/01/2020 at 1:07 PM by telephone due to the COVID-19 pandemic and verified that I am speaking with the correct person using two identifiers.   Consent: I discussed the limitations, risks, security and privacy concerns of performing an evaluation and management service by telephone and the availability of in person appointments. I also discussed with the patient that there may be a patient responsible charge related to this service. The patient expressed understanding and agreed to proceed.   Location of Patient: Home  Location of Provider: Clinic   Persons participating in Telemedicine visit: Blannie Shedlock [place]-CMA Dr. Alvis Lemmings     History of Present Illness: Marissa Owens is a 60 year old female with history of PreDiabetes (A1c 6.0), COPD, hypertension, depression who is seen for an acute visit today. She was last seen in the clinic in 07/2019. Her chart reveals a diagnosis of type 2 diabetes mellitus however she has no A1c above 6.5 and patient is unsure if she has diabetes or prediabetes.  Her lower back hurts when she moves but at rest is absent.  Pain does not radiate down her lower extremities, she denies presence of numbness or tingling in her legs and has not had any recent falls.  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  She was placed on tramadol and subsequently discharged.  She would like a prescription for a muscle relaxant.  Her COPD is uncontrolled and she coughs a lot with production of mucus.  Denies presence of chest pain, fever.  Smokes a pack of cig/day.  Endorses compliance with her inhalers Breo and albuterol and she is followed by pulmonary.  She informs me she had told her pulmonologist about her symptoms but was informed that she knew what to do when needed to do it; she is of the  opinion this was in relation to her smoking.    Past Medical History:  Diagnosis Date  . COPD (chronic obstructive pulmonary disease) (HCC)   . Diabetes mellitus without complication (HCC)   . Hypertension   . Renal disorder    kidney stone   No Known Allergies  Current Outpatient Medications on File Prior to Visit  Medication Sig Dispense Refill  . albuterol (PROVENTIL) (2.5 MG/3ML) 0.083% nebulizer solution Take 3 mLs (2.5 mg total) by nebulization every 6 (six) hours as needed for wheezing or shortness of breath. 75 mL 12  . albuterol (PROVENTIL) (2.5 MG/3ML) 0.083% nebulizer solution Take 3 mLs (2.5 mg total) by nebulization every 6 (six) hours as needed for wheezing or shortness of breath. 75 mL 12  . albuterol (VENTOLIN HFA) 108 (90 Base) MCG/ACT inhaler Inhale 2 puffs into the lungs every 6 (six) hours as needed for wheezing or shortness of breath. 18 g 0  . Respiratory Therapy Supplies (ADULT MASK) MISC Needs an mask for her nebulizer machine 1 each 0  . risperiDONE (RISPERDAL) 2 MG tablet Take 1 tablet (2 mg total) by mouth 2 (two) times daily. 60 tablet 2  . traMADol (ULTRAM) 50 MG tablet Take 1 tablet (50 mg total) by mouth every 6 (six) hours as needed. 10 tablet 0  . fluticasone furoate-vilanterol (BREO ELLIPTA) 200-25 MCG/INH AEPB Inhale 1 puff into the lungs daily. (Patient not taking: Reported on 07/01/2020) 14 each 0  . guaiFENesin (MUCINEX) 600 MG 12 hr tablet  Take 1 tablet (600 mg total) by mouth 2 (two) times daily. (Patient not taking: Reported on 07/01/2020) 10 tablet 0  . Nebulizers (COMPRESSOR/NEBULIZER) MISC 1 machine Dx : COPD (Patient not taking: Reported on 07/01/2020) 1 each 0  . [DISCONTINUED] furosemide (LASIX) 20 MG tablet Take 1 tablet (20 mg total) by mouth daily for 7 days. (Patient not taking: Reported on 01/10/2019) 7 tablet 0   No current facility-administered medications on file prior to visit.    ROS: See HPI  Observations/Objective: Awake, alert,  oriented x3 Speaks in full sentences Not in acute distress Normal mood  Assessment and Plan: 1. Depression, unspecified depression type Stable - FLUoxetine (PROZAC) 20 MG tablet; Take 2 tablets (40 mg total) by mouth daily.  Dispense: 60 tablet; Refill: 3  2. Acute midline low back pain without sciatica Secondary to trauma at work No red flags at this time Tizanidine initiated - tiZANidine (ZANAFLEX) 4 MG tablet; Take 1 tablet (4 mg total) by mouth every 8 (eight) hours as needed for muscle spasms.  Dispense: 60 tablet; Refill: 0  3. COPD exacerbation (HCC) Uncontrolled Advised that smoking cessation will be beneficial We'll initiate Spiriva; short course of prednisone Follow-up with pulmonary - Tiotropium Bromide Monohydrate (SPIRIVA RESPIMAT) 1.25 MCG/ACT AERS; Inhale 2 puffs into the lungs daily.  Dispense: 4 g; Refill: 3 - predniSONE (DELTASONE) 20 MG tablet; Take 2 tablets (40 mg total) by mouth daily with breakfast.  Dispense: 10 tablet; Refill: 0  4. Prediabetes Continue Metformin - metFORMIN (GLUCOPHAGE) 500 MG tablet; Take 1 tablet (500 mg total) by mouth 2 (two) times daily with a meal.  Dispense: 60 tablet; Refill: 3  5. Tobacco abuse Spent 3 minutes counseling on smoking cessation including pharmacotherapeutic options available.  She is not ready to quit smoking yet.  6. Essential hypertension Stable Counseled on blood pressure goal of less than 130/80, low-sodium, DASH diet, medication compliance, 150 minutes of moderate intensity exercise per week. Discussed medication compliance, adverse effects. - amLODipine (NORVASC) 10 MG tablet; Take 1 tablet (10 mg total) by mouth daily.  Dispense: 30 tablet; Refill: 6   Follow Up Instructions: Return in about 6 weeks (around 08/12/2020) for COPD, prediabetes..    I discussed the assessment and treatment plan with the patient. The patient was provided an opportunity to ask questions and all were answered. The patient  agreed with the plan and demonstrated an understanding of the instructions.   The patient was advised to call back or seek an in-person evaluation if the symptoms worsen or if the condition fails to improve as anticipated.     I spent 14 minutes during this encounter.   Hoy Register, MD, FAAFP. [facility] [place], [place] [phone]   07/01/2020, 1:07 PM

## 2020-07-01 NOTE — Progress Notes (Signed)
Needs refill on all medication. Has been having back pain and requesting muscle relaxer.

## 2020-07-02 NOTE — Progress Notes (Signed)
Pt. Was seen by Dr. Celine Mans 1/27, please see OV note dated 1/27

## 2020-08-09 IMAGING — CR DG CHEST 2V
2 series · 2 of 2 positions shown · non-contrast
Comparison: 07/03/2019

CLINICAL DATA: COPD.

EXAM:
CHEST - 2 VIEW

[chest pa]
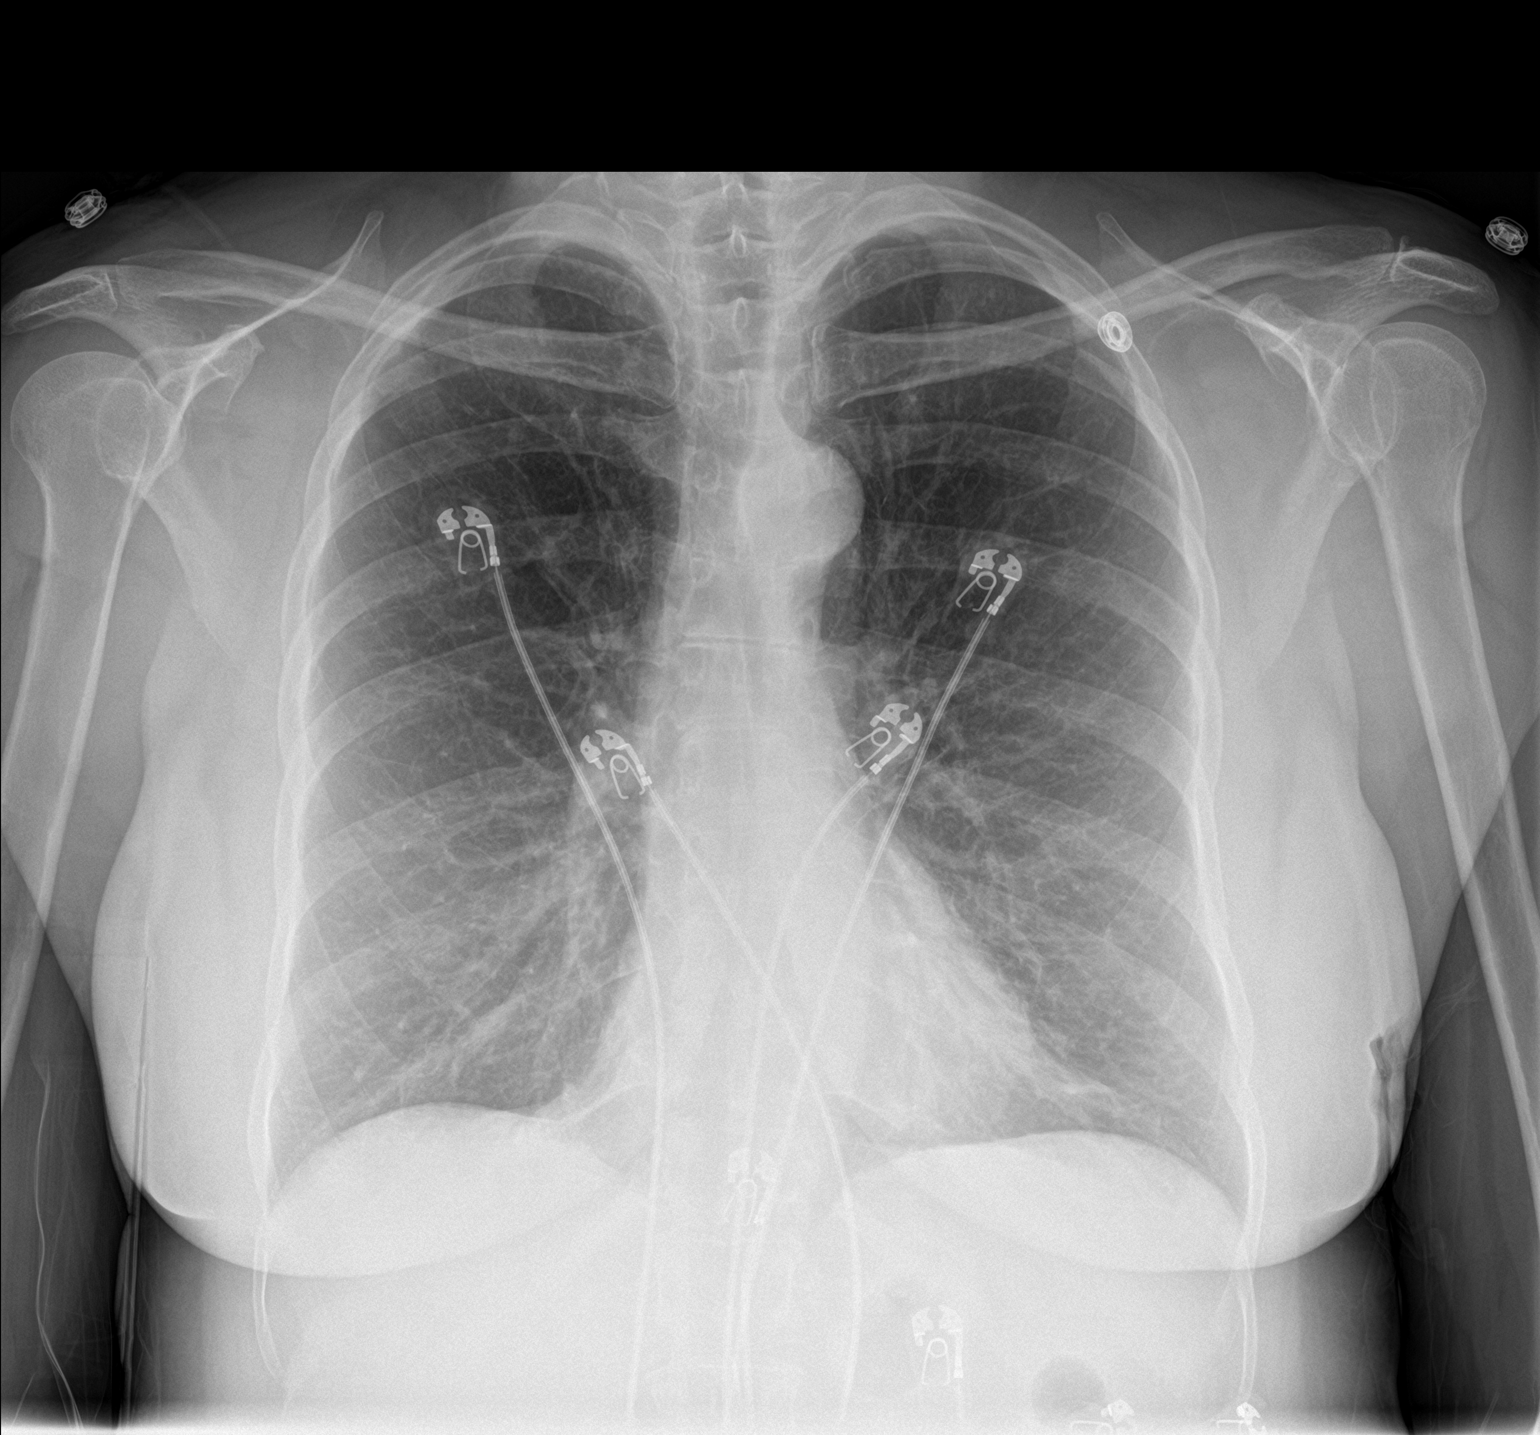

[chest lat]
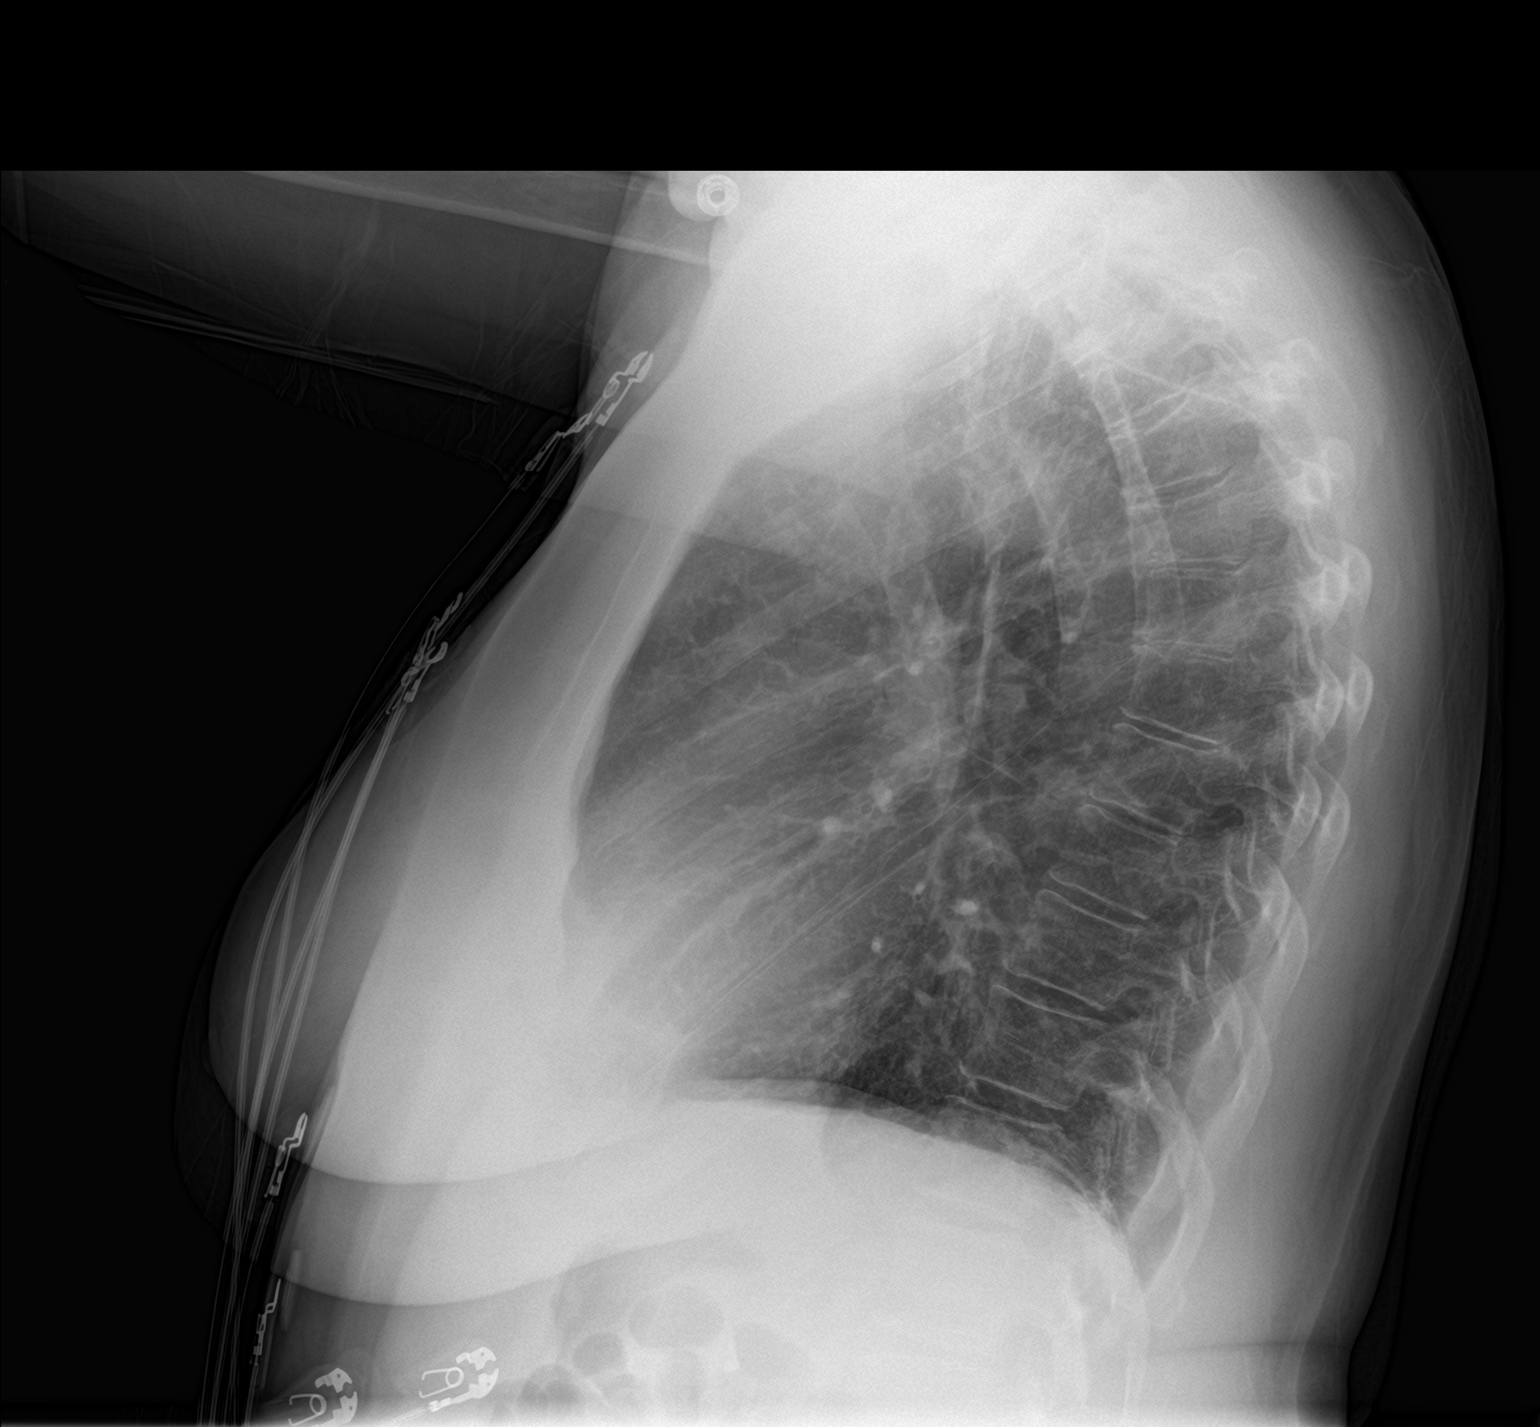

[2 of 2 positions shown; findings below may reference images not displayed]

FINDINGS: The heart size is stable. Aortic calcifications are noted. There is
stable atelectasis versus scarring at the left lung base. The lungs
are somewhat hyperexpanded. There is no pneumothorax or large focal
infiltrate. No pleural effusion. No acute osseous abnormality.
IMPRESSION: No acute cardiopulmonary disease. Stable atelectasis versus scarring
at the left lung base.

Hyperexpanded lungs which can be seen in patients with COPD.

## 2020-08-14 ENCOUNTER — Ambulatory Visit: Payer: Managed Care, Other (non HMO) | Admitting: Family Medicine

## 2020-09-11 ENCOUNTER — Telehealth: Payer: Self-pay | Admitting: Internal Medicine

## 2020-09-11 MED ORDER — ALBUTEROL SULFATE (2.5 MG/3ML) 0.083% IN NEBU: 2.5000 mg | INHALATION_SOLUTION | Freq: Four times a day (QID) | RESPIRATORY_TRACT | 12 refills | Status: DC | PRN

## 2020-09-11 NOTE — Telephone Encounter (Signed)
Refill sent in to pharmacy 

## 2020-09-14 ENCOUNTER — Other Ambulatory Visit: Payer: Self-pay | Admitting: Family Medicine

## 2020-09-14 DIAGNOSIS — J441 Chronic obstructive pulmonary disease with (acute) exacerbation: Secondary | ICD-10-CM

## 2020-09-15 NOTE — Telephone Encounter (Signed)
Requested medication (s) are due for refill today: -  Requested medication (s) are on the active medication list: yes  Last refill:  07/01/20  Future visit scheduled: no  Notes to clinic:  med RF not delegated to NT    Requested Prescriptions  Pending Prescriptions Disp Refills   predniSONE (DELTASONE) 20 MG tablet [Pharmacy Med Name: predniSONE 20 MG Oral Tablet] 10 tablet 0    Sig: TAKE 2 TABLETS BY MOUTH ONCE DAILY WITH BREAKFAST      Not Delegated - Endocrinology:  Oral Corticosteroids Failed - 09/14/2020  7:07 PM      Failed - This refill cannot be delegated      Passed - Last BP in normal range    BP Readings from Last 1 Encounters:  06/24/20 130/85          Passed - Valid encounter within last 6 months    Recent Outpatient Visits           2 months ago Acute midline low back pain without sciatica   [facility] Hoy Register, MD   1 year ago Acute respiratory failure with hypoxia [facility])   [facility] [place], [place], [place]   1 year ago Acute right lower quadrant pain   [facility] Fulp, [place], [place]   2 years ago Type 2 diabetes mellitus with hyperglycemia, without long-term current use of insulin [facility])   [facility] [place], [place], [place]

## 2020-09-21 ENCOUNTER — Encounter (HOSPITAL_COMMUNITY): Payer: Self-pay

## 2020-09-21 ENCOUNTER — Emergency Department (HOSPITAL_COMMUNITY)
Admission: EM | Admit: 2020-09-21 | Discharge: 2020-09-21 | Disposition: A | Discharge status: Home / Self Care | Payer: Managed Care, Other (non HMO) | Attending: Emergency Medicine | Admitting: Emergency Medicine

## 2020-09-21 ENCOUNTER — Other Ambulatory Visit: Payer: Self-pay

## 2020-09-21 DIAGNOSIS — Z7984 Long term (current) use of oral hypoglycemic drugs: Secondary | ICD-10-CM | POA: Insufficient documentation

## 2020-09-21 DIAGNOSIS — I10 Essential (primary) hypertension: Secondary | ICD-10-CM | POA: Insufficient documentation

## 2020-09-21 DIAGNOSIS — R600 Localized edema: Secondary | ICD-10-CM | POA: Diagnosis not present

## 2020-09-21 DIAGNOSIS — F17210 Nicotine dependence, cigarettes, uncomplicated: Secondary | ICD-10-CM | POA: Diagnosis not present

## 2020-09-21 DIAGNOSIS — K644 Residual hemorrhoidal skin tags: Secondary | ICD-10-CM | POA: Diagnosis present

## 2020-09-21 DIAGNOSIS — E119 Type 2 diabetes mellitus without complications: Secondary | ICD-10-CM | POA: Diagnosis not present

## 2020-09-21 DIAGNOSIS — J441 Chronic obstructive pulmonary disease with (acute) exacerbation: Secondary | ICD-10-CM | POA: Diagnosis not present

## 2020-09-21 DIAGNOSIS — Z79899 Other long term (current) drug therapy: Secondary | ICD-10-CM | POA: Diagnosis not present

## 2020-09-21 MED ORDER — POLYETHYLENE GLYCOL 3350 17 G PO PACK: 17.0000 g | PACK | Freq: Every day | ORAL | 0 refills | Status: DC | PRN

## 2020-09-21 MED ORDER — HYDROCORTISONE ACETATE 25 MG RE SUPP: 25.0000 mg | Freq: Two times a day (BID) | RECTAL | 0 refills | Status: DC

## 2020-09-21 NOTE — ED Provider Notes (Signed)
[facility]-EMERGENCY DEPT Provider Note   CSN: [id number] Arrival date & time: 09/21/20  0424     History Chief Complaint  Patient presents with  . Hemorrhoids    Marissa Owens is a 60 y.o. female with history of COPD, diabetes mellitus, and hypertension who presents to the emergency department with complaints of hemorrhoids that began yesterday.  Patient states that she has hemorrhoids which are painful, worse when she has a bowel movement, had a little bit of blood with 1 yesterday.  Her bowel movements have been small and somewhat hard, however she is continue to have bowel movements and pass gas.  No other alleviating aggravating factors.  No intervention prior to arrival.  She is concerned about working like this.  She denies fever, chills, vomiting, or significant abdominal pain.  HPI     Past Medical History:  Diagnosis Date  . COPD (chronic obstructive pulmonary disease) (HCC)   . Diabetes mellitus without complication (HCC)   . Hypertension   . Renal disorder    kidney stone    Patient Active Problem List   Diagnosis Date Noted  . Acute respiratory failure (HCC) 07/13/2017  . Tobacco abuse 07/13/2017  . COPD exacerbation (HCC) 07/11/2017    Past Surgical History:  Procedure Laterality Date  . TUBAL LIGATION       OB History   No obstetric history on file.     Family History  Problem Relation Age of Onset  . Cancer Mother   . Hypertension Mother   . Lung cancer Brother     Social History   Tobacco Use  . Smoking status: Current Every Day Smoker    Packs/day: 1.00    Types: Cigarettes  . Smokeless tobacco: Never Used  . Tobacco comment: 30  Vaping Use  . Vaping Use: Never used  Substance Use Topics  . Alcohol use: No  . Drug use: No    Home Medications Prior to Admission medications   Medication Sig Start Date End Date Taking? Authorizing Provider  albuterol (PROVENTIL) (2.5 MG/3ML) 0.083% nebulizer solution Take 3  mLs (2.5 mg total) by nebulization every 6 (six) hours as needed for wheezing or shortness of breath. 08/14/19   Charlott Holler, MD  albuterol (PROVENTIL) (2.5 MG/3ML) 0.083% nebulizer solution Take 3 mLs (2.5 mg total) by nebulization every 6 (six) hours as needed for wheezing or shortness of breath. 09/11/20   Charlott Holler, MD  albuterol (VENTOLIN HFA) 108 (90 Base) MCG/ACT inhaler Inhale 2 puffs into the lungs every 6 (six) hours as needed for wheezing or shortness of breath. 08/03/19   Marguerita Merles Latif, DO  amLODipine (NORVASC) 10 MG tablet Take 1 tablet (10 mg total) by mouth daily. 07/01/20   Hoy Register, MD  FLUoxetine (PROZAC) 20 MG tablet Take 2 tablets (40 mg total) by mouth daily. 07/01/20   Hoy Register, MD  fluticasone furoate-vilanterol (BREO ELLIPTA) 200-25 MCG/INH AEPB Inhale 1 puff into the lungs daily. Patient not taking: Reported on 07/01/2020 08/14/19   Charlott Holler, MD  guaiFENesin (MUCINEX) 600 MG 12 hr tablet Take 1 tablet (600 mg total) by mouth 2 (two) times daily. Patient not taking: Reported on 07/01/2020 08/03/19   Marguerita Merles Latif, DO  metFORMIN (GLUCOPHAGE) 500 MG tablet Take 1 tablet (500 mg total) by mouth 2 (two) times daily with a meal. 07/01/20   Hoy Register, MD  Nebulizers (COMPRESSOR/NEBULIZER) MISC 1 machine Dx : COPD Patient not taking: Reported on  07/01/2020 07/13/17   Joseph Art, DO  predniSONE (DELTASONE) 20 MG tablet Take 2 tablets (40 mg total) by mouth daily with breakfast. 07/01/20   Hoy Register, MD  Respiratory Therapy Supplies (ADULT MASK) MISC Needs an mask for her nebulizer machine 07/03/19   Fulp, Cammie, MD  risperiDONE (RISPERDAL) 2 MG tablet Take 1 tablet (2 mg total) by mouth 2 (two) times daily. 01/10/19   Eustace Moore, MD  Tiotropium Bromide Monohydrate (SPIRIVA RESPIMAT) 1.25 MCG/ACT AERS Inhale 2 puffs into the lungs daily. 07/01/20   Hoy Register, MD  tiZANidine (ZANAFLEX) 4 MG tablet Take 1 tablet (4 mg total) by mouth  every 8 (eight) hours as needed for muscle spasms. 07/01/20   Hoy Register, MD  traMADol (ULTRAM) 50 MG tablet Take 1 tablet (50 mg total) by mouth every 6 (six) hours as needed. 06/24/20   Wynetta Fines, MD  furosemide (LASIX) 20 MG tablet Take 1 tablet (20 mg total) by mouth daily for 7 days. Patient not taking: Reported on 01/10/2019 04/14/18 01/10/19  Anders Simmonds, PA-C    Allergies    Patient has no known allergies.  Review of Systems   Review of Systems  Constitutional: Negative for chills and fever.  Respiratory: Negative for shortness of breath.   Cardiovascular: Negative for chest pain.  Gastrointestinal: Positive for constipation and rectal pain. Negative for abdominal pain and vomiting.    Physical Exam Updated Vital Signs BP (!) 146/100   Pulse 95   Temp 98.6 F (37 C)   Resp 17   Ht 5\' 1"  (1.549 m)   Wt 72.6 kg   SpO2 95%   BMI 30.23 kg/m   Physical Exam Vitals and nursing note reviewed.  Constitutional:      General: She is not in acute distress.    Appearance: She is well-developed. She is not toxic-appearing.  HENT:     Head: Normocephalic and atraumatic.  Eyes:     General:        Right eye: No discharge.        Left eye: No discharge.     Conjunctiva/sclera: Conjunctivae normal.  Cardiovascular:     Rate and Rhythm: Normal rate and regular rhythm.  Pulmonary:     Effort: Pulmonary effort is normal. No respiratory distress.     Breath sounds: Normal breath sounds. No wheezing, rhonchi or rales.  Abdominal:     General: There is no distension.     Palpations: Abdomen is soft.     Tenderness: There is no abdominal tenderness. There is no guarding or rebound.  Genitourinary:    Comments: Chaperone present during exam.  Patient has 2 external nonthrombosed hemorrhoids present.  These are tender to palpation.  There is no bright red blood per rectum or melena noted.  There are no areas of fluctuance or induration. Musculoskeletal:     Cervical  back: Neck supple.     Comments: Trace symmetric pitting edema to the bilateral lower legs.  No calf tenderness.  No significant erythema or increased warmth.  Skin:    General: Skin is warm and dry.     Findings: No rash.  Neurological:     Mental Status: She is alert.     Comments: Clear speech.   Psychiatric:        Behavior: Behavior normal.     ED Results / Procedures / Treatments   Labs (all labs ordered are listed, but only abnormal results are displayed) Labs  Reviewed - No data to display  EKG None  Radiology No results found.  Procedures Procedures   Medications Ordered in ED Medications - No data to display  ED Course  I have reviewed the triage vital signs and the nursing notes.  Pertinent labs & imaging results that were available during my care of the patient were reviewed by me and considered in my medical decision making (see chart for details).    MDM Rules/Calculators/A&P                         Patient presents to the ED with complaints of hemorrhoids.  Nontoxic, resting comfortably, vitals without significant abnormality-BP elevated, doubt hypertensive emergency.  Additional history obtained:  Additional history obtained from chart review & nursing note review.   ED Course:  Exam with external hemorrhoids present, no thrombosed hemorrhoids, no active bleeding, no melena, no abdominal tenderness or peritoneal signs.  Patient is not vomiting, she is passing gas and stool, do not suspect obstruction.  There are no findings to suggest abscess on external exam. Will start patient on MiraLAX as well as Anusol with PCP follow-up.  She was noted to have some trace lower extremity edema, she has no dyspnea, lungs are clear to auscultation bilaterally, states this is worse after she has been working all day, I recommended that she utilize compression stockings and elevate her legs with close PCP follow-up for this.  I discussed treatment plan, need for follow-up,  and return precautions with the patient. Provided opportunity for questions, patient confirmed understanding and is in agreement with plan.   Portions of this note were generated with Scientist, clinical (histocompatibility and immunogenetics). Dictation errors may occur despite best attempts at proofreading.  Final Clinical Impression(s) / ED Diagnoses Final diagnoses:  External hemorrhoids    Rx / DC Orders ED Discharge Orders         Ordered    polyethylene glycol (MIRALAX) 17 g packet  Daily PRN        09/21/20 0641    hydrocortisone (ANUSOL-HC) 25 MG suppository  2 times daily        09/21/20 0641           Cherly Anderson, PA-C 09/21/20 [id number]    Zadie Rhine, MD 09/22/20 [id number]

## 2020-09-21 NOTE — ED Notes (Signed)
Pt discharged from this ED in stable condition at this time. All discharge instructions and follow up care reviewed with pt with no further questions at this time. Pt ambulatory with steady gait, clear speech.  

## 2020-09-21 NOTE — ED Triage Notes (Signed)
Pt reports external hemorrhoids manifesting yesterday.

## 2020-09-21 NOTE — Discharge Instructions (Addendum)
You were seen in the emergency department today for hemorrhoids.  Please see attached information.  We are sending home with Anusol suppositories to place once every 12 hours as needed for hemorrhoid/discomfort.  Please follow instructions for sitz bath's.  Please take MiraLAX once per day as needed for constipation and follow attached diet guidelines.  We have prescribed you new medication(s) today. Discuss the medications prescribed today with your pharmacist as they can have adverse effects and interactions with your other medicines including over the counter and prescribed medications. Seek medical evaluation if you start to experience new or abnormal symptoms after taking one of these medicines, seek care immediately if you start to experience difficulty breathing, feeling of your throat closing, facial swelling, or rash as these could be indications of a more serious allergic reaction   Please try compression stockings and elevate your legs to help with swelling.   Follow-up with primary care within 3 days.  Return to the ER for new or worsening symptoms including but not limited to increased pain, dark/tarry stool, inability to have a bowel movement/pass gas, abdominal pain, vomiting, passing out, dizziness, trouble breathing, or any other concerns.

## 2020-10-14 ENCOUNTER — Telehealth: Payer: Self-pay | Admitting: Internal Medicine

## 2020-10-14 MED ORDER — ALBUTEROL SULFATE HFA 108 (90 BASE) MCG/ACT IN AERS: 2.0000 | INHALATION_SPRAY | Freq: Four times a day (QID) | RESPIRATORY_TRACT | 5 refills | Status: DC | PRN

## 2020-10-14 NOTE — Telephone Encounter (Signed)
Agree with rescue inhaler. . Would recommend she be seen by APP in sick visit this week - I think she needs to be seen in person to make a complete assessment, may need additional imaging. Not sure if her swelling is related to DVT, heart failure. She doesn't have a known history of heart failure

## 2020-10-14 NOTE — Telephone Encounter (Signed)
Called and spoke with pt who stated she has noticed increased swelling in feet and legs and states she also has been having increased SOB. Pt said that she is not currently on any inhalers due to not being able to afford them.   Looked at pt's rx for her rescue inhaler and it was ordered last year. I did reorder the Rx and put a comment in there to the pharmacy to prescribe inhaler that is covered by pt's insurance that would be more affordable for pt.  Pt currently does not have Spiriva as she could not afford it. Pt does have her albuterol neb sol that she has been using 3 times a day which she said does help some.  Pt's weight back on 06/20/20 when we last saw her was 167. Asked pt what her current weight was and she said that it was 170 when she weighed herself today.  Asked pt if she had an Rx for lasix and she said that she does not.  Pt wants to know what we can recommend to help with her symptoms. Dr. Celine Mans, please advise.

## 2020-10-14 NOTE — Telephone Encounter (Signed)
Notified pt that inhaler had been called in and scheduled pt with Lanora Manis on Wed. 10/16/20. Pt stated understanding. Nothing further needed at this time.

## 2020-10-16 ENCOUNTER — Encounter: Payer: Self-pay | Admitting: Primary Care

## 2020-10-16 ENCOUNTER — Other Ambulatory Visit: Payer: Self-pay

## 2020-10-16 ENCOUNTER — Ambulatory Visit (INDEPENDENT_AMBULATORY_CARE_PROVIDER_SITE_OTHER): Payer: Managed Care, Other (non HMO) | Admitting: Primary Care

## 2020-10-16 VITALS — BP 162/100 | HR 80 | Ht 61.0 in | Wt 168.0 lb

## 2020-10-16 DIAGNOSIS — J441 Chronic obstructive pulmonary disease with (acute) exacerbation: Secondary | ICD-10-CM

## 2020-10-16 DIAGNOSIS — M7989 Other specified soft tissue disorders: Secondary | ICD-10-CM | POA: Diagnosis not present

## 2020-10-16 DIAGNOSIS — Z720 Tobacco use: Secondary | ICD-10-CM | POA: Diagnosis not present

## 2020-10-16 DIAGNOSIS — R0602 Shortness of breath: Secondary | ICD-10-CM

## 2020-10-16 LAB — CBC WITH DIFFERENTIAL/PLATELET
Basophils Absolute: 0.1 10*3/uL (ref 0.0–0.1)
Basophils Relative: 1.3 % (ref 0.0–3.0)
Eosinophils Absolute: 0.2 10*3/uL (ref 0.0–0.7)
Eosinophils Relative: 3.4 % (ref 0.0–5.0)
HCT: 42.5 % (ref 36.0–46.0)
Hemoglobin: 14.3 g/dL (ref 12.0–15.0)
Lymphocytes Relative: 37.3 % (ref 12.0–46.0)
Lymphs Abs: 2 10*3/uL (ref 0.7–4.0)
MCHC: 33.7 g/dL (ref 30.0–36.0)
MCV: 97 fl (ref 78.0–100.0)
Monocytes Absolute: 0.8 10*3/uL (ref 0.1–1.0)
Monocytes Relative: 14.7 % — ABNORMAL HIGH (ref 3.0–12.0)
Neutro Abs: 2.3 10*3/uL (ref 1.4–7.7)
Neutrophils Relative %: 43.3 % (ref 43.0–77.0)
Platelets: 194 10*3/uL (ref 150.0–400.0)
RBC: 4.38 Mil/uL (ref 3.87–5.11)
RDW: 13.6 % (ref 11.5–15.5)
WBC: 5.3 10*3/uL (ref 4.0–10.5)

## 2020-10-16 LAB — BASIC METABOLIC PANEL
BUN: 11 mg/dL (ref 6–23)
CO2: 38 mEq/L — ABNORMAL HIGH (ref 19–32)
Calcium: 9.5 mg/dL (ref 8.4–10.5)
Chloride: 98 mEq/L (ref 96–112)
Creatinine, Ser: 0.73 mg/dL (ref 0.40–1.20)
GFR: 89.32 mL/min (ref >60.00)
Glucose, Bld: 80 mg/dL (ref 70–99)
Potassium: 4.1 mEq/L (ref 3.5–5.1)
Sodium: 139 mEq/L (ref 135–145)

## 2020-10-16 LAB — BRAIN NATRIURETIC PEPTIDE: Pro B Natriuretic peptide (BNP): 63 pg/mL (ref 0.0–100.0)

## 2020-10-16 MED ORDER — SPIRIVA RESPIMAT 2.5 MCG/ACT IN AERS: 2.0000 | INHALATION_SPRAY | Freq: Every day | RESPIRATORY_TRACT | 0 refills | Status: DC

## 2020-10-16 MED ORDER — PREDNISONE 10 MG PO TABS: ORAL_TABLET | ORAL | 0 refills | Status: DC

## 2020-10-16 NOTE — Addendum Note (Signed)
Addended by: Glenford Bayley on: 10/16/2020 02:23 PM   Modules accepted: Orders

## 2020-10-16 NOTE — Assessment & Plan Note (Addendum)
-   Strongly encourage smoking cessation. Discussed tapering amount, using NRT and picking quit date. She is open to trying to cut back. Consider referral to lung cancer screening program at next visit.

## 2020-10-16 NOTE — Assessment & Plan Note (Addendum)
-   Bilateral lower extremity swelling x 2 weeks. No recent travel or calf pain. Checking BNP, will likely send in diuretic after lab results. Advised patient get compression stockings to wear during the day. May need echocardiogram.

## 2020-10-16 NOTE — Progress Notes (Signed)
@Patient  ID: , female    DOB: Nov 29, 1960, 60 y.o.   MRN: [id number]  Chief Complaint  Patient presents with  . Shortness of Breath    Referring provider: No ref. provider found  HPI: 59 year old female, current everyday smoker.  Past medical history significant for COPD.  Patient of Dr. 67, last seen in office on 06/20/2020.  Previous [facility] pulmonary encounter: 08/14/19- Dr. 08/16/19, consult  Marissa Owens is a 60 y.o. woman who presents with Progressing shortness of breath over the last 2-3 years. Recent hospital stay for COPD exacerbation. Discharged on prednisone.   Has albuterol inhaler 3-4 times/day.  Duoneb 3-4 times/ay.  Incruse - once/day  Has had repeated hospitalizations for her breathing - twice in the last 3 months, before that 3 in 2019.   Cut down on smoking since discharge from hospital from 1ppd down to 6 cigarettes total in the last 10 days. Has wheezing, chest tightness, no chest pain.  06/20/20- Dr. 06/22/20 Here for sick visit today. Has been covid negative x 2. She has had multiple COPD exacerbations requiring prednisone, one even requiring hospitalization. Most recently she went to urgent care Jan 11th and was treated with prednisone and abx. She had run out of incruse and albuterol at that time. She thinks the incruse helps her a lot, but is too expensive. The albuterol she feels she needs to take every 30 minutes. Prednisone helped but she is done with it now. She is still smoking 1/2 ppd. She has a dry cough that bothers her the most.  I have reviewed the patient's family social and past medical history and updated as appropriate.   10/16/2020- interim hx  Patient presents today for acute visit. She reports increased swelling in feet and legs x 2 weeks. Shortness of breath started 1 week later with associated cough and wheezing.  She is not currently on any maintenance inhalers d/t cost. She has been using albuterol neb three times a day with some  improvement. She has been taking mucinex but it hasn't broken up phlegm. She works as 10/18/2020, on her feet a lot. She does not have compression stockings. No recent travel. No calf pain. Denies fevers, chills, sweats, nasal congestion or PND.    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  Smoking history: 45 pack years - started at 12, 1ppd.    No Known Allergies  Immunization History  Administered Date(s) Administered  . Influenza,inj,Quad PF,6+ Mos 04/14/2018, 04/24/2019, 04/24/2020  . PFIZER(Purple Top)SARS-COV-2 Vaccination 09/16/2019, 10/05/2019    Past Medical History:  Diagnosis Date  . COPD (chronic obstructive pulmonary disease) (HCC)   . Diabetes mellitus without complication (HCC)   . Hypertension   . Renal disorder    kidney stone    Tobacco History: Social History   Tobacco Use  Smoking Status Current Every Day Smoker  . Packs/day: 1.00  . Types: Cigarettes  Smokeless Tobacco Never Used  Tobacco Comment   30   Ready to quit: Not Answered Counseling given: Not Answered Comment: 30   Outpatient Medications Prior to Visit  Medication Sig Dispense Refill  . albuterol (PROVENTIL) (2.5 MG/3ML) 0.083% nebulizer solution Take 3 mLs (2.5 mg total) by nebulization every 6 (six) hours as needed for wheezing or shortness of breath. 75 mL 12  . albuterol (VENTOLIN HFA) 108 (90 Base) MCG/ACT inhaler Inhale 2 puffs into  the lungs every 6 (six) hours as needed for wheezing or shortness of breath. 8 g 5  . amLODipine (NORVASC) 10 MG tablet Take 1 tablet (10 mg total) by mouth daily. 30 tablet 6  . FLUoxetine (PROZAC) 20 MG tablet Take 2 tablets (40 mg total) by mouth daily. 60 tablet 3  . hydrocortisone (ANUSOL-HC) 25 MG suppository Place 1 suppository (25 mg total) rectally 2 (two) times daily. 12 suppository 0  . metFORMIN (GLUCOPHAGE) 500 MG tablet  Take 1 tablet (500 mg total) by mouth 2 (two) times daily with a meal. 60 tablet 3  . Respiratory Therapy Supplies (ADULT MASK) MISC Needs an mask for her nebulizer machine 1 each 0  . risperiDONE (RISPERDAL) 2 MG tablet Take 1 tablet (2 mg total) by mouth 2 (two) times daily. 60 tablet 2  . polyethylene glycol (MIRALAX) 17 g packet Take 17 g by mouth daily as needed for moderate constipation. 15 each 0  . predniSONE (DELTASONE) 20 MG tablet Take 2 tablets (40 mg total) by mouth daily with breakfast. 10 tablet 0  . Tiotropium Bromide Monohydrate (SPIRIVA RESPIMAT) 1.25 MCG/ACT AERS Inhale 2 puffs into the lungs daily. 4 g 3  . tiZANidine (ZANAFLEX) 4 MG tablet Take 1 tablet (4 mg total) by mouth every 8 (eight) hours as needed for muscle spasms. 60 tablet 0  . traMADol (ULTRAM) 50 MG tablet Take 1 tablet (50 mg total) by mouth every 6 (six) hours as needed. 10 tablet 0   No facility-administered medications prior to visit.   Review of Systems  Review of Systems  Constitutional: Negative.   HENT: Negative for postnasal drip.   Respiratory: Positive for cough, shortness of breath and wheezing.   Cardiovascular: Positive for leg swelling.   Physical Exam  BP (!) 162/100   Pulse 80   Ht 5\' 1"  (1.549 m)   Wt 168 lb (76.2 kg)   SpO2 95%   BMI 31.74 kg/m  Physical Exam Constitutional:      General: She is not in acute distress.    Appearance: She is well-developed. She is not ill-appearing.  HENT:     Head: Normocephalic and atraumatic.  Cardiovascular:     Rate and Rhythm: Normal rate and regular rhythm.     Comments: +2 BLE edema Pulmonary:     Effort: Pulmonary effort is normal. No respiratory distress.     Breath sounds: Wheezing present.  Musculoskeletal:        General: Normal range of motion.  Skin:    General: Skin is warm and dry.  Neurological:     General: No focal deficit present.     Mental Status: She is alert and oriented to person, place, and time.   Psychiatric:        Mood and Affect: Mood normal.        Behavior: Behavior normal.      Lab Results:  CBC    Component Value Date/Time   WBC 6.9 05/09/2020 0344   RBC 4.85 05/09/2020 0344   HGB 15.5 (H) 05/09/2020 0344   HCT 46.6 (H) 05/09/2020 0344   PLT 204 05/09/2020 0344   MCV 96.1 05/09/2020 0344   MCH 32.0 05/09/2020 0344   MCHC 33.3 05/09/2020 0344   RDW 12.7 05/09/2020 0344   LYMPHSABS 3.2 05/09/2020 0344   MONOABS 0.6 05/09/2020 0344   EOSABS 0.2 05/09/2020 0344   BASOSABS 0.1 05/09/2020 0344    BMET    Component Value Date/Time  NA 142 05/09/2020 0344   NA 142 04/14/2018 1108   K 4.0 05/09/2020 0344   CL 104 05/09/2020 0344   CO2 29 05/09/2020 0344   GLUCOSE 101 (H) 05/09/2020 0344   BUN 11 05/09/2020 0344   BUN 12 04/14/2018 1108   CREATININE 0.82 05/09/2020 0344   CALCIUM 9.2 05/09/2020 0344   GFRNONAA >60 05/09/2020 0344   GFRAA >60 08/03/2019 0213    BNP    Component Value Date/Time   BNP 13.6 04/14/2018 1108   BNP 130.0 (H) 07/11/2017 0203    ProBNP No results found for: PROBNP  Imaging: No results found.   Assessment & Plan:   COPD exacerbation (HCC) - Increased sob x1 week with associated np cough/wheezing. Not using LAMA maintenance inhaler d/t cost. Using SABA 3 times a day with improvement.  - Rx prednisone 20mg  qd x 5 days - Resume Spiriva respimat 2.41mcg two puffs daily (1 month sample given- she may need to apply for patient assistance) - FU in 2-4 weeks with Dr. 4m or APP   Leg swelling - Bilateral lower extremity swelling x 2 weeks. No recent travel or calf pain. Checking BNP, will likely send in diuretic after lab results. Advised patient get compression stockings to wear during the day. May need echocardiogram.   Tobacco abuse - Strongly encourage smoking cessation. Discussed tapering amount, using NRT and picking quit date. She is open to trying to cut back. Consider referral to lung cancer screening program at  next visit.    Celine Mans, NP 10/16/2020

## 2020-10-16 NOTE — Patient Instructions (Addendum)
Recommendations: - Resume Spiriva respimat 2.105mcg- two puffs once a day in the morning (2 samples given) - Sending in short course of prednisone for wheezing - Checking labs, if fluid level is elevated we will start you on lasix    Orders: - Labs (BNP, CBC, BMET) - DME order for compression stockings   Rx: - Prednisone 20mg  x 5 days   Follow-up: - Dr. on either June 13,14,16,17th

## 2020-10-16 NOTE — Progress Notes (Signed)
Ordered echocardiogram to be completed prior to follow-up with Dr. Celine Mans

## 2020-10-16 NOTE — Progress Notes (Signed)
Please let patinet know her BNP was normal. Co2 was alittle elevated. Recommend getting echocardiogram (I already ordered) and ONO (can you please order). Also please send in lasix 20mg  x 7 days for peripheral swelling. And make sure she gets compression stockings (theres a telephone note in chart from sherry)

## 2020-10-16 NOTE — Assessment & Plan Note (Addendum)
-   Increased sob x1 week with associated np cough/wheezing. Not using LAMA maintenance inhaler d/t cost. Using SABA 3 times a day with improvement.  - Rx prednisone 20mg  qd x 5 days - Resume Spiriva respimat 2.78mcg two puffs daily (1 month sample given- she may need to apply for patient assistance) - FU in 2-4 weeks with Dr. 4m or APP

## 2020-10-18 ENCOUNTER — Telehealth: Payer: Self-pay | Admitting: Primary Care

## 2020-10-18 DIAGNOSIS — R0602 Shortness of breath: Secondary | ICD-10-CM

## 2020-10-18 MED ORDER — FUROSEMIDE 20 MG PO TABS: 20.0000 mg | ORAL_TABLET | Freq: Every day | ORAL | 0 refills | Status: DC

## 2020-10-18 NOTE — Telephone Encounter (Signed)
Please let patinet know her BNP was normal. Co2 was alittle elevated. Recommend getting echocardiogram (I already ordered) and ONO (can you please order). Also please send in lasix 20mg  x 7 days for peripheral swelling. And make sure she gets compression stockings (theres a telephone note in chart from sherry)   Called and spoke with patient. She verbalized understanding of results. She wishes to have the Lasix sent to [facility] on [address]. Order for ONO has been placed.   Nothing further needed at time of call.

## 2020-10-22 ENCOUNTER — Encounter: Payer: Self-pay | Admitting: Primary Care

## 2020-10-30 NOTE — Telephone Encounter (Signed)
I received an ONO on patient dated 05/26/1998 under my name. Can someone please call DME company, this is clearly dated in error and needs to be fix. I will need them to re-send.

## 2020-10-31 ENCOUNTER — Telehealth: Payer: Self-pay | Admitting: Primary Care

## 2020-10-31 DIAGNOSIS — G4734 Idiopathic sleep related nonobstructive alveolar hypoventilation: Secondary | ICD-10-CM

## 2020-10-31 NOTE — Telephone Encounter (Signed)
ONO 10/22/20 showed patient spent 8.7 mins with SpO2 <88% (low 80%, basal 89%). Oxygen desaturation index (ODI) was 7. Do not suspect underlying sleep apnea. Patient needs to be set up with nocturnal oxygen, 1L at bedtime.

## 2020-10-31 NOTE — Telephone Encounter (Signed)
ONO order was sent to [facility]. Called [facility] and spoke with Camelia Eng about date of ONO. Terri said that the date had been fixed. I asked Terri to refax the ONO with correct date so it could be given to [facility] as she did not want to do anything with the prior ONO due to date on it. Terri said they would refax it to [place].

## 2020-10-31 NOTE — Telephone Encounter (Signed)
Called and spoke with pt letting her know the results of the ONO and stated to her that it did qualify her for O2. Pt verbalized understanding. Stated to pt that she would only need to wear the O2 while she was sleeping.  Order has been placed. Nothing further needed.

## 2020-11-04 ENCOUNTER — Telehealth: Payer: Self-pay | Admitting: Internal Medicine

## 2020-11-05 NOTE — Telephone Encounter (Signed)
Called [facility] and they stated they do not need a qualifying walk test, only a singed order. Signed order faxed to [facility]. Nothing further needed.

## 2020-11-08 ENCOUNTER — Ambulatory Visit: Payer: Managed Care, Other (non HMO) | Admitting: Internal Medicine

## 2020-11-11 ENCOUNTER — Encounter (HOSPITAL_COMMUNITY): Payer: Self-pay | Admitting: Primary Care

## 2020-11-11 ENCOUNTER — Ambulatory Visit (HOSPITAL_COMMUNITY): Admission: RE | Admit: 2020-11-11 | Payer: Managed Care, Other (non HMO) | Source: Ambulatory Visit

## 2020-11-20 ENCOUNTER — Telehealth (HOSPITAL_COMMUNITY): Payer: Self-pay | Admitting: Primary Care

## 2020-11-20 NOTE — Telephone Encounter (Signed)
Just an FYI. We have made several attempts to contact this patient including sending a letter to schedule or reschedule their echocardiogram. We will be removing the patient from the echo WQ.  11/11/20 NO SHOWED-MAILED LETTER [facility]         Thank you  

## 2020-11-20 NOTE — Telephone Encounter (Signed)
Thank you :)

## 2020-12-02 ENCOUNTER — Ambulatory Visit: Payer: Managed Care, Other (non HMO) | Admitting: Internal Medicine

## 2020-12-09 ENCOUNTER — Telehealth: Payer: Self-pay | Admitting: Primary Care

## 2020-12-09 NOTE — Telephone Encounter (Signed)
Received ONO from 11/30/20. Patient spent 15 mins with SpO2 <88%. Basal O2 92%, low 82% RA. She already had overnight oximetry testing back in May 2022 that showed she needed to use oxygen. Please ensure patient has been set up with oxygen and is wearing. If so, nothing further needed.

## 2020-12-11 NOTE — Telephone Encounter (Signed)
Patient is declining 02 at this time. She says it will interfere with her job.

## 2020-12-12 NOTE — Telephone Encounter (Signed)
Called patient but she did not answer. Left message for her to call back.  

## 2020-12-12 NOTE — Telephone Encounter (Signed)
She only has to wear it at night, recommend she re-consider. If she declines please document

## 2020-12-17 NOTE — Telephone Encounter (Signed)
Attempted to call pt but unable to reach. Left message for her to return call. 

## 2020-12-19 NOTE — Telephone Encounter (Signed)
Attempted to call pt but unable to reach. Left message for her to return call. Due to multiple attempts trying to reach out to pt and unable to do so, per protocol encounter will be closed.

## 2020-12-20 ENCOUNTER — Telehealth: Payer: Self-pay | Admitting: Primary Care

## 2020-12-20 NOTE — Telephone Encounter (Signed)
Noted.   New order will have to be placed.   Left message with patient contact number as well as emergency contact (son) regarding giving the office a call in regards to the oxygen.   Will route message to provider as FYI to make them aware DME company was not able to get in contact with patient for oxygen delivery. Left message with patient as well as sent a letter asking that she contact the office.   Nothing further needed at this time.

## 2020-12-25 NOTE — Telephone Encounter (Signed)
Thank you, we will just need to continued to address at office visits in the future. She has declined O2 in the past

## 2021-01-17 ENCOUNTER — Emergency Department (HOSPITAL_COMMUNITY)
Admission: EM | Admit: 2021-01-17 | Discharge: 2021-01-17 | Disposition: A | Discharge status: Home / Self Care | Payer: Managed Care, Other (non HMO) | Attending: Emergency Medicine | Admitting: Emergency Medicine

## 2021-01-17 ENCOUNTER — Emergency Department (HOSPITAL_COMMUNITY): Payer: Managed Care, Other (non HMO)

## 2021-01-17 ENCOUNTER — Encounter (HOSPITAL_COMMUNITY): Payer: Self-pay

## 2021-01-17 ENCOUNTER — Other Ambulatory Visit: Payer: Self-pay

## 2021-01-17 DIAGNOSIS — J441 Chronic obstructive pulmonary disease with (acute) exacerbation: Secondary | ICD-10-CM | POA: Insufficient documentation

## 2021-01-17 DIAGNOSIS — E119 Type 2 diabetes mellitus without complications: Secondary | ICD-10-CM | POA: Insufficient documentation

## 2021-01-17 DIAGNOSIS — Z79899 Other long term (current) drug therapy: Secondary | ICD-10-CM | POA: Insufficient documentation

## 2021-01-17 DIAGNOSIS — Y92410 Unspecified street and highway as the place of occurrence of the external cause: Secondary | ICD-10-CM | POA: Insufficient documentation

## 2021-01-17 DIAGNOSIS — Z7984 Long term (current) use of oral hypoglycemic drugs: Secondary | ICD-10-CM | POA: Insufficient documentation

## 2021-01-17 DIAGNOSIS — S0990XA Unspecified injury of head, initial encounter: Secondary | ICD-10-CM | POA: Diagnosis present

## 2021-01-17 DIAGNOSIS — M542 Cervicalgia: Secondary | ICD-10-CM | POA: Diagnosis not present

## 2021-01-17 DIAGNOSIS — F17210 Nicotine dependence, cigarettes, uncomplicated: Secondary | ICD-10-CM | POA: Diagnosis not present

## 2021-01-17 DIAGNOSIS — I10 Essential (primary) hypertension: Secondary | ICD-10-CM | POA: Insufficient documentation
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
## 2021-01-17 NOTE — Discharge Instructions (Addendum)
The pain you are experiencing is likely due to muscle strain, you may take tylenol as needed for pain management.   You may also use ice and heat, and over-the-counter remedies such as Biofreeze gel or salon pas lidocaine patches. The muscle soreness should improve over the next week. Follow up with your family doctor in the next week for a recheck if you are still having symptoms. Return to ED if pain is worsening, you develop weakness or numbness of extremities, or new or concerning symptoms develop.  
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
## 2021-01-22 ENCOUNTER — Other Ambulatory Visit: Payer: Self-pay | Admitting: Family Medicine

## 2021-01-22 DIAGNOSIS — F32A Depression, unspecified: Secondary | ICD-10-CM

## 2021-01-22 NOTE — Telephone Encounter (Signed)
Courtesy refill given, appointment needed. 

## 2021-01-22 NOTE — Telephone Encounter (Signed)
Patient called, left VM to return the call to the office to schedule an OV for follow up.  ? ?

## 2021-02-10 ENCOUNTER — Telehealth: Payer: Self-pay | Admitting: Primary Care

## 2021-02-11 ENCOUNTER — Telehealth: Payer: Self-pay | Admitting: Family Medicine

## 2021-02-11 NOTE — Telephone Encounter (Signed)
I called and spoke with patient regarding disability paperwork. I informed patient that normally the employer will fax over the paperwork to [place] and we have the provider to fill it out. It seems the patient is wanting Dr. Celine Mans to put her out of work as she feels due to her health, she cannot do her job and has to call out often. Patient is seeing Dr. Celine Mans next week and will discuss this at that appt. Patient verbalized understanding, nothing further needed.

## 2021-02-11 NOTE — Telephone Encounter (Signed)
Patient is applying for disability and states she is requesting office notes reflecting PCP dx her with COPD. Please call when patient can pick up office notes.   Patient would like to schedule appointment with Mikle Bosworth for financial counseling. Patient has an appointment with PCP on 02/19/2021

## 2021-02-11 NOTE — Telephone Encounter (Signed)
I return Pt call, Pt want to apply for financial program, she was inform that I check her insurance and it show that she still active with it, she was advice to call her insurance to confirm and if the insurance was terminate she was told to get a letter from them saying what day was terminate her insurance to be able to apply for the [facility] financial program

## 2021-02-18 ENCOUNTER — Ambulatory Visit: Payer: Managed Care, Other (non HMO) | Admitting: Primary Care

## 2021-02-18 NOTE — Progress Notes (Deleted)
@Patient  ID: , female    DOB: May 23, 1961, 60 y.o.   MRN: [id number]  No chief complaint on file.   Referring provider: No ref. provider found  HPI:  60 year old female, current everyday smoker.  Past medical history significant for COPD.  Patient of Dr. 67, last seen in office on 06/20/2020.  Previous [facility] pulmonary encounter: 08/14/19- Dr. 08/16/19, consult  Marissa Owens is a 60 y.o. woman who presents with Progressing shortness of breath over the last 2-3 years. Recent hospital stay for COPD exacerbation. Discharged on prednisone.   Has albuterol inhaler 3-4 times/day.  Duoneb 3-4 times/ay.  Incruse - once/day  Has had repeated hospitalizations for her breathing - twice in the last 3 months, before that 3 in 2019.   Cut down on smoking since discharge from hospital from 1ppd down to 6 cigarettes total in the last 10 days. Has wheezing, chest tightness, no chest pain.  06/20/20- Dr. 06/22/20 Here for sick visit today. Has been covid negative x 2. She has had multiple COPD exacerbations requiring prednisone, one even requiring hospitalization. Most recently she went to urgent care Jan 11th and was treated with prednisone and abx. She had run out of incruse and albuterol at that time. She thinks the incruse helps her a lot, but is too expensive. The albuterol she feels she needs to take every 30 minutes. Prednisone helped but she is done with it now. She is still smoking 1/2 ppd. She has a dry cough that bothers her the most.  I have reviewed the patient's family social and past medical history and updated as appropriate.   10/16/2020 Patient presents today for acute visit. She reports increased swelling in feet and legs x 2 weeks. Shortness of breath started 1 week later with associated cough and wheezing.  She is not currently on any maintenance inhalers d/t cost. She has been using albuterol neb three times a day with some improvement. She has been taking mucinex but it  hasn't broken up phlegm. She works as 10/18/2020, on her feet a lot. She does not have compression stockings. No recent travel. No calf pain. Denies fevers, chills, sweats, nasal congestion or PND.   02/18/2021- Interim hx  Patient presents today for overdue follow-up. During her last visit with me we resumed Spiriva respimat 2.54mcg daily and she was given short course of oral prednisone for wheezing. Her BNP was normal, CO2 was slightly elevated. ONO in May 2022 showed patient spent 8.46mins with SpO2 <88% (low 80%, basal 89%), advised she start wearing 1L oxygen at bedtime. She no showed for echocardiogram in June and follow-up visit with Dr. July in July. She was contacted several times by DME company to set up oxygen without response.   She was involved in [facility] in August. She is coming in today for short term disability paperwork. States that she is unable to perform her job and has to call out often.    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 Exposures: lives at home with her brother who is not in good health, alcoholism.  Smoking history: 45 pack years - started at 12, 1ppd.    No Known Allergies  Immunization History  Administered Date(s) Administered   Influenza,inj,Quad PF,6+ Mos 04/14/2018, 04/24/2019, 04/24/2020   PFIZER(Purple Top)SARS-COV-2 Vaccination 09/16/2019, 10/05/2019    Past Medical History:  Diagnosis Date   COPD (chronic obstructive pulmonary disease) (HCC)    Diabetes mellitus without complication (HCC)  Hypertension    Renal disorder    kidney stone    Tobacco History: Social History   Tobacco Use  Smoking Status Every Day   Packs/day: 1.00   Types: Cigarettes  Smokeless Tobacco Never  Tobacco Comments   30   Ready to quit: Not Answered Counseling given: Not Answered Tobacco comments: 30   Outpatient Medications Prior to Visit  Medication Sig Dispense Refill   albuterol (PROVENTIL) (2.5 MG/3ML) 0.083%  nebulizer solution Take 3 mLs (2.5 mg total) by nebulization every 6 (six) hours as needed for wheezing or shortness of breath. 75 mL 12   albuterol (VENTOLIN HFA) 108 (90 Base) MCG/ACT inhaler Inhale 2 puffs into the lungs every 6 (six) hours as needed for wheezing or shortness of breath. 8 g 5   amLODipine (NORVASC) 10 MG tablet Take 1 tablet (10 mg total) by mouth daily. 30 tablet 6   FLUoxetine (PROZAC) 20 MG capsule Take 2 capsules (40 mg total) by mouth daily. OFFICE VISIT NEEDED FOR ADDITIONAL REFILLS 60 capsule 0   furosemide (LASIX) 20 MG tablet Take 1 tablet (20 mg total) by mouth daily for 7 days. 7 tablet 0   hydrocortisone (ANUSOL-HC) 25 MG suppository Place 1 suppository (25 mg total) rectally 2 (two) times daily. 12 suppository 0   metFORMIN (GLUCOPHAGE) 500 MG tablet Take 1 tablet (500 mg total) by mouth 2 (two) times daily with a meal. 60 tablet 3   polyethylene glycol (MIRALAX) 17 g packet Take 17 g by mouth daily as needed for moderate constipation. 15 each 0   predniSONE (DELTASONE) 10 MG tablet Take 2 tabs x 5 days 10 tablet 0   Respiratory Therapy Supplies (ADULT MASK) MISC Needs an mask for her nebulizer machine 1 each 0   risperiDONE (RISPERDAL) 2 MG tablet Take 1 tablet (2 mg total) by mouth 2 (two) times daily. 60 tablet 2   Tiotropium Bromide Monohydrate (SPIRIVA RESPIMAT) 2.5 MCG/ACT AERS Inhale 2 puffs into the lungs daily. 2 each 0   No facility-administered medications prior to visit.      Review of Systems  Review of Systems   Physical Exam  There were no vitals taken for this visit. Physical Exam   Lab Results:  CBC    Component Value Date/Time   WBC 5.3 10/16/2020 1121   RBC 4.38 10/16/2020 1121   HGB 14.3 10/16/2020 1121   HCT 42.5 10/16/2020 1121   PLT 194.0 10/16/2020 1121   MCV 97.0 10/16/2020 1121   MCH 32.0 05/09/2020 0344   MCHC 33.7 10/16/2020 1121   RDW 13.6 10/16/2020 1121   LYMPHSABS 2.0 10/16/2020 1121   MONOABS 0.8 10/16/2020  1121   EOSABS 0.2 10/16/2020 1121   BASOSABS 0.1 10/16/2020 1121    BMET    Component Value Date/Time   NA 139 10/16/2020 1121   NA 142 04/14/2018 1108   K 4.1 10/16/2020 1121   CL 98 10/16/2020 1121   CO2 38 (H) 10/16/2020 1121   GLUCOSE 80 10/16/2020 1121   BUN 11 10/16/2020 1121   BUN 12 04/14/2018 1108   CREATININE 0.73 10/16/2020 1121   CALCIUM 9.5 10/16/2020 1121   GFRNONAA >60 05/09/2020 0344   GFRAA >60 08/03/2019 0213    BNP    Component Value Date/Time   BNP 13.6 04/14/2018 1108   BNP 130.0 (H) 07/11/2017 0203    ProBNP    Component Value Date/Time   PROBNP 63.0 10/16/2020 1121    Imaging: No results found.  Assessment & Plan:   No problem-specific Assessment & Plan notes found for this encounter.     Glenford Bayley, NP 02/18/2021

## 2021-02-19 ENCOUNTER — Ambulatory Visit: Payer: Managed Care, Other (non HMO) | Admitting: Physician Assistant

## 2021-03-06 ENCOUNTER — Telehealth: Payer: Self-pay | Admitting: Family Medicine

## 2021-03-06 DIAGNOSIS — F32A Depression, unspecified: Secondary | ICD-10-CM

## 2021-03-06 NOTE — Telephone Encounter (Signed)
Requested Prescriptions  Pending Prescriptions Disp Refills   FLUoxetine (PROZAC) 20 MG capsule [Pharmacy Med Name: FLUoxetine HCl 20 MG Oral Capsule] 60 capsule 0    Sig: TAKE 2 CAPSULES BY MOUTH ONCE DAILY . APPOINTMENT REQUIRED FOR FUTURE REFILLS     Psychiatry:  Antidepressants - SSRI Failed - 03/06/2021  9:41 AM      Failed - Valid encounter within last 6 months    Recent Outpatient Visits           8 months ago Acute midline low back pain without sciatica   [facility] Hoy Register, MD   1 year ago Acute respiratory failure with hypoxia [facility])   [facility] [place], [place], [place]   1 year ago Acute right lower quadrant pain   [facility] Fulp, Pike, MD   2 years ago Type 2 diabetes mellitus with hyperglycemia, without long-term current use of insulin [facility]   [facility] [place], [place], [place]

## 2021-03-12 NOTE — Telephone Encounter (Signed)
Pt states she is completely out of her FLUoxetine (PROZAC) 20 MG capsule  Pt hoping to get enough to get her through to appt she made for next week, 10//26/2022.   [id number] - [place] ([place]), [place] - [address]

## 2021-03-19 ENCOUNTER — Other Ambulatory Visit: Payer: Self-pay

## 2021-03-19 ENCOUNTER — Encounter: Payer: Self-pay | Admitting: Physician Assistant

## 2021-03-19 ENCOUNTER — Ambulatory Visit: Payer: Managed Care, Other (non HMO) | Attending: Physician Assistant | Admitting: Physician Assistant

## 2021-03-19 VITALS — BP 110/81 | HR 75 | Temp 98.7°F | Resp 18 | Ht 62.0 in | Wt 148.0 lb

## 2021-03-19 DIAGNOSIS — R7303 Prediabetes: Secondary | ICD-10-CM

## 2021-03-19 DIAGNOSIS — J449 Chronic obstructive pulmonary disease, unspecified: Secondary | ICD-10-CM

## 2021-03-19 DIAGNOSIS — F32A Depression, unspecified: Secondary | ICD-10-CM | POA: Diagnosis not present

## 2021-03-19 DIAGNOSIS — I10 Essential (primary) hypertension: Secondary | ICD-10-CM | POA: Diagnosis not present

## 2021-03-19 DIAGNOSIS — Z13220 Encounter for screening for lipoid disorders: Secondary | ICD-10-CM

## 2021-03-19 DIAGNOSIS — Z1159 Encounter for screening for other viral diseases: Secondary | ICD-10-CM

## 2021-03-19 LAB — POCT GLYCOSYLATED HEMOGLOBIN (HGB A1C): Hemoglobin A1C: 5.4 % (ref 4.0–5.6)

## 2021-03-19 MED ORDER — ALBUTEROL SULFATE HFA 108 (90 BASE) MCG/ACT IN AERS
2.0000 | INHALATION_SPRAY | Freq: Four times a day (QID) | RESPIRATORY_TRACT | 5 refills | Status: DC | PRN
  Filled 2021-03-19: qty 8.5, 25d supply, fill #0

## 2021-03-19 MED ORDER — FLUTICASONE-SALMETEROL 250-50 MCG/ACT IN AEPB
1.0000 | INHALATION_SPRAY | Freq: Two times a day (BID) | RESPIRATORY_TRACT | 6 refills | Status: DC
  Filled 2021-03-19: qty 60, 30d supply, fill #0

## 2021-03-19 NOTE — Progress Notes (Signed)
Patient reports being out of BP medication for 20 days. Patient has eaten today. Patient would like to discuss refills and anxiety medication. Patient denies pain at this time.

## 2021-03-19 NOTE — Progress Notes (Signed)
Established Patient Office Visit  Subjective:  Patient ID: Marissa Owens, female    DOB: 1960-09-23  Age: 60 y.o. MRN: [id number]  CC:  Chief Complaint  Patient presents with   Medication Refill    HPI Marissa Owens reports that she has been having anxiety and depressed moods.  Reports that she is no longer taking the Prozac 40 mg.  States that she did not feel the Prozac offered any relief.  Reports that she is followed by behavioral health and is currently taking risperidone 2 mg twice a day and Depakote 1000 mg at bedtime.  States that she last saw them 1 month ago and is due to follow-up with them in 6 weeks.  States that she has not tried to follow-up with them regarding her continued depressed moods.  Adamantly denies any thoughts of self-harm.  Reports that she is using her albuterol inhaler a couple of times a day, states that she is unable to afford the Spiriva inhaler.  Reports that she does have active insurance but is no longer employed and believes that her insurance will be canceled soon.  Reports that she was told by pulmonology that she should use oxygen at night, patient declined.   Past Medical History:  Diagnosis Date   COPD (chronic obstructive pulmonary disease) (HCC)    Diabetes mellitus without complication (HCC)    Hypertension    Renal disorder    kidney stone    Past Surgical History:  Procedure Laterality Date   TUBAL LIGATION      Family History  Problem Relation Age of Onset   Cancer Mother    Hypertension Mother    Lung cancer Brother     Social History   Socioeconomic History   Marital status: Single    Spouse name: Not on file   Number of children: Not on file   Years of education: Not on file   Highest education level: Not on file  Occupational History   Not on file  Tobacco Use   Smoking status: Every Day    Packs/day: 1.00    Types: Cigarettes   Smokeless tobacco: Never   Tobacco comments:    30  Vaping Use   Vaping  Use: Never used  Substance and Sexual Activity   Alcohol use: No   Drug use: No   Sexual activity: Not Currently  Other Topics Concern   Not on file  Social History Narrative   Not on file   Social Determinants of Health   Financial Resource Strain: Not on file  Food Insecurity: Not on file  Transportation Needs: Not on file  Physical Activity: Not on file  Stress: Not on file  Social Connections: Not on file  Intimate Partner Violence: Not on file    Outpatient Medications Prior to Visit  Medication Sig Dispense Refill   albuterol (PROVENTIL) (2.5 MG/3ML) 0.083% nebulizer solution Take 3 mLs (2.5 mg total) by nebulization every 6 (six) hours as needed for wheezing or shortness of breath. 75 mL 12   amLODipine (NORVASC) 10 MG tablet Take 1 tablet (10 mg total) by mouth daily. 30 tablet 6   FLUoxetine (PROZAC) 20 MG capsule Take 2 capsules (40 mg total) by mouth daily. OFFICE VISIT NEEDED FOR ADDITIONAL REFILLS 60 capsule 0   hydrocortisone (ANUSOL-HC) 25 MG suppository Place 1 suppository (25 mg total) rectally 2 (two) times daily. 12 suppository 0   metFORMIN (GLUCOPHAGE) 500 MG tablet Take 1 tablet (500 mg total)  by mouth 2 (two) times daily with a meal. 60 tablet 3   polyethylene glycol (MIRALAX) 17 g packet Take 17 g by mouth daily as needed for moderate constipation. 15 each 0   Respiratory Therapy Supplies (ADULT MASK) MISC Needs an mask for her nebulizer machine 1 each 0   risperiDONE (RISPERDAL) 2 MG tablet Take 1 tablet (2 mg total) by mouth 2 (two) times daily. 60 tablet 2   albuterol (VENTOLIN HFA) 108 (90 Base) MCG/ACT inhaler Inhale 2 puffs into the lungs every 6 (six) hours as needed for wheezing or shortness of breath. 8 g 5   furosemide (LASIX) 20 MG tablet Take 1 tablet (20 mg total) by mouth daily for 7 days. 7 tablet 0   Tiotropium Bromide Monohydrate (SPIRIVA RESPIMAT) 2.5 MCG/ACT AERS Inhale 2 puffs into the lungs daily. 2 each 0   divalproex (DEPAKOTE ER) 500  MG 24 hr tablet Take 1,000 mg by mouth at bedtime.     predniSONE (DELTASONE) 10 MG tablet Take 2 tabs x 5 days 10 tablet 0   risperiDONE (RISPERDAL) 1 MG tablet Take 1 mg by mouth at bedtime.     No facility-administered medications prior to visit.    No Known Allergies  ROS Review of Systems  Constitutional: Negative.   HENT: Negative.    Eyes: Negative.   Respiratory:  Negative for cough, shortness of breath and wheezing.   Cardiovascular:  Negative for chest pain.  Gastrointestinal: Negative.   Endocrine: Negative.   Genitourinary: Negative.   Musculoskeletal: Negative.   Skin: Negative.   Allergic/Immunologic: Negative.   Neurological: Negative.   Hematological: Negative.   Psychiatric/Behavioral:  Positive for dysphoric mood. Negative for self-injury, sleep disturbance and suicidal ideas. The patient is nervous/anxious.      Objective:    Physical Exam Vitals and nursing note reviewed.  Constitutional:      Appearance: Normal appearance.  HENT:     Head: Normocephalic and atraumatic.     Right Ear: External ear normal.     Left Ear: External ear normal.     Nose: Nose normal.     Mouth/Throat:     Mouth: Mucous membranes are moist.     Pharynx: Oropharynx is clear.  Eyes:     Extraocular Movements: Extraocular movements intact.     Conjunctiva/sclera: Conjunctivae normal.     Pupils: Pupils are equal, round, and reactive to light.  Cardiovascular:     Rate and Rhythm: Regular rhythm.     Pulses: Normal pulses.     Heart sounds: Normal heart sounds.  Pulmonary:     Effort: Pulmonary effort is normal.     Breath sounds: Normal breath sounds.  Musculoskeletal:        General: Normal range of motion.     Cervical back: Normal range of motion and neck supple.  Skin:    General: Skin is warm and dry.  Neurological:     General: No focal deficit present.     Mental Status: She is alert and oriented to person, place, and time.  Psychiatric:        Attention  and Perception: Attention normal.        Mood and Affect: Affect is flat.        Speech: Speech normal.        Behavior: Behavior normal.        Thought Content: Thought content does not include homicidal or suicidal ideation.  Cognition and Memory: Cognition and memory normal.        Judgment: Judgment normal.    BP 110/81 (BP Location: Left Arm, Patient Position: Sitting, Cuff Size: Normal)   Pulse 75   Temp 98.7 F (37.1 C) (Oral)   Resp 18   Ht 5\' 2"  (1.575 m)   Wt 148 lb (67.1 kg)   SpO2 97%   BMI 27.07 kg/m  Wt Readings from Last 3 Encounters:  03/19/21 148 lb (67.1 kg)  10/16/20 168 lb (76.2 kg)  09/21/20 160 lb (72.6 kg)     Health Maintenance Due  Topic Date Due   Pneumococcal Vaccine 78-61 Years old (1 - PCV) Never done   URINE MICROALBUMIN  Never done   Hepatitis C Screening  Never done   TETANUS/TDAP  Never done   PAP SMEAR-Modifier  Never done   COLONOSCOPY (Pts 45-42yrs Insurance coverage will need to be confirmed)  Never done   MAMMOGRAM  04/06/2010   Zoster Vaccines- Shingrix (1 of 2) Never done   COVID-19 Vaccine (3 - Booster for Pfizer series) 11/30/2019   INFLUENZA VACCINE  12/23/2020    There are no preventive care reminders to display for this patient.  No results found for: TSH Lab Results  Component Value Date   WBC 5.3 10/16/2020   HGB 14.3 10/16/2020   HCT 42.5 10/16/2020   MCV 97.0 10/16/2020   PLT 194.0 10/16/2020   Lab Results  Component Value Date   NA 139 10/16/2020   K 4.1 10/16/2020   CO2 38 (H) 10/16/2020   GLUCOSE 80 10/16/2020   BUN 11 10/16/2020   CREATININE 0.73 10/16/2020   BILITOT 0.2 (L) 08/03/2019   ALKPHOS 85 08/03/2019   AST 18 08/03/2019   ALT 23 08/03/2019   PROT 6.4 (L) 08/03/2019   ALBUMIN 3.2 (L) 08/03/2019   CALCIUM 9.5 10/16/2020   ANIONGAP 9 05/09/2020   GFR 89.32 10/16/2020   Lab Results  Component Value Date   CHOL 181 07/31/2019   Lab Results  Component Value Date   HDL 38 (L)  07/31/2019   Lab Results  Component Value Date   LDLCALC 125 (H) 07/31/2019   Lab Results  Component Value Date   TRIG 92 07/31/2019   Lab Results  Component Value Date   CHOLHDL 4.8 07/31/2019   Lab Results  Component Value Date   HGBA1C 5.4 03/19/2021      Assessment & Plan:   Problem List Items Addressed This Visit       Cardiovascular and Mediastinum   Essential hypertension   Relevant Orders   CBC with Differential/Platelet   Comp. Metabolic Panel (12)   TSH     Respiratory   COPD exacerbation (HCC)   Relevant Medications   albuterol (VENTOLIN HFA) 108 (90 Base) MCG/ACT inhaler   fluticasone-salmeterol (ADVAIR) 250-50 MCG/ACT AEPB     Other   Depression - Primary   Prediabetes   Relevant Orders   HgB A1c (Completed)   Other Visit Diagnoses     Encounter for HCV screening test for low risk patient       Relevant Orders   HCV Ab w Reflex to Quant PCR   Screening, lipid       Relevant Orders   Lipid panel       Meds ordered this encounter  Medications   albuterol (VENTOLIN HFA) 108 (90 Base) MCG/ACT inhaler    Sig: Inhale 2 puffs into the lungs every 6 (six) hours  as needed for wheezing or shortness of breath.    Dispense:  8 g    Refill:  5    Order Specific Question:   Supervising Provider    Answer:   Storm Frisk [[id number]]   fluticasone-salmeterol (ADVAIR) 250-50 MCG/ACT AEPB    Sig: Inhale 1 puff into the lungs 2 (two) times daily.    Dispense:  60 each    Refill:  6    Order Specific Question:   Supervising Provider    Answer:   Delford Field, PATRICK E [[id number]]  1. Depression, unspecified depression type Patient strongly encouraged to follow-up with behavioral health provider.  Patient education given on supportive care.  Patient encouraged to return to this provider at the mobile medicine unit if unable to follow-up with her behavioral health provider in a timely manner.  Patient understands and agrees.  Patient education given on [facility] financial assistance in the event that her insurance is canceled.  Red flags given for prompt reevaluation  Patient to return for fasting labs in 1 day  Patient given appointment to [facility]  2. Chronic obstructive pulmonary disease, unspecified COPD type (HCC) Trial Advair, prescription sent to [facility] to help with financial constraints, continue albuterol.  Patient education given on supportive care and red flags for prompt reevaluation - albuterol (VENTOLIN HFA) 108 (90 Base) MCG/ACT inhaler; Inhale 2 puffs into the lungs every 6 (six) hours as needed for wheezing or shortness of breath.  Dispense: 8 g; Refill: 5 - fluticasone-salmeterol (ADVAIR) 250-50 MCG/ACT AEPB; Inhale 1 puff into the lungs 2 (two) times daily.  Dispense: 60 each; Refill: 6  3. Essential hypertension Patient has not taken blood pressure medication in several months, blood pressure today is within normal limits.  Patient encouraged to check blood pressure at home, keep a written log and have available for all office visits. - CBC with Differential/Platelet; Future - Comp. Metabolic Panel (12); Future - TSH; Future  4. Prediabetes Patient was previously on metformin 500 mg twice a day, has been out of this medication for several months.  A1c 5.4.  No need to restart medication at this time - HgB A1c  5. Encounter for HCV screening test for low risk patient  - HCV Ab w Reflex to Quant PCR; Future  6. Screening, lipid  - Lipid panel; Future   I have reviewed the patient's medical history (PMH, PSH, Social History, Family History, Medications, and allergies) , and have been updated if relevant. I spent 30 minutes reviewing chart and  face to face time with patient.     Follow-up: Return in about 1 day (around 03/20/2021) for Fasting  labs, At [facility].    Kasandra Knudsen Mayers, PA-C

## 2021-03-19 NOTE — Patient Instructions (Signed)
I have sent a prescription for Advair daily inhaler, hopefully this is going to be more affordable for you, it is preferred by your insurance.  I strongly encourage you to follow-up with behavioral health for medication management as soon as possible.  Your A1C was 5.4. You do not need to restart metformin at this time.  We will call you with today's lab results.  Roney Jaffe, PA-C Physician Assistant [facility] [email or web address]   Chronic Obstructive Pulmonary Disease Chronic obstructive pulmonary disease (COPD) is a long-term (chronic) condition that affects the lungs. COPD is a general term that can be used to describe many different lung problems that cause lung inflammation and limit airflow, including chronic bronchitis and emphysema. If you have COPD, your lung function will probably never return to normal. In most cases, it gets worse over time. However, there are steps you can take to slow the progression of the disease and improve your quality of life. What are the causes? This condition may be caused by: Smoking. This is the most common cause. Certain genes passed down through families. What increases the risk? The following factors may make you more likely to develop this condition: Being exposed to secondhand smoke from cigarettes, pipes, or cigars. Being exposed to chemicals and other irritants, such as fumes and dust in the work environment. Having chronic lung conditions or infections. What are the signs or symptoms? Symptoms of this condition include: Shortness of breath, especially during physical activity. Chronic cough with a large amount of thick mucus. Sometimes, the cough may not have any mucus (dry cough). Wheezing and rapid breathing. Gray or bluish discoloration (cyanosis) of the skin, especially in the fingers, toes, or lips. Feeling tired (fatigue). Weight loss. Chest tightness. Frequent  infections. Episodes when breathing symptoms become much worse (exacerbations). At the later stages of this disease, you may have swelling in the ankles, feet, or legs. How is this diagnosed? This condition is diagnosed based on: Your medical history. A physical exam. You may also have tests, including: Lung (pulmonary) function tests. This may include a spirometry test, which measures your ability to exhale properly. Chest X-ray. CT scan. Blood tests. How is this treated? This condition may be treated with: Medicines. These may include inhaled rescue medicines to treat acute exacerbations as well as medicines that you take long-term (maintenance medicines) to prevent flare-ups of COPD. Bronchodilators help treat COPD by dilating the airways to allow increased airflow and make your breathing more comfortable. Steroids can reduce airway inflammation and help prevent exacerbations. Smoking cessation. If you smoke, your health care provider may ask you to quit, and may also recommend therapy or replacement products to help you quit. Pulmonary rehabilitation. This may involve working with a team of health care providers and specialists, such as respiratory, occupational, and physical therapists. Exercise and physical activity. These are beneficial for nearly all people with COPD. Nutrition therapy to gain weight, if you are underweight. Oxygen. Supplemental oxygen therapy is only helpful if you have a low oxygen level in your blood (hypoxemia). Lung surgery or transplant. Palliative care. This is to help people with COPD feel comfortable when treatment is no longer working. Follow these instructions at home: Medicines Take over-the-counter and prescription medicines only as told by your health care provider. This includes inhaled medicines and pills. Talk to your health care provider before taking any cough or allergy medicines. You may need to avoid certain medicines that dry out your  airways. Lifestyle If you smoke,  the most important thing that you can do is to stop smoking. Continuing to smoke will cause the disease to progress faster. Do not use any products that contain nicotine or tobacco. These products include cigarettes, chewing tobacco, and vaping devices, such as e-cigarettes. If you need help quitting, ask your health care provider. Avoid exposure to things that irritate your lungs, such as smoke, chemicals, and fumes. Stay active, but balance activity with periods of rest. Exercise and physical activity will help you maintain your ability to do things you want to do. Learn and use relaxation techniques to manage stress and to control your breathing. Get the right amount of sleep and get quality sleep. Most adults need 7 or more hours per night. Eat healthy foods. Eating smaller, more frequent meals and resting before meals may help you maintain your strength. Controlled breathing Learn and use controlled breathing techniques as directed by your health care provider. Controlled breathing techniques include: Pursed lip breathing. Start by breathing in (inhaling) through your nose for 1 second. Then, purse your lips as if you were going to whistle and breathe out (exhale) through the pursed lips for 2 seconds. Diaphragmatic breathing. Start by putting one hand on your abdomen just above your waist. Inhale slowly through your nose. The hand on your abdomen should move out. Then purse your lips and exhale slowly. You should be able to feel the hand on your abdomen moving in as you exhale.  Controlled coughing Learn and use controlled coughing to clear mucus from your lungs. Controlled coughing is a series of short, progressive coughs. The steps of controlled coughing are: Lean your head slightly forward. Breathe in deeply using diaphragmatic breathing. Try to hold your breath for 3 seconds. Keep your mouth slightly open while coughing twice. Spit any mucus out into a  tissue. Rest and repeat the steps once or twice as needed. General instructions Make sure you receive all the vaccines that your health care provider recommends, especially the pneumococcal and influenza vaccines. Preventing infection and hospitalization is very important when you have COPD. Drink enough fluid to keep your urine pale yellow, unless you have a medical condition that requires fluid restriction. Use oxygen therapy and pulmonary rehabilitation if told by your health care provider. If you require home oxygen therapy, ask your health care provider whether you should purchase a pulse oximeter to measure your oxygen level at home. Work with your health care provider to develop a COPD action plan. This will help you know what steps to take if your condition gets worse. Keep other chronic health conditions under control as told by your health care provider. Avoid extreme temperature and humidity changes. Avoid contact with people who have an illness that spreads from person to person (is contagious), such as viral infections or pneumonia. Keep all follow-up visits. This is important. Contact a health care provider if: You are coughing up more mucus than usual. There is a change in the color or thickness of your mucus. Your breathing is more labored than usual. Your breathing is faster than usual. You have difficulty sleeping. You need to use your rescue medicines or inhalers more often than expected. You have trouble doing routine activities such as getting dressed or walking around the house. Get help right away if: You have shortness of breath while you are resting. You have shortness of breath that prevents you from: Being able to talk. Performing your usual physical activities. You have chest pain lasting longer than 5 minutes. Your  skin color is more blue (cyanotic) than usual. You measure low oxygen saturations for longer than 5 minutes with a pulse oximeter. You have a  fever. You feel too tired to breathe normally. These symptoms may represent a serious problem that is an emergency. Do not wait to see if the symptoms will go away. Get medical help right away. Call your local emergency services (911 in the [place] Do not drive yourself to the hospital. Summary Chronic obstructive pulmonary disease (COPD) is a long-term (chronic) condition that affects the lungs. Your lung function will probably never return to normal. In most cases, it gets worse over time. However, there are steps you can take to slow the progression of the disease and improve your quality of life. Treatment for COPD may include taking medicines, quitting smoking, pulmonary rehabilitation, and changes to diet and exercise. As the disease progresses, you may need oxygen therapy, a lung transplant, or palliative care. To help manage your condition, do not smoke, avoid exposure to things that irritate your lungs, stay up to date on all vaccines, and follow your health care provider's instructions for taking medicines. This information is not intended to replace advice given to you by your health care provider. Make sure you discuss any questions you have with your health care provider. Document Revised: 03/19/2020 Document Reviewed: 03/19/2020 Elsevier Patient Education  2022 [employer].

## 2021-03-21 ENCOUNTER — Ambulatory Visit: Payer: Managed Care, Other (non HMO)

## 2021-03-21 DIAGNOSIS — F32A Depression, unspecified: Secondary | ICD-10-CM | POA: Insufficient documentation

## 2021-03-21 DIAGNOSIS — R7303 Prediabetes: Secondary | ICD-10-CM | POA: Insufficient documentation

## 2021-03-21 DIAGNOSIS — I10 Essential (primary) hypertension: Secondary | ICD-10-CM | POA: Insufficient documentation

## 2021-03-26 ENCOUNTER — Other Ambulatory Visit: Payer: Self-pay

## 2021-04-06 ENCOUNTER — Other Ambulatory Visit: Payer: Self-pay | Admitting: Family Medicine

## 2021-04-06 DIAGNOSIS — I10 Essential (primary) hypertension: Secondary | ICD-10-CM

## 2021-04-06 NOTE — Telephone Encounter (Signed)
Requested medication (s) are due for refill today: Yes  Requested medication (s) are on the active medication list: Yes  Last refill:  07/01/20 #30/6RF  Future visit scheduled: No  Notes to clinic:  pt hasn't been taking BP medication per OV note on 03/19/21, please advise.      Requested Prescriptions  Pending Prescriptions Disp Refills   amLODipine (NORVASC) 10 MG tablet [Pharmacy Med Name: amLODIPine Besylate 10 MG Oral Tablet] 30 tablet 0    Sig: Take 1 tablet by mouth once daily     Cardiovascular:  Calcium Channel Blockers Passed - 04/06/2021  9:04 AM      Passed - Last BP in normal range    BP Readings from Last 1 Encounters:  03/19/21 110/81          Passed - Valid encounter within last 6 months    Recent Outpatient Visits           2 weeks ago Depression, unspecified depression type   [facility] Mayers, Cari S, PA-C   9 months ago Acute midline low back pain without sciatica   [facility] [place], Odette Horns, MD   1 year ago Acute respiratory failure with hypoxia [facility])   [facility] [place], [place], [place]   1 year ago Acute right lower quadrant pain   [place] [facility] Fulp, [place], MD   2 years ago Type 2 diabetes mellitus with hyperglycemia, without long-term current use of insulin [facility])   [facility] [place], [place], [place]

## 2021-04-11 ENCOUNTER — Other Ambulatory Visit: Payer: Self-pay | Admitting: Physician Assistant

## 2021-04-11 ENCOUNTER — Ambulatory Visit: Payer: Self-pay

## 2021-04-11 DIAGNOSIS — F32A Depression, unspecified: Secondary | ICD-10-CM

## 2021-04-11 NOTE — Telephone Encounter (Signed)
Medication Refill - Medication: FLUoxetine (PROZAC) 20 MG capsule  Has the patient contacted their pharmacy? Yes.   Pt was told to call her dr Preferred Pharmacy (with phone number or street name): [id number] - [place] ([place]), [place] - [address]  Has the patient been seen for an appointment in the last year OR does the patient have an upcoming appointment? Yes.    Pt saw Malissa Hippo on 10/26. Pt states she needs this medication asap  Agent: Please be advised that RX refills may take up to 3 business days. We ask that you follow-up with your pharmacy.

## 2021-04-11 NOTE — Telephone Encounter (Signed)
Pt states she is having neck spasms down into her back for 3 days.  She is wanting muscle relaxer.  Please advise as no appts this afternoon at [facility]   Left message to call back.

## 2021-04-11 NOTE — Telephone Encounter (Signed)
Requested medications are due for refill today yes  Requested medications are on the active medication list yes  Last visit 03/19/21  Future visit scheduled 04/14/21  Notes to clinic No PCP listed, also did not return for ordered labs after last appt. He does have upcoming appt, please assess.  Requested Prescriptions  Pending Prescriptions Disp Refills   FLUoxetine (PROZAC) 20 MG capsule 60 capsule 0    Sig: Take 2 capsules (40 mg total) by mouth daily. OFFICE VISIT NEEDED FOR ADDITIONAL REFILLS     Psychiatry:  Antidepressants - SSRI Passed - 04/11/2021  3:21 PM      Passed - Completed PHQ-2 or PHQ-9 in the last 360 days      Passed - Valid encounter within last 6 months    Recent Outpatient Visits           3 weeks ago Depression, unspecified depression type   [place] [address] [facility] [place], Cari S, PA-C   9 months ago Acute midline low back pain without sciatica   [facility] [place], Odette Horns, MD   1 year ago Acute respiratory failure with hypoxia [facility])   [facility] [place], [place], [place]   1 year ago Acute right lower quadrant pain    [facility] [place], [place], [place]   2 years ago Type 2 diabetes mellitus with hyperglycemia, without long-term current use of insulin [facility])   [facility] [place], [place], [place]

## 2021-04-11 NOTE — Telephone Encounter (Signed)
Patient returned call and reports neck pain right side x 3 days. Pain comes and goes and "knot" on right side of neck noted with spasms and then goes away. Radiates to back. Denies neck injury or activity causing pain. Has not tried OTC medications or heat . Denies fever, chest pain , difficulty breathing or dizziness , headache. No weakness on either side. No swelling of neck. Reports "knot" noted with spasms occur. Recommended OTC medications and to go to UC or ED  due to no PCP. Recommended to go online My Chart for E visit. Offered patient information to access My Chart.  Care advise given. Patient verbalized understanding of care advise and to go to [facility] or ED for worsening symptoms.    Reason for Disposition  [1] MODERATE neck pain (e.g., interferes with normal activities) AND [2] present > 3 days  Answer Assessment - Initial Assessment Questions 1. ONSET: "When did the pain begin?"      3 days ago  2. LOCATION: "Where does it hurt?"      Right side of neck  3. PATTERN "Does the pain come and go, or has it been constant since it started?"      Comes and goes  4. SEVERITY: "How bad is the pain?"  (Scale 1-10; or mild, moderate, severe)   - NO PAIN (0): no pain or only slight stiffness    - MILD (1-3): doesn't interfere with normal activities    - MODERATE (4-7): interferes with normal activities or awakens from sleep    - SEVERE (8-10):  excruciating pain, unable to do any normal activities      Moderate to severe 5. RADIATION: "Does the pain go anywhere else, shoot into your arms?"     Into back  6. CORD SYMPTOMS: "Any weakness or numbness of the arms or legs?"     No  7. CAUSE: "What do you think is causing the neck pain?"     Not sure  8. NECK OVERUSE: "Any recent activities that involved turning or twisting the neck?"     No  9. OTHER SYMPTOMS: "Do you have any other symptoms?" (e.g., headache, fever, chest pain, difficulty breathing, neck swelling)     No  10. PREGNANCY: "Is there  any chance you are pregnant?" "When was your last menstrual period?"       na  Protocols used: Neck Pain or Stiffness-A-AH

## 2021-04-14 ENCOUNTER — Ambulatory Visit: Payer: Managed Care, Other (non HMO)

## 2021-04-14 MED ORDER — FLUOXETINE HCL 20 MG PO CAPS: 40.0000 mg | ORAL_CAPSULE | Freq: Every day | ORAL | 0 refills | Status: DC

## 2021-05-29 ENCOUNTER — Ambulatory Visit: Payer: Self-pay | Admitting: *Deleted

## 2021-05-29 NOTE — Telephone Encounter (Signed)
°  Summary: gum disease, gum pain   Patient experiencing throbbing and pain for 2 days, states she was dx with gum disease and states she feels a sour taste in her mouth like puss. Dentist recommended a procedure but patient can not afford it. Patient seeking clinical advice        Chief Complaint: requesting antibiotics for gum pain  Symptoms: gum pain , due to gum disease  Frequency: x 2 months  Pertinent Negatives: Patient denies na  Disposition: [x] ED /[x] Urgent Care (no appt availability in office) / [] Appointment(In office/virtual)/ [x]  [facility]/ [] Home Care/ [] Refused Recommended Disposition /[] [facility]/ []  Follow-up with PCP Additional Notes:  Attempted to review free dental clinics and patient only wants prescribed antibiotics for gum pain . Recommended UC / ED due to no PCP or trying My Chart E visit. Patient hung up phone .        Reason for Disposition  [1] SEVERE mouth pain (e.g., excruciating) AND [2] not improved after 2 hours of pain medicine  Answer Assessment - Initial Assessment Questions 1. ONSET: "When did the mouth start hurting?" (e.g., hours or days ago)      Gum pain x 2 months ago  2. SEVERITY: "How bad is the pain?" (Scale 1-10; mild, moderate or severe)   - MILD (1-3):  doesn't interfere with eating or normal activities   - MODERATE (4-7): interferes with eating some solids and normal activities   - SEVERE (8-10):  excruciating pain, interferes with most normal activities   - SEVERE DYSPHAGIA: can't swallow liquids, drooling     Sour taste in mouth  3. SORES: "Are there any sores or ulcers in the mouth?" If Yes, ask: "What part of the mouth are the sores in?"     Dx with gum disease 4. FEVER: "Do you have a fever?" If Yes, ask: "What is your temperature, how was it measured, and when did it start?"     na 5. CAUSE: "What do you think is causing the mouth pain?"     Gum disease  6. OTHER SYMPTOMS: "Do you have any other  symptoms?" (e.g., difficulty breathing)     Na  Protocols used: Mouth Pain-A-AH

## 2021-05-31 ENCOUNTER — Other Ambulatory Visit: Payer: Self-pay | Admitting: Critical Care Medicine

## 2021-05-31 DIAGNOSIS — F32A Depression, unspecified: Secondary | ICD-10-CM

## 2021-05-31 DIAGNOSIS — I10 Essential (primary) hypertension: Secondary | ICD-10-CM

## 2021-05-31 NOTE — Telephone Encounter (Signed)
Requested medication (s) are due for refill today: yes   Requested medication (s) are on the active medication list: yes  Last refill:  amlodipine: 04/07/21 #30             Prozac: 04/14/21 #60  Future visit scheduled: no Notes to clinic:  Called pt and LM on VM to call and make appt. Last appt was canceled and did not return for labs for BP med as ordered. Advised pt that Prozac cannot be refilled until she makes an office visit. Advised pt that she also needs to get lab work as well. Advised will forward Prozac order to provider. CH[facility] call back number provided.   Requested Prescriptions  Pending Prescriptions Disp Refills   amLODipine (NORVASC) 10 MG tablet [Pharmacy Med Name: amLODIPine Besylate 10 MG Oral Tablet] 30 tablet 0    Sig: Take 1 tablet by mouth once daily     Cardiovascular:  Calcium Channel Blockers Passed - 05/31/2021  2:03 PM      Passed - Last BP in normal range    BP Readings from Last 1 Encounters:  03/19/21 110/81          Passed - Valid encounter within last 6 months    Recent Outpatient Visits           2 months ago Depression, unspecified depression type   [facility] [place], Cari S, [place]   11 months ago Acute midline low back pain without sciatica   [facility] [place], Odette Horns, [place]   1 year ago Acute respiratory failure with hypoxia [facility])   [facility] [place], [place], [place]   1 year ago Acute right lower quadrant pain   [facility] [place], [place], [place]   3 years ago Type 2 diabetes mellitus with hyperglycemia, without long-term current use of insulin [facility])   [facility] [place], Marylene [place], [place]               FLUoxetine (PROZAC) 20 MG capsule [Pharmacy Med Name: FLUoxetine HCl 20 MG Oral Capsule] 60 capsule 0    Sig: TAKE 2 CAPSULES BY MOUTH ONCE DAILY . APPOINTMENT REQUIRED FOR FUTURE REFILLS      Psychiatry:  Antidepressants - SSRI Passed - 05/31/2021  2:03 PM      Passed - Completed PHQ-2 or PHQ-9 in the last 360 days      Passed - Valid encounter within last 6 months    Recent Outpatient Visits           2 months ago Depression, unspecified depression type   [place] [address] [facility] [place], Cari S, PA-C   11 months ago Acute midline low back pain without sciatica   [facility] [place], Odette Horns, MD   1 year ago Acute respiratory failure with hypoxia [facility])   [facility] [place], [place], [place]   1 year ago Acute right lower quadrant pain   [facility] Fulp, [place], [place]   3 years ago Type 2 diabetes mellitus with hyperglycemia, without long-term current use of insulin [facility])   [facility] [place], [place], [place]

## 2021-06-07 ENCOUNTER — Emergency Department (HOSPITAL_COMMUNITY): Payer: Managed Care, Other (non HMO)

## 2021-06-07 ENCOUNTER — Encounter (HOSPITAL_COMMUNITY): Payer: Self-pay | Admitting: Emergency Medicine

## 2021-06-07 ENCOUNTER — Emergency Department (HOSPITAL_COMMUNITY)
Admission: EM | Admit: 2021-06-07 | Discharge: 2021-06-07 | Disposition: A | Discharge status: Home / Self Care | Payer: Managed Care, Other (non HMO) | Attending: Emergency Medicine | Admitting: Emergency Medicine

## 2021-06-07 DIAGNOSIS — J449 Chronic obstructive pulmonary disease, unspecified: Secondary | ICD-10-CM | POA: Insufficient documentation

## 2021-06-07 DIAGNOSIS — E119 Type 2 diabetes mellitus without complications: Secondary | ICD-10-CM | POA: Insufficient documentation

## 2021-06-07 DIAGNOSIS — I10 Essential (primary) hypertension: Secondary | ICD-10-CM | POA: Diagnosis not present

## 2021-06-07 DIAGNOSIS — M25562 Pain in left knee: Secondary | ICD-10-CM | POA: Insufficient documentation

## 2021-06-07 DIAGNOSIS — Z7984 Long term (current) use of oral hypoglycemic drugs: Secondary | ICD-10-CM | POA: Diagnosis not present

## 2021-06-07 DIAGNOSIS — Z79899 Other long term (current) drug therapy: Secondary | ICD-10-CM | POA: Insufficient documentation

## 2021-06-07 DIAGNOSIS — Z7951 Long term (current) use of inhaled steroids: Secondary | ICD-10-CM | POA: Insufficient documentation

## 2021-06-07 NOTE — Discharge Instructions (Addendum)
You came to the emergency department today to be evaluated for your left knee pain.  The x-ray obtained showed no broken bones or dislocations.  Your physical exam is reassuring.  You declined receiving an ultrasound to definitively rule out a blood clot to your lower leg.  Please take Ibuprofen (Advil, motrin) and Tylenol (acetaminophen) to relieve your pain.    You may take up to 600 MG (3 pills) of normal strength ibuprofen every 8 hours as needed.   You make take tylenol, up to 1,000 mg (two extra strength pills) every 8 hours as needed.   It is safe to take ibuprofen and tylenol at the same time as they work differently.   Do not take more than 3,000 mg tylenol in a 24 hour period (not more than one dose every 8 hours.  Please check all medication labels as many medications such as pain and cold medications may contain tylenol.  Do not drink alcohol while taking these medications.  Do not take other NSAID'S while taking ibuprofen (such as aleve or naproxen).  Please take ibuprofen with food to decrease stomach upset.  Get help right away if: Your knee swells, and the swelling gets worse. You cannot move your knee. You have very bad knee pain that does not get better with pain medicine. You develop numbness or weakness to your leg You develop swelling or redness to your leg

## 2021-06-07 NOTE — ED Provider Notes (Signed)
[facility] DEPT Provider Note   CSN: [id number] Arrival date & time: 06/07/21  0901     History  Chief Complaint  Patient presents with   Leg Pain    Marissa Owens is a 61 y.o. female with past medical history of hypertension, diabetes mellitus, COPD.  Presents to the emergency department with a chief complaint of pain to right popliteal area.  Patient states that pain has been present over the last 3 days.  Pain has been constant over this time.  Patient has had no change in pain over this time.  At present patient rates pain 7/10 on the pain scale.  Patient states that pain is worse when standing and laying down.  Patient states that pain is improved when moving her leg.  Patient has taken Tylenol with some improvement in her pain.  Patient denies any recent falls or traumatic injuries.  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  Patient states that she had to call out of work yesterday due to her discomfort and needs a note for work.  Patient denies any numbness, weakness, color change, wounds, leg swelling, hemoptysis, shortness of breath, chest pain.  Patient denies any history of DVT or PE.   Leg Pain Associated symptoms: no back pain, no fever and no neck pain       Home Medications Prior to Admission medications   Medication Sig Start Date End Date Taking? Authorizing Provider  albuterol (PROVENTIL) (2.5 MG/3ML) 0.083% nebulizer solution Take 3 mLs (2.5 mg total) by nebulization every 6 (six) hours as needed for wheezing or shortness of breath. 09/11/20   Spero Geralds, MD  albuterol (VENTOLIN HFA) 108 (90 Base) MCG/ACT inhaler Inhale 2 puffs into the lungs every 6 (six) hours as needed for wheezing or shortness of breath. 03/19/21   Mayers, Cari S, PA-C  amLODipine (NORVASC) 10 MG tablet Take 1 tablet by mouth once daily 06/02/21   Charlott Rakes, MD  divalproex (DEPAKOTE ER) 500 MG 24 hr tablet  Take 1,000 mg by mouth at bedtime. 02/13/21   [provider]  FLUoxetine (PROZAC) 20 MG capsule TAKE 2 CAPSULES BY MOUTH ONCE DAILY . APPOINTMENT REQUIRED FOR FUTURE REFILLS 06/02/21   Charlott Rakes, MD  fluticasone-salmeterol (ADVAIR) 250-50 MCG/ACT AEPB Inhale 1 puff into the lungs 2 (two) times daily. 03/19/21   Mayers, Cari S, PA-C  hydrocortisone (ANUSOL-HC) 25 MG suppository Place 1 suppository (25 mg total) rectally 2 (two) times daily. 09/21/20   Petrucelli, Samantha R, PA-C  metFORMIN (GLUCOPHAGE) 500 MG tablet Take 1 tablet (500 mg total) by mouth 2 (two) times daily with a meal. 07/01/20   Charlott Rakes, MD  polyethylene glycol (MIRALAX) 17 g packet Take 17 g by mouth daily as needed for moderate constipation. 09/21/20   Petrucelli, Glynda Jaeger, PA-C  Respiratory Therapy Supplies (ADULT MASK) MISC Needs an mask for her nebulizer machine 07/03/19   Fulp, Cammie, MD  risperiDONE (RISPERDAL) 2 MG tablet Take 1 tablet (2 mg total) by mouth 2 (two) times daily. 01/10/19   Raylene Everts, MD      Allergies    Patient has no known allergies.    Review of Systems   Review of Systems  Constitutional:  Negative for chills and fever.  Eyes:  Negative for visual disturbance.  Respiratory:  Negative for shortness of breath.   Cardiovascular:  Negative for chest pain.  Gastrointestinal:  Negative  for abdominal pain, nausea and vomiting.  Genitourinary:  Negative for difficulty urinating and dysuria.  Musculoskeletal:  Positive for arthralgias. Negative for back pain, gait problem, joint swelling and neck pain.  Skin:  Negative for color change, pallor, rash and wound.  Neurological:  Negative for dizziness, syncope, light-headedness and headaches.  Psychiatric/Behavioral:  Negative for confusion.    Physical Exam Updated Vital Signs BP (!) 137/101 (BP Location: Left Arm)    Pulse 71    Temp 98.2 F (36.8 C) (Oral)    Resp 18    SpO2 95%  Physical Exam Vitals and nursing note  reviewed.  Constitutional:      General: She is not in acute distress.    Appearance: She is not ill-appearing, toxic-appearing or diaphoretic.  HENT:     Head: Normocephalic.  Eyes:     General: No scleral icterus.       Right eye: No discharge.        Left eye: No discharge.  Cardiovascular:     Rate and Rhythm: Normal rate.     Pulses:          Dorsalis pedis pulses are 2+ on the right side and 2+ on the left side.  Pulmonary:     Effort: Pulmonary effort is normal.  Musculoskeletal:     Right upper leg: Normal.     Left upper leg: Normal.     Right knee: No swelling, deformity, effusion, erythema, ecchymosis, lacerations, bony tenderness or crepitus. Normal range of motion. No tenderness. Normal alignment.     Left knee: No swelling, deformity, effusion, erythema, ecchymosis, lacerations, bony tenderness or crepitus. Normal range of motion. Tenderness present. Normal alignment.     Right lower leg: Normal.     Left lower leg: Normal.     Right ankle: No swelling, deformity, ecchymosis or lacerations. No tenderness. Normal range of motion.     Left ankle: No swelling, deformity, ecchymosis or lacerations. No tenderness. Normal range of motion.     Right foot: Normal range of motion and normal capillary refill. No swelling, deformity, laceration, tenderness, bony tenderness or crepitus. Normal pulse.     Left foot: Normal range of motion and normal capillary refill. No swelling, deformity, laceration, tenderness, bony tenderness or crepitus. Normal pulse.     Comments: Minimal tenderness to popliteal area  Feet:     Right foot:     Skin integrity: Dry skin present. No ulcer, blister, skin breakdown, erythema, warmth, callus or fissure.     Toenail Condition: Right toenails are normal.     Left foot:     Skin integrity: Dry skin present. No ulcer, blister, skin breakdown, erythema, warmth, callus or fissure.     Toenail Condition: Left toenails are normal.  Skin:    General: Skin  is warm and dry.  Neurological:     General: No focal deficit present.     Mental Status: She is alert.  Psychiatric:        Behavior: Behavior is cooperative.    ED Results / Procedures / Treatments   Labs (all labs ordered are listed, but only abnormal results are displayed) Labs Reviewed - No data to display  EKG None  Radiology DG Knee Complete 4 Views Left  Result Date: 06/07/2021 CLINICAL DATA:  Pain behind the left knee for 3 days. EXAM: LEFT KNEE - COMPLETE 4+ VIEW COMPARISON:  None. FINDINGS: No evidence of fracture, dislocation, or joint narrowing. Borderline knee joint effusion. IMPRESSION: No  acute finding or degenerative joint narrowing. Electronically Signed   By: Jorje Guild M.D.   On: 06/07/2021 09:58    Procedures Procedures    Medications Ordered in ED Medications - No data to display  ED Course/ Medical Decision Making/ A&P                           Medical Decision Making  This patient presents to the ED for concern of pain to left knee, this involves an extensive number of treatment options, and is a complaint that carries with it a high risk of complications and morbidity.  The differential diagnosis includes not limited to fracture, dislocation, DVT, Baker's cyst, cellulitis, septic arthritis   Co morbidities that complicate the patient evaluation  Diabetes mellitus, hypertension   Additional history obtained:  External records from outside source obtained and reviewed including lab results   Imaging Studies ordered:  I ordered imaging studies including x-ray imaging left knee I independently visualized and interpreted imaging which showed no acute osseous abnormality I agree with the radiologist interpretation   Test Considered:  Ultrasound imaging left lower extremity Imaging was considered due to patient's tenderness to rule out DVT.  Discussed obtaining this imaging with patient however she declines imaging at this  time.  Problem List / ED Course:  Left knee pain Pain to popliteal area x3 days.  No known traumatic injuries or falls. Patient has no swelling or tenderness to left knee.  Has full range of motion without the complains of pain or difficulty.  Pulse, motor, and sensation intact distally. X-ray imaging shows no acute osseous abnormality. Due to tenderness concern for possible Baker's cyst versus DVT however low suspicion due to no swelling.  Had plan to obtain ultrasound imaging to rule out DVT however patient declines this imaging at this time. Will give patient information to follow-up with orthopedic provider.  Patient given information for symptomatic relief.   Reevaluation:  After the interventions noted above, I reevaluated the patient and found that they have :stayed the same  Disposition:  After consideration of the diagnostic results and the patients response to treatment, I feel that the patent would benefit from discharge and follow-up with orthopedic provider.          Final Clinical Impression(s) / ED Diagnoses Final diagnoses:  Acute pain of left knee    Rx / DC Orders ED Discharge Orders     None         Loni Beckwith, PA-C 06/07/21 1719    Lucrezia Starch, MD 06/08/21 [phone]

## 2021-06-07 NOTE — ED Triage Notes (Signed)
Patient c/o pain behind left knee x3 days. Denies injury. Reports a lot of standing. Ambulatory.

## 2021-06-07 NOTE — ED Provider Triage Note (Signed)
Emergency Medicine Provider Triage Evaluation Note  Marissa Owens , a 61 y.o. female  was evaluated in triage.  Pt complains of posterior left knee pain of 3-day duration.  She denies injury.  She denies fever, chills.  Denies history of similar symptoms in bilateral knees couple years ago that lasted about a week, she did not seek evaluation.  Review of Systems  Positive: As above Negative: As above  Physical Exam  BP (!) 143/109    Pulse 80    Temp 98.1 F (36.7 C) (Oral)    Resp 16    SpO2 95%  Gen:   Awake, no distress   Resp:  Normal effort  MSK:   Moves extremities without difficulty  Other:  Tenderness to palpation present over the popliteal fossa.  Knee joint without bony tenderness.  Full extension and flexion present in the knee.  Unable to evaluate knee fully in triage given patient's clothing.   Medical Decision Making  Medically screening exam initiated at 9:53 AM.  Appropriate orders placed.  Marissa Owens was informed that the remainder of the evaluation will be completed by another provider, this initial triage assessment does not replace that evaluation, and the importance of remaining in the ED until their evaluation is complete.     Marita [place], PA-C 06/07/21 [phone]

## 2021-07-25 ENCOUNTER — Other Ambulatory Visit: Payer: Self-pay | Admitting: Family Medicine

## 2021-07-25 DIAGNOSIS — I10 Essential (primary) hypertension: Secondary | ICD-10-CM

## 2021-07-25 NOTE — Telephone Encounter (Signed)
Requested Prescriptions  ?Pending Prescriptions Disp Refills  ?? amLODipine (NORVASC) 10 MG tablet [Pharmacy Med Name: amLODIPine Besylate 10 MG Oral Tablet] 30 tablet 0  ?  Sig: Take 1 tablet by mouth once daily  ?  ? Cardiovascular: Calcium Channel Blockers 2 Failed - 07/25/2021  2:00 PM  ?  ?  Failed - Last BP in normal range  ?  BP Readings from Last 1 Encounters:  ?06/07/21 (!) 137/101  ?   ?  ?  Passed - Last Heart Rate in normal range  ?  Pulse Readings from Last 1 Encounters:  ?06/07/21 71  ?   ?  ?  Passed - Valid encounter within last 6 months  ?  Recent Outpatient Visits   ?      ? 4 months ago Depression, unspecified depression type  ? [place], [place]  ? 1 year ago Acute midline low back pain without sciatica  ? [place], Charlane Ferretti, [place]  ? 1 year ago Acute respiratory failure with hypoxia ([place])  ? [place], [place], [place]  ? 2 years ago Acute right lower quadrant pain  ? [place] Fulp, [place], [place]  ? 3 years ago Type 2 diabetes mellitus with hyperglycemia, without long-term current use of insulin ([place])  ? [place], [place], [place]  ?  ?  ? ?  ?  ?  ? ? ?

## 2021-09-30 ENCOUNTER — Ambulatory Visit
Admission: EM | Admit: 2021-09-30 | Discharge: 2021-09-30 | Disposition: A | Discharge status: Home / Self Care | Payer: Managed Care, Other (non HMO) | Attending: Urgent Care | Admitting: Urgent Care

## 2021-09-30 DIAGNOSIS — J00 Acute nasopharyngitis [common cold]: Secondary | ICD-10-CM | POA: Diagnosis not present

## 2021-09-30 DIAGNOSIS — J441 Chronic obstructive pulmonary disease with (acute) exacerbation: Secondary | ICD-10-CM

## 2021-09-30 MED ORDER — QVAR REDIHALER 80 MCG/ACT IN AERB: 1.0000 | INHALATION_SPRAY | Freq: Two times a day (BID) | RESPIRATORY_TRACT | 0 refills | Status: DC

## 2021-09-30 MED ORDER — IPRATROPIUM-ALBUTEROL 0.5-2.5 (3) MG/3ML IN SOLN
3.0000 mL | Freq: Once | RESPIRATORY_TRACT | Status: AC
  Administered 2021-09-30: 3 mL via RESPIRATORY_TRACT

## 2021-09-30 MED ORDER — IPRATROPIUM-ALBUTEROL 20-100 MCG/ACT IN AERS: 1.0000 | INHALATION_SPRAY | Freq: Four times a day (QID) | RESPIRATORY_TRACT | 0 refills | Status: DC | PRN

## 2021-09-30 MED ORDER — AMOXICILLIN-POT CLAVULANATE 875-125 MG PO TABS: 1.0000 | ORAL_TABLET | Freq: Two times a day (BID) | ORAL | 0 refills | Status: AC

## 2021-09-30 MED ORDER — PREDNISONE 50 MG PO TABS: 50.0000 mg | ORAL_TABLET | Freq: Every day | ORAL | 0 refills | Status: DC

## 2021-09-30 NOTE — ED Triage Notes (Signed)
Pt states hx of copd. C/o nonproductive cough/congestion and runny nose for the past 3 days. ?

## 2021-09-30 NOTE — Discharge Instructions (Addendum)
You were tested today for COVID.  Please do not return to work until the results are back.  If positive they will need to further quarantine. ?Please purchase over-the-counter Flonase to help with your runny nose. ?Please start taking prednisone 1 tablet daily in the mornings to prevent insomnia. ?Please start taking Augmentin twice daily with food to prevent an upset stomach ?Start taking Qvar 1 puff twice daily as a COPD prevention medication. ?Start using Combivent every 6 hours around-the-clock for the first 72 hours, then on an as-needed basis. ?Please follow-up with your pulmonologist and schedule an appointment with a PCP as soon as possible. ? ?

## 2021-09-30 NOTE — ED Provider Notes (Signed)
?[facility] ? ? ? ?CSN: [id number] ?Arrival date & time: 09/30/21  1728 ? ? ?  ? ?History   ?Chief Complaint ?Chief Complaint  ?Patient presents with  ? Cough  ? ? ?HPI ?Marissa Owens is a 61 y.o. female.  ? ?Pleasant 61yo female with a known hx of COPD and tobacco abuse presents today due to a 3 day hx of worsening SOB and coughing. She states it is a dry cough. 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 She also endorses copious amounts of clear rhinorrhea. Denies hx of seasonal allergies. Denies known covid contact. No fever, ear pain or sinus pressure, but does admit to post nasal drainage. Pt had scripts for Advair in the past but states they were too expensive so never filled them. She has a nebulizer machine at home, but has not been using it. She ran out of her rescue inhaler "a while back." Denies any chest pain, edema, PND, orthopnea.  ? ? ? ?Cough ? ?Past Medical History:  ?Diagnosis Date  ? COPD (chronic obstructive pulmonary disease) (HCC)   ? Diabetes mellitus without complication (HCC)   ? Hypertension   ? Renal disorder   ? kidney stone  ? ? ?Patient Active Problem List  ? Diagnosis Date Noted  ? Depression 03/21/2021  ? Essential hypertension 03/21/2021  ? Prediabetes 03/21/2021  ? Leg swelling 10/16/2020  ? Acute respiratory failure (HCC) 07/13/2017  ? Tobacco abuse 07/13/2017  ? COPD exacerbation (HCC) 07/11/2017  ? ? ?Past Surgical History:  ?Procedure Laterality Date  ? TUBAL LIGATION    ? ? ?OB History   ?No obstetric history on file. ?  ? ? ? ?Home Medications   ? ?Prior to Admission medications   ?Medication Sig Start Date End Date Taking? Authorizing Provider  ?amoxicillin-clavulanate (AUGMENTIN) 875-125 MG tablet Take 1 tablet by mouth 2 (two) times daily for 7 days. 09/30/21 10/07/21 Yes Stacye Noori L, PA  ?beclomethasone (QVAR REDIHALER) 80 MCG/ACT inhaler Inhale 1 puff into the lungs 2 (two) times daily.  09/30/21  Yes Zurich Carreno L, PA  ?Ipratropium-Albuterol (COMBIVENT) 20-100 MCG/ACT AERS respimat Inhale 1 puff into the lungs every 6 (six) hours as needed for wheezing or shortness of breath. 09/30/21  Yes Daci Stubbe L, PA  ?predniSONE (DELTASONE) 50 MG tablet Take 1 tablet (50 mg total) by mouth daily with breakfast. 09/30/21  Yes Jodiann Ognibene L, PA  ?albuterol (PROVENTIL) (2.5 MG/3ML) 0.083% nebulizer solution Take 3 mLs (2.5 mg total) by nebulization every 6 (six) hours as needed for wheezing or shortness of breath. 09/11/20   Charlott Holleresai, Nikita S, MD  ?amLODipine (NORVASC) 10 MG tablet Take 1 tablet by mouth once daily 07/25/21   Margarita MailAndrews, Elisabeth, DO  ?divalproex (DEPAKOTE ER) 500 MG 24 hr tablet Take 1,000 mg by mouth at bedtime. 02/13/21   [provider]  ?Respiratory Therapy Supplies (ADULT MASK) MISC Needs an mask for her nebulizer machine 07/03/19   Fulp, Cammie, MD  ?risperiDONE (RISPERDAL) 2 MG tablet Take 1 tablet (2 mg total) by mouth 2 (two) times daily. 01/10/19   Eustace MooreNelson, Yvonne Sue, MD  ? ? ?Family History ?Family History  ?Problem Relation Age of Onset  ? Cancer Mother   ? Hypertension Mother   ? Lung cancer Brother   ? ? ?Social History ?Social History  ? ?Tobacco Use  ? Smoking status: Every Day  ?  Packs/day:  1.00  ?  Types: Cigarettes  ? Smokeless tobacco: Never  ? Tobacco comments:  ?  30  ?Vaping Use  ? Vaping Use: Never used  ?Substance Use Topics  ? Alcohol use: No  ? Drug use: No  ? ? ? ?Allergies   ?Patient has no known allergies. ? ? ?Review of Systems ?Review of Systems  ?Respiratory:  Positive for cough.   ?As per hpi ? ?Physical Exam ?Triage Vital Signs ?ED Triage Vitals [09/30/21 1827]  ?Enc Vitals Group  ?   BP (!) 147/96  ?   Pulse Rate 84  ?   Resp 18  ?   Temp 97.9 ?F (36.6 ?C)  ?   Temp Source Oral  ?   SpO2 93 %  ?   Weight   ?   Height   ?   Head Circumference   ?   Peak Flow   ?   Pain Score 0  ?   Pain Loc   ?   Pain Edu?   ?   Excl. in GC?   ? ?No data found. ? ?Updated  Vital Signs ?BP (!) 147/96 (BP Location: Left Arm)   Pulse 84   Temp 97.9 ?F (36.6 ?C) (Oral)   Resp 18   SpO2 93%  ? ?Visual Acuity ?Right Eye Distance:   ?Left Eye Distance:   ?Bilateral Distance:   ? ?Right Eye Near:   ?Left Eye Near:    ?Bilateral Near:    ? ?Physical Exam ?Vitals and nursing note reviewed.  ?Constitutional:   ?   General: She is not in acute distress. ?   Appearance: Normal appearance. She is normal weight. She is not toxic-appearing or diaphoretic.  ?HENT:  ?   Head: Normocephalic and atraumatic.  ?   Right Ear: Tympanic membrane, ear canal and external ear normal. There is no impacted cerumen.  ?   Left Ear: Tympanic membrane, ear canal and external ear normal. There is no impacted cerumen.  ?   Nose: Rhinorrhea (copious, clear) present.  ?   Mouth/Throat:  ?   Mouth: Mucous membranes are moist.  ?   Pharynx: Oropharynx is clear.  ?   Comments: Poor dentition ?Eyes:  ?   Extraocular Movements: Extraocular movements intact.  ?   Conjunctiva/sclera: Conjunctivae normal.  ?   Pupils: Pupils are equal, round, and reactive to light.  ?Cardiovascular:  ?   Rate and Rhythm: Normal rate and regular rhythm.  ?   Pulses: Normal pulses.  ?   Heart sounds: Normal heart sounds. No murmur heard. ?Pulmonary:  ?   Effort: Pulmonary effort is normal. No respiratory distress.  ?   Breath sounds: No stridor. Wheezing (bibasilar, improved s/p neb) present. No rhonchi or rales.  ?Chest:  ?   Chest wall: Tenderness present.  ?Musculoskeletal:  ?   Cervical back: Normal range of motion and neck supple. No rigidity or tenderness.  ?   Right lower leg: No edema.  ?   Left lower leg: No edema.  ?Lymphadenopathy:  ?   Cervical: No cervical adenopathy.  ?Skin: ?   General: Skin is warm.  ?   Coloration: Skin is not jaundiced.  ?   Findings: No erythema or rash.  ?Neurological:  ?   General: No focal deficit present.  ?   Mental Status: She is alert and oriented to person, place, and time.  ? ? ? ?UC Treatments /  Results  ?Labs ?(all labs  ordered are listed, but only abnormal results are displayed) ?Labs Reviewed  ?NOVEL CORONAVIRUS, NAA  ? ? ?EKG ? ? ?Radiology ?No results found. ? ?Procedures ?Procedures (including critical care time) ? ?Medications Ordered in UC ?Medications  ?ipratropium-albuterol (DUONEB) 0.5-2.5 (3) MG/3ML nebulizer solution 3 mL (3 mLs Nebulization Given 09/30/21 1852)  ? ? ?Initial Impression / Assessment and Plan / UC Course  ?I have reviewed the triage vital signs and the nursing notes. ? ?Pertinent labs & imaging results that were available during my care of the patient were reviewed by me and considered in my medical decision making (see chart for details). ? ?Clinical Course as of 10/03/21 2220  ?Tue Sep 30, 2021  ?1920 97% recheck s/p neb [WC]  ?  ?Clinical Course User Index ?[WC] Guy Sandifer L, PA  ? ? ?COPD exacerbation - pt not taking her rescue or controller medications, primarily due to cost. Thorough review shows that combivent and Qvar should be approved Tier one through her insurance. Improvement in symptoms and O2 stats noted after neb in office.  Start augmenting and prednisone PO to aid with current flare, combivent Q 6 hours around the clock x 3 days, then PRN. Qvar start for daily preventative use after flare resolved. Please call and schedule follow up with PCP ?Rhinitis - due to amount, covid test obtained. Will call with results. Nasal saline or flonase may help ? ?Final Clinical Impressions(s) / UC Diagnoses  ? ?Final diagnoses:  ?COPD exacerbation (HCC)  ?Acute rhinitis  ? ? ? ?Discharge Instructions   ? ?  ?You were tested today for COVID.  Please do not return to work until the results are back.  If positive they will need to further quarantine. ?Please purchase over-the-counter Flonase to help with your runny nose. ?Please start taking prednisone 1 tablet daily in the mornings to prevent insomnia. ?Please start taking Augmentin twice daily with food to prevent an upset  stomach ?Start taking Qvar 1 puff twice daily as a COPD prevention medication. ?Start using Combivent every 6 hours around-the-clock for the first 72 hours, then on an as-needed basis. ?Please follow-up with your pu

## 2021-10-01 LAB — NOVEL CORONAVIRUS, NAA: SARS-CoV-2, NAA: NOT DETECTED

## 2022-01-04 ENCOUNTER — Other Ambulatory Visit: Payer: Self-pay | Admitting: Internal Medicine

## 2022-01-04 DIAGNOSIS — I10 Essential (primary) hypertension: Secondary | ICD-10-CM

## 2022-01-05 NOTE — Telephone Encounter (Signed)
Requested medication (s) are due for refill today: yes  Requested medication (s) are on the active medication list: yes  Last refill:  07/25/21  Future visit scheduled: no  Notes to clinic:  Attempted to call x1 but unable to LM on VM   Requested Prescriptions  Pending Prescriptions Disp Refills   amLODipine (NORVASC) 10 MG tablet [Pharmacy Med Name: amLODIPine Besylate 10 MG Oral Tablet] 90 tablet 0    Sig: Take 1 tablet by mouth once daily     Cardiovascular: Calcium Channel Blockers 2 Failed - 01/04/2022  3:23 PM      Failed - Last BP in normal range    BP Readings from Last 1 Encounters:  09/30/21 (!) 147/96         Failed - Valid encounter within last 6 months    Recent Outpatient Visits           9 months ago Depression, unspecified depression type   [facility] [place], Cari S, PA-C   1 year ago Acute midline low back pain without sciatica   [place] [place], [place], [place]   2 years ago Acute respiratory failure with hypoxia [facility])   [place] [facility] [place], [place], [place]   2 years ago Acute right lower quadrant pain   [facility] [place], [place], [place]   3 years ago Type 2 diabetes mellitus with hyperglycemia, without long-term current use of insulin [facility])    [facility] [place], [place], [place]              Passed - Last Heart Rate in normal range    Pulse Readings from Last 1 Encounters:  09/30/21 84

## 2022-01-06 ENCOUNTER — Other Ambulatory Visit: Payer: Self-pay | Admitting: Pharmacist

## 2022-01-06 DIAGNOSIS — I10 Essential (primary) hypertension: Secondary | ICD-10-CM

## 2022-01-06 MED ORDER — AMLODIPINE BESYLATE 10 MG PO TABS: 10.0000 mg | ORAL_TABLET | Freq: Every day | ORAL | 0 refills | Status: DC
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
## 2022-02-06 ENCOUNTER — Ambulatory Visit: Payer: Managed Care, Other (non HMO) | Attending: Internal Medicine | Admitting: Internal Medicine

## 2022-02-06 ENCOUNTER — Encounter: Payer: Self-pay | Admitting: Internal Medicine

## 2022-02-06 VITALS — BP 150/90 | HR 76 | Temp 98.5°F | Ht 62.0 in | Wt 159.8 lb

## 2022-02-06 DIAGNOSIS — F411 Generalized anxiety disorder: Secondary | ICD-10-CM | POA: Diagnosis not present

## 2022-02-06 DIAGNOSIS — R7303 Prediabetes: Secondary | ICD-10-CM

## 2022-02-06 DIAGNOSIS — F17200 Nicotine dependence, unspecified, uncomplicated: Secondary | ICD-10-CM

## 2022-02-06 DIAGNOSIS — Z23 Encounter for immunization: Secondary | ICD-10-CM

## 2022-02-06 DIAGNOSIS — I10 Essential (primary) hypertension: Secondary | ICD-10-CM | POA: Diagnosis not present

## 2022-02-06 DIAGNOSIS — R350 Frequency of micturition: Secondary | ICD-10-CM

## 2022-02-06 DIAGNOSIS — J449 Chronic obstructive pulmonary disease, unspecified: Secondary | ICD-10-CM | POA: Diagnosis not present

## 2022-02-06 DIAGNOSIS — F320 Major depressive disorder, single episode, mild: Secondary | ICD-10-CM

## 2022-02-06 DIAGNOSIS — F17210 Nicotine dependence, cigarettes, uncomplicated: Secondary | ICD-10-CM | POA: Diagnosis not present

## 2022-02-06 DIAGNOSIS — Z1211 Encounter for screening for malignant neoplasm of colon: Secondary | ICD-10-CM

## 2022-02-06 LAB — POCT URINALYSIS DIP (CLINITEK)
Bilirubin, UA: NEGATIVE
Blood, UA: NEGATIVE
Glucose, UA: NEGATIVE mg/dL
Ketones, POC UA: NEGATIVE mg/dL
Nitrite, UA: NEGATIVE
POC PROTEIN,UA: NEGATIVE
Spec Grav, UA: >=1.03 — AB (ref 1.010–1.025)
Urobilinogen, UA: 1 E.U./dL
pH, UA: 6 (ref 5.0–8.0)

## 2022-02-06 MED ORDER — QVAR REDIHALER 80 MCG/ACT IN AERB: 1.0000 | INHALATION_SPRAY | Freq: Two times a day (BID) | RESPIRATORY_TRACT | 3 refills | Status: DC

## 2022-02-06 MED ORDER — AMLODIPINE BESYLATE 10 MG PO TABS: 10.0000 mg | ORAL_TABLET | Freq: Every day | ORAL | 0 refills | Status: DC

## 2022-02-06 MED ORDER — TIOTROPIUM BROMIDE MONOHYDRATE 18 MCG IN CAPS: 18.0000 ug | ORAL_CAPSULE | Freq: Every day | RESPIRATORY_TRACT | 12 refills | Status: DC

## 2022-02-06 NOTE — Progress Notes (Signed)
Patient ID: Marissa Owens, female    DOB: 1961/02/06  MRN: [id number]  CC: Hypertension   Subjective: Marissa Owens is a 61 y.o. female who presents for f/u visit Her concerns today include:  Patient with history of HTN, COPD, tobacco dependence, pre-DM, Dep/anx  Pt c/o frequent urination x 1 mth.   Occurring all day.  Drinks about 4 bottles of [employer] a day.  Does not drink water regularly.  No dry mouth No dysuria No urinary incontinence She has history of prediabetes.  HTN: BP elev today. On Norvasc 10 mg but does not think she took it today Has home BP device but not checking BP  Trying to limit salt in foods.    "My nerves is bad." Was seeing Dr Alcario Drought but stopped going.  Last seen 2 mths ago.  Reports her phone number has change and she did not call to let his office know; had planned to reach out to them this wk. she had been seeing him for about a year.  Reports she was being treated for depression, mood swings and anxiety.  She is on Zoloft 100 mg daily, Risperdal and Depakote.  She finds the medications to be helpful.  Denies any suicidal ideation.  Tob dep: smoking 1/2 pk a day.  Not ready to quit.  Smoked since age 65 and never quit since that time.  Hx of COPD.  Out of inhalers.  She was on Qvar and Albuterol.  She has been using an inhaler several times a day that her sister gave her since running out of her own.  She does not know the name of the inhaler that her sister gave her.  Endorses dry cough.  + wheezing.  Uses the inhaler 2x/night.    HD:  due for flu, Tdapt and Shinglers vaccine.  Due for pap and colon CA screen.  Patient Active Problem List   Diagnosis Date Noted   Depression 03/21/2021   Essential hypertension 03/21/2021   Prediabetes 03/21/2021   Leg swelling 10/16/2020   Acute respiratory failure (HCC) 07/13/2017   Tobacco abuse 07/13/2017   COPD exacerbation (HCC) 07/11/2017     Current Outpatient Medications on File Prior to Visit   Medication Sig Dispense Refill   albuterol (PROVENTIL) (2.5 MG/3ML) 0.083% nebulizer solution Take 3 mLs (2.5 mg total) by nebulization every 6 (six) hours as needed for wheezing or shortness of breath. 75 mL 12   amLODipine (NORVASC) 10 MG tablet Take 1 tablet (10 mg total) by mouth daily. 30 tablet 0   beclomethasone (QVAR REDIHALER) 80 MCG/ACT inhaler Inhale 1 puff into the lungs 2 (two) times daily. 1 each 0   divalproex (DEPAKOTE ER) 500 MG 24 hr tablet Take 1,000 mg by mouth at bedtime.     Ipratropium-Albuterol (COMBIVENT) 20-100 MCG/ACT AERS respimat Inhale 1 puff into the lungs every 6 (six) hours as needed for wheezing or shortness of breath. 4 g 0   predniSONE (DELTASONE) 50 MG tablet Take 1 tablet (50 mg total) by mouth daily with breakfast. 5 tablet 0   Respiratory Therapy Supplies (ADULT MASK) MISC Needs an mask for her nebulizer machine 1 each 0   risperiDONE (RISPERDAL) 2 MG tablet Take 1 tablet (2 mg total) by mouth 2 (two) times daily. 60 tablet 2   No current facility-administered medications on file prior to visit.    No Known Allergies  Social History   Socioeconomic History   Marital status: Single  Spouse name: Not on file   Number of children: Not on file   Years of education: Not on file   Highest education level: Not on file  Occupational History   Not on file  Tobacco Use   Smoking status: Every Day    Packs/day: 1.00    Types: Cigarettes   Smokeless tobacco: Never   Tobacco comments:    30  Vaping Use   Vaping Use: Never used  Substance and Sexual Activity   Alcohol use: No   Drug use: No   Sexual activity: Not Currently  Other Topics Concern   Not on file  Social History Narrative   Not on file   Social Determinants of Health   Financial Resource Strain: Not on file  Food Insecurity: Not on file  Transportation Needs: Not on file  Physical Activity: Not on file  Stress: Not on file  Social Connections: Not on file  Intimate Partner  Violence: Not on file    Family History  Problem Relation Age of Onset   Cancer Mother    Hypertension Mother    Lung cancer Brother     Past Surgical History:  Procedure Laterality Date   TUBAL LIGATION      ROS: Review of Systems Negative except as stated above  PHYSICAL EXAM: BP (!) 153/102   Pulse 76   Temp 98.5 F (36.9 C) (Oral)   Ht 5\' 2"  (1.575 m)   Wt 159 lb 12.8 oz (72.5 kg)   SpO2 96%   BMI 29.23 kg/m   Wt Readings from Last 3 Encounters:  02/06/22 159 lb 12.8 oz (72.5 kg)  03/19/21 148 lb (67.1 kg)  10/16/20 168 lb (76.2 kg)    Physical Exam  General appearance - alert, well appearing, older African-American female and in no distress Mental status -patient a bit forgetful. Neck - supple, no significant adenopathy Chest -breath sounds mildly decreased but without wheezes or crackles. Heart - normal rate, regular rhythm, normal S1, S2, no murmurs, rubs, clicks or gallops Extremities -no lower extremity edema.  Some venous stasis changes in the distal lower legs.     02/06/2022    3:25 PM 03/19/2021    2:08 PM 08/10/2019    5:01 PM 04/14/2018   10:15 AM  Depression screen PHQ 2/9  Decreased Interest 2 3 0 3  Down, Depressed, Hopeless 2 3 2 3   PHQ - 2 Score 4 6 2 6   Altered sleeping 2 2 0 2  Tired, decreased energy 3 2 1 2   Change in appetite 3 3  2   Feeling bad or failure about yourself  1 3 1 2   Trouble concentrating 0 1 0 1  Moving slowly or fidgety/restless 0 0 0 1  Suicidal thoughts 0 0 0 1  PHQ-9 Score 13 17 4 17   Difficult doing work/chores   Somewhat difficult       02/06/2022    3:26 PM 03/19/2021    2:08 PM 04/14/2018   10:15 AM  GAD 7 : Generalized Anxiety Score  Nervous, Anxious, on Edge 2 3 2   Control/stop worrying 3 3 2   Worry too much - different things 3 3 3   Trouble relaxing 2 2 2   Restless 2 3 1   Easily annoyed or irritable 2 3 2   Afraid - awful might happen 3 3 3   Total GAD 7 Score 17 20 15         Latest Ref Rng  & Units 10/16/2020  11:21 AM 05/09/2020    3:44 AM 08/03/2019    2:13 AM  CMP  Glucose 70 - 99 mg/dL 80  924  268   BUN 6 - 23 mg/dL 11  11  16    Creatinine 0.40 - 1.20 mg/dL  3.41  9.62   Sodium 135 - 145 mEq/L 139  142  138   Potassium 3.5 - 5.1 mEq/L 4.1  4.0  3.4   Chloride 96 - 112 mEq/L 98  104  98   CO2 19 - 32 mEq/L 38  29  28   Calcium 8.4 - 10.5 mg/dL 9.5  9.2  9.0   Total Protein 6.5 - 8.1 g/dL   6.4   Total Bilirubin 0.3 - 1.2 mg/dL   0.2   Alkaline Phos 38 - 126 U/L   85   AST 15 - 41 U/L   18   ALT 0 - 44 U/L   23    Lipid Panel     Component Value Date/Time   CHOL 181 07/31/2019 0901   TRIG 92 07/31/2019 0901   HDL 38 (L) 07/31/2019 0901   CHOLHDL 4.8 07/31/2019 0901   VLDL 18 07/31/2019 0901   LDLCALC 125 (H) 07/31/2019 0901    CBC    Component Value Date/Time   WBC 5.3 10/16/2020 1121   RBC 4.38 10/16/2020 1121   HGB 14.3 10/16/2020 1121   HCT 42.5 10/16/2020 1121   PLT 194.0 10/16/2020 1121   MCV 97.0 10/16/2020 1121   MCH 32.0 05/09/2020 0344   MCHC 33.7 10/16/2020 1121   RDW 13.6 10/16/2020 1121   LYMPHSABS 2.0 10/16/2020 1121   MONOABS 0.8 10/16/2020 1121   EOSABS 0.2 10/16/2020 1121   BASOSABS 0.1 10/16/2020 1121   Results for orders placed or performed in visit on 02/06/22  POCT URINALYSIS DIP (CLINITEK)  Result Value Ref Range   Color, UA yellow yellow   Clarity, UA clear clear   Glucose, UA negative negative mg/dL   Bilirubin, UA negative negative   Ketones, POC UA negative negative mg/dL   Spec Grav, UA 02/08/22 (A) 1.010 - 1.025   Blood, UA negative negative   pH, UA 6.0 5.0 - 8.0   POC PROTEIN,UA negative negative, trace   Urobilinogen, UA 1.0 0.2 or 1.0 E.U./dL   Nitrite, UA Negative Negative   Leukocytes, UA Trace (A) Negative    ASSESSMENT AND PLAN: 1. Frequent urination UA looks okay. Encouraged her to cut back on the [facility] Dew/caffeine.  Try to drink more water. We will check A1c to make sure she has not  progressed to full diabetes. - POCT URINALYSIS DIP (CLINITEK)  2. Essential hypertension Not at goal but patient has not taken Norvasc as yet for the day.  She will take it as soon as she returns home. - amLODipine (NORVASC) 10 MG tablet; Take 1 tablet (10 mg total) by mouth daily.  Dispense: 30 tablet; Refill: 0 - CBC - Comprehensive metabolic panel - Lipid panel  3. Chronic obstructive pulmonary disease, unspecified COPD type (HCC) Encouraged her to quit smoking.  She is not ready to give a trial of quitting. We will try her on Spiriva. Refill sent on Qvar.  I wanted to put her on Symbicort but patient states that even with co-pay a lot of the inhalers are out of reach for her budget - tiotropium (SPIRIVA HANDIHALER) 18 MCG inhalation capsule; Place 1 capsule (18 mcg total) into inhaler and inhale daily.  Dispense:  30 capsule; Refill: 12 - beclomethasone (QVAR REDIHALER) 80 MCG/ACT inhaler; Inhale 1 puff into the lungs 2 (two) times daily.  Dispense: 1 each; Refill: 3  4. Tobacco dependence Advised to quit.  Discussed health risks associated with smoking.  Patient not ready to give a trial of quitting.  5. Prediabetes Patient advised to eliminate sugary drinks from the diet, cut back on portion sizes especially of white carbohydrates, eat more white lean meat like chicken [place] and seafood instead of beef or pork and incorporate fresh fruits and vegetables into the diet daily.  - Hemoglobin A1c  6. Major depressive disorder, single episode, mild (HCC) 7. Generalized anxiety disorder Encourage patient to call Dr. Maggie Schwalbe office next week and schedule her follow-up appointment.  She agrees to do so.  8. Need for Tdap vaccination Given today.  9. Screening for colon cancer Discussed colon cancer screening methods.  Patient prefers to have colonoscopy - Ambulatory referral to Gastroenterology  10. Need for immunization against influenza - Flu Vaccine QUAD 58mo+IM (Fluarix, Fluzone &  Alfiuria Quad PF)    Patient was given the opportunity to ask questions.  Patient verbalized understanding of the plan and was able to repeat key elements of the plan.   This documentation was completed using Paediatric nurse.  Any transcriptional errors are unintentional.  Orders Placed This Encounter  Procedures   POCT URINALYSIS DIP (CLINITEK)     Requested Prescriptions    No prescriptions requested or ordered in this encounter    No follow-ups on file.  Jonah Blue, MD, FACP

## 2022-02-07 LAB — COMPREHENSIVE METABOLIC PANEL
ALT: 14 IU/L (ref 0–32)
AST: 21 IU/L (ref 0–40)
Albumin/Globulin Ratio: 1.3 (ref 1.2–2.2)
Albumin: 4.8 g/dL (ref 3.9–4.9)
Alkaline Phosphatase: 96 IU/L (ref 44–121)
BUN/Creatinine Ratio: 12 (ref 12–28)
BUN: 10 mg/dL (ref 8–27)
Bilirubin Total: 0.5 mg/dL (ref 0.0–1.2)
CO2: 30 mmol/L — ABNORMAL HIGH (ref 20–29)
Calcium: 9.9 mg/dL (ref 8.7–10.3)
Chloride: 101 mmol/L (ref 96–106)
Creatinine, Ser: 0.82 mg/dL (ref 0.57–1.00)
Globulin, Total: 3.7 g/dL (ref 1.5–4.5)
Glucose: 90 mg/dL (ref 70–99)
Potassium: 4.4 mmol/L (ref 3.5–5.2)
Sodium: 144 mmol/L (ref 134–144)
Total Protein: 8.5 g/dL (ref 6.0–8.5)
eGFR: 81 mL/min/{1.73_m2} (ref >59)

## 2022-02-07 LAB — LIPID PANEL
Chol/HDL Ratio: 4.6 ratio — ABNORMAL HIGH (ref 0.0–4.4)
Cholesterol, Total: 201 mg/dL — ABNORMAL HIGH (ref 100–199)
HDL: 44 mg/dL (ref >39)
LDL Chol Calc (NIH): 137 mg/dL — ABNORMAL HIGH (ref 0–99)
Triglycerides: 110 mg/dL (ref 0–149)
VLDL Cholesterol Cal: 20 mg/dL (ref 5–40)

## 2022-02-07 LAB — HEMOGLOBIN A1C
Est. average glucose Bld gHb Est-mCnc: 126 mg/dL
Hgb A1c MFr Bld: 6 % — ABNORMAL HIGH (ref 4.8–5.6)

## 2022-02-07 LAB — CBC
Hematocrit: 48.6 % — ABNORMAL HIGH (ref 34.0–46.6)
Hemoglobin: 16 g/dL — ABNORMAL HIGH (ref 11.1–15.9)
MCH: 31.6 pg (ref 26.6–33.0)
MCHC: 32.9 g/dL (ref 31.5–35.7)
MCV: 96 fL (ref 79–97)
Platelets: 203 10*3/uL (ref 150–450)
RBC: 5.06 x10E6/uL (ref 3.77–5.28)
RDW: 12 % (ref 11.7–15.4)
WBC: 7.8 10*3/uL (ref 3.4–10.8)

## 2022-02-08 ENCOUNTER — Other Ambulatory Visit: Payer: Self-pay | Admitting: Internal Medicine

## 2022-02-08 DIAGNOSIS — I10 Essential (primary) hypertension: Secondary | ICD-10-CM

## 2022-02-08 MED ORDER — ATORVASTATIN CALCIUM 10 MG PO TABS: 10.0000 mg | ORAL_TABLET | Freq: Every day | ORAL | 1 refills | Status: DC

## 2022-02-08 MED ORDER — AMLODIPINE BESYLATE 10 MG PO TABS: 10.0000 mg | ORAL_TABLET | Freq: Every day | ORAL | 2 refills | Status: DC

## 2022-02-09 ENCOUNTER — Telehealth: Payer: Self-pay

## 2022-02-09 NOTE — Telephone Encounter (Signed)
Pt. Given results and instructions. Verbalizes understanding. 

## 2022-02-25 ENCOUNTER — Encounter: Payer: Self-pay | Admitting: Internal Medicine

## 2022-02-26 ENCOUNTER — Encounter: Payer: Self-pay | Admitting: Pharmacist

## 2022-02-26 ENCOUNTER — Ambulatory Visit: Payer: Managed Care, Other (non HMO) | Attending: Family Medicine | Admitting: Pharmacist

## 2022-02-26 VITALS — BP 122/82 | HR 72

## 2022-02-26 DIAGNOSIS — I10 Essential (primary) hypertension: Secondary | ICD-10-CM

## 2022-02-26 NOTE — Progress Notes (Signed)
   S:     No chief complaint on file.  Marissa Owens is a 61 y.o. female who presents for hypertension evaluation, education, and management.  PMH is significant for HTN, COPD, tobacco use, depression, preDM.  Patient was referred and last seen by Dr. Wynetta Emery on 02/06/2022.   At last visit, BP was 150/90 having not taken her amlodipine.   Today, patient arrives in good spirits and presents without assistance. Denies dizziness, headache, blurred vision, swelling.   Patient reports hypertension is longstanding.   Family/Social history:  -Fhx: HTN -Tobacco: current 1 PPD smoker  -Alcohol: none reported   Medication adherence reported.   Current antihypertensives include: amlodipine 10 mg  Reported home BP readings:  -Brings home log. Uses wrist cuff and checks mostly before checking BP at home. -SBP range as follows: 130 - 152 -DBP range as follows: 91 - 114  Patient reported dietary habits:  -Admits to eating a lot of sodium. Seasons food with salt.  -Admits to drinking caffeine-containing sodas all throughout the day.   Patient-reported exercise habits:  - No formal exercise regimen reported.   O:  Vitals:   02/26/22 1115  BP: 122/82  Pulse: 72   Last 3 Office BP readings: BP Readings from Last 3 Encounters:  02/26/22 122/82  02/06/22 (!) 150/90  09/30/21 (!) 147/96   BMET    Component Value Date/Time   NA 144 02/06/2022 1628   K 4.4 02/06/2022 1628   CL 101 02/06/2022 1628   CO2 30 (H) 02/06/2022 1628   GLUCOSE 90 02/06/2022 1628   GLUCOSE 80 10/16/2020 1121   BUN 10 02/06/2022 1628   CREATININE 0.82 02/06/2022 1628   CALCIUM 9.9 02/06/2022 1628   GFRNONAA >60 05/09/2020 0344   GFRAA >60 08/03/2019 6440    Renal function: CrCl cannot be calculated (Unknown ideal weight.).  Clinical ASCVD: No  The 10-year ASCVD risk score (Arnett DK, et al., 2019) is: 13.7%   Values used to calculate the score:     Age: 43 years     Sex: Female     Is  Non-Hispanic African American: Yes     Diabetic: No     Tobacco smoker: Yes     Systolic Blood Pressure: 347 mmHg     Is BP treated: Yes     HDL Cholesterol: 44 mg/dL     Total Cholesterol: 201 mg/dL  A/P: Hypertension longstanding currently uncontrolled on current medications. BP goal < 130/80 mmHg. Medication adherence appears appropriate.  -Continued current regimen for now.  -Patient educated on purpose, proper use, and potential adverse effects of amlodipine. -Counseled on proper BP technique at home. Encouraged her to check after BP meds.   -Counseled on lifestyle modifications for blood pressure control including reduced dietary sodium, increased exercise, adequate sleep. -Encouraged patient to check BP at home and bring log of readings to next visit. Counseled on proper use of home BP cuff.    Results reviewed and written information provided.    Written patient instructions provided. Patient verbalized understanding of treatment plan.  Total time in face to face counseling 30 minutes.    Follow-up:  Pharmacist in 1 month.  Benard Halsted, PharmD, [place], [place] [phone]

## 2022-04-09 ENCOUNTER — Ambulatory Visit: Payer: Self-pay | Admitting: Family Medicine

## 2022-04-23 ENCOUNTER — Ambulatory Visit: Payer: Managed Care, Other (non HMO) | Admitting: Pharmacist

## 2022-05-01 ENCOUNTER — Ambulatory Visit: Payer: Self-pay | Admitting: *Deleted

## 2022-05-01 NOTE — Telephone Encounter (Signed)
She is requesting a note from Dr. Alvis Lemmings to be out of work for today and possibly tomorrow since she doesn't feel good.   She was ok with Dr. Alvis Lemmings sending it via MyChart.   I sent my notes to [facility] for Dr. Alvis Lemmings.

## 2022-05-01 NOTE — Telephone Encounter (Signed)
Done

## 2022-05-01 NOTE — Telephone Encounter (Signed)
  Chief Complaint: dizzy and lightheaded when in her house due to mold exposure.   Trying to get the landlord to do something about it but it will be Monday before they come inspect her house.    She's trying to stay with a friend until the mold is evaluated and removed, if possible Symptoms: Nasal congestion and dizziness and lightheadedness when she is in her house Frequency: When she is in her house Pertinent Negatives: Patient denies N/A Disposition: [] ED /[] Urgent Care (no appt availability in office) / [] Appointment(In office/virtual)/ []  [place] [facility]/ [] Home Care/ [] Refused Recommended Disposition /[x] [place] [facility]/ []  Follow-up with PCP Additional Notes: She is going to the [facility] unit in [facility] on [address].    She was agreeable to going to the ED if needed.    I made her an appt. With Dr. for Feb. 2024 to get her on the schedule since she is booked out so far.     She is requesting a note from Dr. to be out of work for today and possibly tomorrow since she doesn't feel good.   She was ok with Dr. [facility] sending it via MyChart.   I sent my notes to [facility] for Dr. Cameri.

## 2022-05-01 NOTE — Telephone Encounter (Signed)
Reason for Disposition  [1] MILD dizziness (e.g., walking normally) AND [2] has NOT been evaluated by doctor (or NP/PA) for this  (Exception: Dizziness caused by heat exposure, sudden standing, or poor fluid intake.)    Exposure to mold causing her to be dizzy  Answer Assessment - Initial Assessment Questions 1. DESCRIPTION: "Describe your dizziness."     I've been lightheaded for last couple of days.   I'm congested.   I need my landlord to come check my place for mold.   I'm so allergic to the mold.    My job wants a dr. Note to say I can be out.  They are supposed to come on Monday to look at my living space to look for mold.   I'm sure that's what is making me feel so bad.   I'm very allergic to mold. Something is going on with me.   Every time I talk with my landlord they don't do anything.    They aren't doing anything.     My work needs a note for me to be out of work.   I need it for today and tomorrow .  I might be able to go in tomorrow to work depending on how I feel.     I couldn't get in today to see Dr. Alvis Lemmings because of no transportation.   I had to miss my appt.    I trying to contact a friend so I can stay at her place so I don't have to stay in there with the mold.  2. LIGHTHEADED: "Do you feel lightheaded?" (e.g., somewhat faint, woozy, weak upon standing)     Yes every time I go into where I live I don't feel good. 3. VERTIGO: "Do you feel like either you or the room is spinning or tilting?" (i.e. vertigo)     Not asked 4. SEVERITY: "How bad is it?"  "Do you feel like you are going to faint?" "Can you stand and walk?"   - MILD: Feels slightly dizzy, but walking normally.   - MODERATE: Feels unsteady when walking, but not falling; interferes with normal activities (e.g., school, work).   - SEVERE: Unable to walk without falling, or requires assistance to walk without falling; feels like passing out now.      Mild 5. ONSET:  "When did the dizziness begin?"     I have  congestion and feeling dizzy and lightheaded when I'm home. 6. AGGRAVATING FACTORS: "Does anything make it worse?" (e.g., standing, change in head position)     Being around the mold in her living area 7. HEART RATE: "Can you tell me your heart rate?" "How many beats in 15 seconds?"  (Note: not all patients can do this)       N/A 8. CAUSE: "What do you think is causing the dizziness?"     Exposure to the mold where I live. 9. RECURRENT SYMPTOM: "Have you had dizziness before?" If Yes, ask: "When was the last time?" "What happened that time?"     Yes   When I'm exposed to mold 10. OTHER SYMPTOMS: "Do you have any other symptoms?" (e.g., fever, chest pain, vomiting, diarrhea, bleeding)       Head congestion.     I just don't feel good. 11. PREGNANCY: "Is there any chance you are pregnant?" "When was your last menstrual period?"       N/A due to age  Protocols used: Dizziness - Lightheadedness-A-AH

## 2022-05-04 NOTE — Telephone Encounter (Signed)
Pt called and VM was left informing pt that letter can been printed via mychart.

## 2022-06-30 ENCOUNTER — Ambulatory Visit: Payer: Managed Care, Other (non HMO) | Admitting: Family Medicine

## 2022-07-14 ENCOUNTER — Ambulatory Visit: Payer: Managed Care, Other (non HMO) | Admitting: Family Medicine

## 2022-09-18 ENCOUNTER — Ambulatory Visit: Payer: Self-pay

## 2022-09-18 ENCOUNTER — Other Ambulatory Visit: Payer: Self-pay | Admitting: Internal Medicine

## 2022-09-18 DIAGNOSIS — I10 Essential (primary) hypertension: Secondary | ICD-10-CM

## 2022-09-18 NOTE — Telephone Encounter (Signed)
Chief Complaint: Pt. Having elevated BP today and constipation x 2 weeks. Has to go out of town today. Asking to be worked in next week. Symptoms: Above Frequency: This week Pertinent Negatives: Patient denies any other symptoms Disposition: [] ED /[] Urgent Care (no appt availability in office) / [] Appointment(In office/virtual)/ []  [place] [facility]/ [] Home Care/ [] Refused Recommended Disposition /[]  Mobile Bus/ [x]  Follow-up with PCP Additional Notes: Please advise pt. Instructed to go to ED for worsening of symptoms. Answer Assessment - Initial Assessment Questions 1. BLOOD PRESSURE: "What is the blood pressure?" "Did you take at least two measurements 5 minutes apart?"     170/100 2. ONSET: "When did you take your blood pressure?"     Today 3. HOW: "How did you take your blood pressure?" (e.g., automatic home BP monitor, visiting nurse)     Home cuff 4. HISTORY: "Do you have a history of high blood pressure?"     Yes 5. MEDICINES: "Are you taking any medicines for blood pressure?" "Have you missed any doses recently?"     Yes 6. OTHER SYMPTOMS: "Do you have any symptoms?" (e.g., blurred vision, chest pain, difficulty breathing, headache, weakness)     Headache 7. PREGNANCY: "Is there any chance you are pregnant?" "When was your last menstrual period?"     No  Answer Assessment - Initial Assessment Questions 1. STOOL PATTERN OR FREQUENCY: "How often do you have a bowel movement (BM)?"  (Normal range: 3 times a day to every 3 days)  "When was your last BM?"       Last BM  2 weeks ago. Had small BM yesterday. 2. STRAINING: "Do you have to strain to have a BM?"      Yes 3. RECTAL PAIN: "Does your rectum hurt when the stool comes out?" If Yes, ask: "Do you have hemorrhoids? How bad is the pain?"  (Scale 1-10; or mild, moderate, severe)     No 4. STOOL COMPOSITION: "Are the stools hard?"      Hard 5. BLOOD ON STOOLS: "Has there been any blood on the toilet tissue  or on the surface of the BM?" If Yes, ask: "When was the last time?"     No 6. CHRONIC CONSTIPATION: "Is this a new problem for you?"  If No, ask: "How long have you had this problem?" (days, weeks, months)      Yes 7. CHANGES IN DIET OR HYDRATION: "Have there been any recent changes in your diet?" "How much fluids are you drinking on a daily basis?"  "How much have you had to drink today?"     No 8. MEDICINES: "Have you been taking any new medicines?" "Are you taking any narcotic pain medicines?" (e.g., Dilaudid, morphine, Percocet, Vicodin)     No 9. LAXATIVES: "Have you been using any stool softeners, laxatives, or enemas?"  If Yes, ask "What, how often, and when was the last time?"     MOM 10. ACTIVITY:  "How much walking do you do every day?"  "Has your activity level decreased in the past week?"        No 11. CAUSE: "What do you think is causing the constipation?"        Unsure 12. OTHER SYMPTOMS: "Do you have any other symptoms?" (e.g., abdomen pain, bloating, fever, vomiting)       No 13. MEDICAL HISTORY: "Do you have a history of hemorrhoids, rectal fissures, or rectal surgery or rectal abscess?"  No 14. PREGNANCY: "Is there any chance you are pregnant?" "When was your last menstrual period?"       No  Protocols used: Blood Pressure - High-A-AH, Constipation-A-AH

## 2022-09-18 NOTE — Telephone Encounter (Signed)
Spoke with patient . Verified name & DOB   Advised patient that the appointment with PCP is available for 09/21/2022 at 8:50 am . Patient voiced that was too early. She needed an appointment round 3:00 pm. Advised patient that the next appointment available would be on 05/20/204 at 2:50 pm patient voiced that she would like that appointment . Advise patient that id S/S worsen to seek medical attention at the ED or UC. Patient voiced understanding of all discussed .

## 2022-09-30 LAB — HM MAMMOGRAPHY: HM Mammogram: NORMAL (ref 0–4)

## 2022-10-02 ENCOUNTER — Ambulatory Visit: Payer: Managed Care, Other (non HMO)

## 2022-10-05 ENCOUNTER — Ambulatory Visit: Payer: Managed Care, Other (non HMO) | Attending: Family Medicine | Admitting: *Deleted

## 2022-10-05 ENCOUNTER — Ambulatory Visit: Payer: Managed Care, Other (non HMO)

## 2022-10-05 DIAGNOSIS — Z111 Encounter for screening for respiratory tuberculosis: Secondary | ICD-10-CM | POA: Diagnosis not present

## 2022-10-05 NOTE — Progress Notes (Signed)
PPD Placement note Marissa Owens, 62 y.o. female is here today for placement of PPD test. Tuberculin skin test applied to left ventral forearm.  Reason for PPD test: Employement Pt taken PPD test before: yes Has the patient ever received the BCG vaccine?: no Has the patient been in recent contact with anyone known or suspected of having active TB disease?: no  O: Alert and oriented in NAD. P:  PPD placed on 10/05/2022.  Patient advised to return for reading within 48-72 hours.

## 2022-10-06 ENCOUNTER — Ambulatory Visit: Payer: Managed Care, Other (non HMO)

## 2022-10-07 ENCOUNTER — Ambulatory Visit: Payer: Managed Care, Other (non HMO)

## 2022-10-08 ENCOUNTER — Ambulatory Visit: Payer: Managed Care, Other (non HMO) | Attending: Family Medicine

## 2022-10-08 DIAGNOSIS — Z111 Encounter for screening for respiratory tuberculosis: Secondary | ICD-10-CM | POA: Diagnosis not present

## 2022-10-08 LAB — TB SKIN TEST
Induration: 0 mm
TB Skin Test: NEGATIVE

## 2022-10-08 NOTE — Progress Notes (Signed)
PPD Reading Note  PPD read and results entered in EpicCare.  Result: 0 mm induration.  Interpretation: neg  If test not read within 48-72 hours of initial placement, patient advised to repeat in other arm 1-3 weeks after this test.  Allergic reaction: no

## 2022-10-12 ENCOUNTER — Ambulatory Visit: Payer: Managed Care, Other (non HMO) | Admitting: Family Medicine

## 2022-10-28 ENCOUNTER — Ambulatory Visit: Payer: Managed Care, Other (non HMO) | Admitting: Physician Assistant

## 2022-11-04 ENCOUNTER — Other Ambulatory Visit: Payer: Self-pay | Admitting: Family Medicine

## 2022-11-04 DIAGNOSIS — I10 Essential (primary) hypertension: Secondary | ICD-10-CM

## 2022-11-09 ENCOUNTER — Other Ambulatory Visit: Payer: Self-pay | Admitting: Family Medicine

## 2022-11-09 DIAGNOSIS — I10 Essential (primary) hypertension: Secondary | ICD-10-CM

## 2022-11-09 NOTE — Telephone Encounter (Signed)
Medication Refill - Medication: amLODipine (NORVASC) 10 MG tablet [[id number]]   Has the patient contacted their pharmacy? Yes.    (Agent: If yes, when and what did the pharmacy advise?) Contact PCP   Preferred Pharmacy (with phone number or street name): [facility] [address]   Has the patient been seen for an appointment in the last year OR does the patient have an upcoming appointment? Yes.   Upcoming appointment July 10   Agent: Please be advised that RX refills may take up to 3 business days. We ask that you follow-up with your pharmacy.   Pt is requesting enough to last until her July 10 appointment.

## 2022-11-10 NOTE — Telephone Encounter (Signed)
Duplicate request- Rx sent yesterday Requested Prescriptions  Pending Prescriptions Disp Refills   amLODipine (NORVASC) 10 MG tablet 30 tablet 0     Cardiovascular: Calcium Channel Blockers 2 Failed - 11/09/2022  1:25 PM      Failed - Valid encounter within last 6 months    Recent Outpatient Visits           8 months ago Essential hypertension   [place] [facility] [place], Cornelius Moras, RPH-CPP   9 months ago Frequent urination   [place] [facility] Marcine Matar, MD   1 year ago Depression, unspecified depression type   [place] [facility] [place], Cari S, [place]   2 years ago Acute midline low back pain without sciatica   [place] [facility] Hoy Register, MD   3 years ago Acute respiratory failure with hypoxia [facility])   [place] [facility] [place], Marzella Schlein, [place]       Future Appointments             In 3 weeks [facility]            Passed - Last BP in normal range    BP Readings from Last 1 Encounters:  02/26/22 122/82         Passed - Last Heart Rate in normal range    Pulse Readings from Last 1 Encounters:  02/26/22 72

## 2022-11-17 ENCOUNTER — Other Ambulatory Visit: Payer: Self-pay

## 2022-11-17 ENCOUNTER — Encounter (HOSPITAL_COMMUNITY): Payer: Self-pay

## 2022-11-17 ENCOUNTER — Emergency Department (HOSPITAL_COMMUNITY)
Admission: EM | Admit: 2022-11-17 | Discharge: 2022-11-18 | Disposition: A | Discharge status: Home / Self Care | Payer: Managed Care, Other (non HMO) | Attending: Emergency Medicine | Admitting: Emergency Medicine

## 2022-11-17 DIAGNOSIS — I700 Atherosclerosis of aorta: Secondary | ICD-10-CM | POA: Diagnosis not present

## 2022-11-17 DIAGNOSIS — J449 Chronic obstructive pulmonary disease, unspecified: Secondary | ICD-10-CM | POA: Insufficient documentation

## 2022-11-17 DIAGNOSIS — I10 Essential (primary) hypertension: Secondary | ICD-10-CM | POA: Insufficient documentation

## 2022-11-17 DIAGNOSIS — M25552 Pain in left hip: Secondary | ICD-10-CM | POA: Insufficient documentation

## 2022-11-17 DIAGNOSIS — R1032 Left lower quadrant pain: Secondary | ICD-10-CM

## 2022-11-17 DIAGNOSIS — Z79899 Other long term (current) drug therapy: Secondary | ICD-10-CM | POA: Diagnosis not present

## 2022-11-17 NOTE — ED Notes (Signed)
Pt is ambulatory to triage room from lobby w/out assistance.

## 2022-11-17 NOTE — ED Triage Notes (Addendum)
Patient reports left hip/pelvic pain that started yesterday. Denies any injury. States pain is worse when walking but does not radiate. Took tylenol at home with no relief.

## 2022-11-18 ENCOUNTER — Emergency Department (HOSPITAL_COMMUNITY): Payer: Managed Care, Other (non HMO)

## 2022-11-18 ENCOUNTER — Encounter (HOSPITAL_COMMUNITY): Payer: Self-pay

## 2022-11-18 LAB — CBC WITH DIFFERENTIAL/PLATELET
Abs Immature Granulocytes: 0.01 10*3/uL (ref 0.00–0.07)
Basophils Absolute: 0.1 10*3/uL (ref 0.0–0.1)
Basophils Relative: 1 %
Eosinophils Absolute: 0.2 10*3/uL (ref 0.0–0.5)
Eosinophils Relative: 2 %
HCT: 46.8 % — ABNORMAL HIGH (ref 36.0–46.0)
Hemoglobin: 15.8 g/dL — ABNORMAL HIGH (ref 12.0–15.0)
Immature Granulocytes: 0 %
Lymphocytes Relative: 52 %
Lymphs Abs: 3.3 10*3/uL (ref 0.7–4.0)
MCH: 31.4 pg (ref 26.0–34.0)
MCHC: 33.8 g/dL (ref 30.0–36.0)
MCV: 93 fL (ref 80.0–100.0)
Monocytes Absolute: 0.6 10*3/uL (ref 0.1–1.0)
Monocytes Relative: 9 %
Neutro Abs: 2.4 10*3/uL (ref 1.7–7.7)
Neutrophils Relative %: 36 %
Platelets: 183 10*3/uL (ref 150–400)
RBC: 5.03 MIL/uL (ref 3.87–5.11)
RDW: 12.3 % (ref 11.5–15.5)
WBC: 6.5 10*3/uL (ref 4.0–10.5)
nRBC: 0 % (ref 0.0–0.2)

## 2022-11-18 LAB — URINALYSIS, ROUTINE W REFLEX MICROSCOPIC
Bilirubin Urine: NEGATIVE
Glucose, UA: NEGATIVE mg/dL
Hgb urine dipstick: NEGATIVE
Ketones, ur: NEGATIVE mg/dL
Nitrite: NEGATIVE
Protein, ur: NEGATIVE mg/dL
Specific Gravity, Urine: 1.01 (ref 1.005–1.030)
pH: 7 (ref 5.0–8.0)

## 2022-11-18 LAB — COMPREHENSIVE METABOLIC PANEL
ALT: 10 U/L (ref 0–44)
AST: 17 U/L (ref 15–41)
Albumin: 4.2 g/dL (ref 3.5–5.0)
Alkaline Phosphatase: 83 U/L (ref 38–126)
Anion gap: 5 (ref 5–15)
BUN: 9 mg/dL (ref 8–23)
CO2: 30 mmol/L (ref 22–32)
Calcium: 8.9 mg/dL (ref 8.9–10.3)
Chloride: 104 mmol/L (ref 98–111)
Creatinine, Ser: 0.71 mg/dL (ref 0.44–1.00)
GFR, Estimated: >60 mL/min (ref >60)
Glucose, Bld: 100 mg/dL — ABNORMAL HIGH (ref 70–99)
Potassium: 3.7 mmol/L (ref 3.5–5.1)
Sodium: 139 mmol/L (ref 135–145)
Total Bilirubin: 0.8 mg/dL (ref 0.3–1.2)
Total Protein: 8.1 g/dL (ref 6.5–8.1)

## 2022-11-18 MED ORDER — KETOROLAC TROMETHAMINE 15 MG/ML IJ SOLN
15.0000 mg | Freq: Once | INTRAMUSCULAR | Status: AC
  Administered 2022-11-18: 15 mg via INTRAVENOUS
  Filled 2022-11-18: qty 1

## 2022-11-18 MED ORDER — DICLOFENAC SODIUM 1 % EX GEL: 4.0000 g | Freq: Four times a day (QID) | CUTANEOUS | 0 refills | Status: DC

## 2022-11-18 MED ORDER — IOHEXOL 300 MG/ML  SOLN
100.0000 mL | Freq: Once | INTRAMUSCULAR | Status: AC | PRN
  Administered 2022-11-18: 100 mL via INTRAVENOUS

## 2022-11-18 MED ORDER — SODIUM CHLORIDE (PF) 0.9 % IJ SOLN
INTRAMUSCULAR | Status: AC
  Filled 2022-11-18: qty 50

## 2022-11-18 NOTE — ED Provider Notes (Signed)
[place] EMERGENCY DEPARTMENT AT [facility] Provider Note   CSN: [id number] Arrival date & time: 11/17/22  2326     History  Chief Complaint  Patient presents with   Hip Pain    Marissa Owens is a 62 y.o. female.  The history is provided by the patient.  Hip Pain  Marissa Owens is a 62 y.o. female who presents to the Emergency Department complaining of hip pain.  She presents emergency department for evaluation of pain in her left hip/groin area that started yesterday.  She describes it as a soreness with pain on standing.  Pain is nonradiating.  No associated fever, chest pain.  She does report some associated lower abdominal pain.  No nausea, vomiting, dysuria.  She does have occasional constipation.  No vaginal discharge or leg pain.  Hx/o COPD, HTN.      Home Medications Prior to Admission medications   Medication Sig Start Date End Date Taking? Authorizing Provider  diclofenac Sodium (VOLTAREN) 1 % GEL Apply 4 g topically 4 (four) times daily. 11/18/22  Yes Tilden Fossa, MD  albuterol (PROVENTIL) (2.5 MG/3ML) 0.083% nebulizer solution Take 3 mLs (2.5 mg total) by nebulization every 6 (six) hours as needed for wheezing or shortness of breath. 09/11/20   Charlott Holler, MD  amLODipine (NORVASC) 10 MG tablet TAKE 1 TABLET BY MOUTH ONCE DAILY, MUST HAVE OFFICE FOR MORE REFILLS 11/09/22   Hoy Register, MD  atorvastatin (LIPITOR) 10 MG tablet Take 1 tablet (10 mg total) by mouth daily. 02/08/22   Marcine Matar, MD  beclomethasone (QVAR REDIHALER) 80 MCG/ACT inhaler Inhale 1 puff into the lungs 2 (two) times daily. 02/06/22   Marcine Matar, MD  divalproex (DEPAKOTE ER) 500 MG 24 hr tablet Take 1,000 mg by mouth at bedtime. 02/13/21   [provider]  Ipratropium-Albuterol (COMBIVENT) 20-100 MCG/ACT AERS respimat Inhale 1 puff into the lungs every 6 (six) hours as needed for wheezing or shortness of breath. 09/30/21   Crain, Whitney L, PA   predniSONE (DELTASONE) 50 MG tablet Take 1 tablet (50 mg total) by mouth daily with breakfast. 09/30/21   Guy Sandifer L, PA  Respiratory Therapy Supplies (ADULT MASK) MISC Needs an mask for her nebulizer machine 07/03/19   Fulp, Cammie, MD  risperiDONE (RISPERDAL) 2 MG tablet Take 1 tablet (2 mg total) by mouth 2 (two) times daily. 01/10/19   Eustace Moore, MD  sertraline (ZOLOFT) 100 MG tablet Take 100 mg by mouth daily. 01/22/22   [provider]  tiotropium (SPIRIVA HANDIHALER) 18 MCG inhalation capsule Place 1 capsule (18 mcg total) into inhaler and inhale daily. 02/06/22   Marcine Matar, MD      Allergies    Patient has no known allergies.    Review of Systems   Review of Systems  All other systems reviewed and are negative.   Physical Exam Updated Vital Signs BP (!) 162/103   Pulse 65   Temp 98.7 F (37.1 C) (Oral)   Resp 18   Ht 5\' 2"  (1.575 m)   Wt 68 kg   SpO2 96%   BMI 27.44 kg/m  Physical Exam Vitals and nursing note reviewed.  Constitutional:      Appearance: She is well-developed.  HENT:     Head: Normocephalic and atraumatic.  Cardiovascular:     Rate and Rhythm: Normal rate and regular rhythm.  Pulmonary:     Effort: Pulmonary effort is normal. No respiratory  distress.  Abdominal:     Palpations: Abdomen is soft.     Tenderness: There is no guarding or rebound.     Comments: Mild LLQ tenderness  Musculoskeletal:        General: No tenderness.     Comments: 2+ Dp pulses bilaterally.  No tenderness to palpation over the hips bilaterally  Skin:    General: Skin is warm and dry.  Neurological:     Mental Status: She is alert and oriented to person, place, and time.  Psychiatric:        Behavior: Behavior normal.     ED Results / Procedures / Treatments   Labs (all labs ordered are listed, but only abnormal results are displayed) Labs Reviewed  URINALYSIS, ROUTINE W REFLEX MICROSCOPIC - Abnormal; Notable for the following components:       Result Value   APPearance CLOUDY (*)    Leukocytes,Ua TRACE (*)    Bacteria, UA RARE (*)    All other components within normal limits  COMPREHENSIVE METABOLIC PANEL - Abnormal; Notable for the following components:   Glucose, Bld 100 (*)    All other components within normal limits  CBC WITH DIFFERENTIAL/PLATELET - Abnormal; Notable for the following components:   Hemoglobin 15.8 (*)    HCT 46.8 (*)    All other components within normal limits    EKG None  Radiology CT ABDOMEN PELVIS W CONTRAST  Result Date: 11/18/2022 CLINICAL DATA:  62 year old female with left lower quadrant abdominal pain. EXAM: CT ABDOMEN AND PELVIS WITH CONTRAST TECHNIQUE: Multidetector CT imaging of the abdomen and pelvis was performed using the standard protocol following bolus administration of intravenous contrast. RADIATION DOSE REDUCTION: This exam was performed according to the departmental dose-optimization program which includes automated exposure control, adjustment of the mA and/or kV according to patient size and/or use of iterative reconstruction technique. CONTRAST:  OMNIPAQUE IOHEXOL 300 MG/ML  SOLN COMPARISON:  Noncontrast CT Abdomen and Pelvis 05/06/2014. CT Abdomen and Pelvis with contrast 08/08/2009. FINDINGS: Lower chest: Mild motion artifact but otherwise negative. Hepatobiliary: CBD is at the upper limits of normal to mildly dilated at 7-8 mm, although was 6 mm diameter in 2015, 7 mm in 2011. Liver and gallbladder otherwise appear normal. Pancreas: Negative.  No main pancreatic ductal dilatation. Spleen: Negative. Adrenals/Urinary Tract: Normal adrenal glands. Nonobstructed kidneys with symmetric renal enhancement and contrast excretion. No nephrolithiasis or renal inflammation identified. Decompressed ureters. Diminutive and unremarkable bladder. Bilateral pelvic phleboliths. Stomach/Bowel: Mild descending colon diverticulosis. Descending and rectosigmoid colon are mostly decompressed. No  obvious inflammation in those segments. Upstream mild retained stool. Normal retrocecal appendix on series 2, image 48. No convincing large bowel inflammation. No dilated small bowel. Stomach also decompressed. Distal duodenum diverticulum located to the left of midline is up to 3.2 cm on series 2, image 22, but no surrounding inflammation. Negative duodenum otherwise. No free air, free fluid, or convincing mesenteric inflammation. Vascular/Lymphatic: Mild to moderate Aortoiliac calcified atherosclerosis. Major arterial structures remain patent. Normal caliber abdominal aorta. Portal venous system is patent. No lymphadenopathy identified. Reproductive: Stable and within normal limits. Other: No pelvis free fluid. Musculoskeletal: Negative. IMPRESSION: 1. Mild descending colon diverticulosis, and duodenal diverticulum, but no convincing bowel inflammation. 2. No acute or inflammatory process identified in the abdomen or pelvis. 3.  Aortic Atherosclerosis (ICD10-I70.0). Electronically Signed   By: Odessa Fleming M.D.   On: 11/18/2022 06:10   DG Hip Unilat W or Wo Pelvis 2-3 Views Left  Result Date:  11/18/2022 CLINICAL DATA:  hip pain EXAM: DG HIP (WITH OR WITHOUT PELVIS) 2-3V LEFT COMPARISON:  None Available. FINDINGS: There is no evidence of hip fracture or dislocation of the left hip. Frontal view of the right hip unremarkable. No acute displaced fracture or diastasis of the bones of the pelvis. There is no evidence of severe arthropathy or other focal bone abnormality. IMPRESSION: Negative for acute traumatic injury. Electronically Signed   By: Tish Frederickson M.D.   On: 11/18/2022 00:41    Procedures Procedures    Medications Ordered in ED Medications  ketorolac (TORADOL) 15 MG/ML injection 15 mg (has no administration in time range)  iohexol (OMNIPAQUE) 300 MG/ML solution 100 mL (100 mLs Intravenous Contrast Given 11/18/22 0535)    ED Course/ Medical Decision Making/ A&P                              Medical Decision Making Amount and/or Complexity of Data Reviewed Labs: ordered. Radiology: ordered.  Risk Prescription drug management.   Patient here for evaluation of atraumatic left hip pain.  She has symmetric pulses on examination with good range of motion in the hip.  She actually has tenderness over her lower abdominal wall in the left lower quadrant.  Plain film is negative for acute fracture or dislocation of the hip-images personally reviewed and interpreted, agree with radiologist interpretation.  UA without evidence of UTI.  CMP was normal renal and hepatic function.  CBC with hemoconcentration-similar when compared to priors.  Given her abdominal tenderness a CT abdomen pelvis was obtained, which is negative for acute abnormality.  Discussed with patient incidental findings of diverticulosis, atherosclerosis.  Discussed with patient unclear source of pain, likely muscle strain.  Discussed continuing her acetaminophen.  Will add on Voltaren gel.  Discussed rest with warm compresses as needed with outpatient follow-up and return precautions.        Final Clinical Impression(s) / ED Diagnoses Final diagnoses:  Left hip pain  LLQ pain  Atherosclerosis of abdominal aorta (HCC)    Rx / DC Orders ED Discharge Orders          Ordered    diclofenac Sodium (VOLTAREN) 1 % GEL  4 times daily        11/18/22 1610              Tilden Fossa, MD 11/18/22 [phone]

## 2022-11-18 NOTE — ED Notes (Signed)
Pt was able to ambulate to restroom with little assistants needed. Urine sample collected.

## 2022-12-02 ENCOUNTER — Encounter: Payer: Self-pay | Admitting: Physician Assistant

## 2022-12-02 ENCOUNTER — Ambulatory Visit: Payer: Managed Care, Other (non HMO) | Attending: Physician Assistant | Admitting: Physician Assistant

## 2022-12-02 VITALS — BP 128/78 | HR 82 | Wt 152.2 lb

## 2022-12-02 DIAGNOSIS — I10 Essential (primary) hypertension: Secondary | ICD-10-CM | POA: Diagnosis not present

## 2022-12-02 DIAGNOSIS — J449 Chronic obstructive pulmonary disease, unspecified: Secondary | ICD-10-CM | POA: Diagnosis not present

## 2022-12-02 DIAGNOSIS — K5900 Constipation, unspecified: Secondary | ICD-10-CM

## 2022-12-02 DIAGNOSIS — M7062 Trochanteric bursitis, left hip: Secondary | ICD-10-CM | POA: Diagnosis not present

## 2022-12-02 DIAGNOSIS — Z09 Encounter for follow-up examination after completed treatment for conditions other than malignant neoplasm: Secondary | ICD-10-CM

## 2022-12-02 DIAGNOSIS — E785 Hyperlipidemia, unspecified: Secondary | ICD-10-CM

## 2022-12-02 MED ORDER — NAPROXEN 500 MG PO TABS: 500.0000 mg | ORAL_TABLET | Freq: Two times a day (BID) | ORAL | 0 refills | Status: DC

## 2022-12-02 MED ORDER — IPRATROPIUM-ALBUTEROL 20-100 MCG/ACT IN AERS: 1.0000 | INHALATION_SPRAY | Freq: Four times a day (QID) | RESPIRATORY_TRACT | 3 refills | Status: DC | PRN

## 2022-12-02 MED ORDER — ATORVASTATIN CALCIUM 10 MG PO TABS: 10.0000 mg | ORAL_TABLET | Freq: Every day | ORAL | 1 refills | Status: DC

## 2022-12-02 MED ORDER — METHOCARBAMOL 500 MG PO TABS: 500.0000 mg | ORAL_TABLET | Freq: Three times a day (TID) | ORAL | 0 refills | Status: DC | PRN

## 2022-12-02 MED ORDER — AMLODIPINE BESYLATE 10 MG PO TABS: ORAL_TABLET | ORAL | 1 refills | Status: DC

## 2022-12-02 NOTE — Progress Notes (Signed)
Patient ID: Marissa Owens, female   DOB: 09/08/1960, 62 y.o.   MRN: [id number]     Marissa Owens, is a 62 y.o. female  [id number]  [id number]  DOB - [birth year]  Chief Complaint  Patient presents with   Hospitalization Follow-up   Medication Refill       Subjective:   Marissa Owens is a 62 y.o. female here today for a follow up visit After being seen at the ED 11/17/2022 for hip pain.  She is still having pain.  She is taking tylenol and having to use voltaren gel frequently.  It has been bothering her about 9 days.  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  It is causing her to favor her R side.  Pain with ambulating and decreased ROM from hip joint.    Also c/o constipation and frequent use of milk of magnesia.  No melena or hematochezia-missed her last colonoscopy.  Does not drink water  From ED note:  Hip Pain   Marissa Owens is a 62 y.o. female who presents to the Emergency Department complaining of hip pain.  She presents emergency department for evaluation of pain in her left hip/groin area that started yesterday.  She describes it as a soreness with pain on standing.  Pain is nonradiating.  No associated fever, chest pain.  She does report some associated lower abdominal pain.  No nausea, vomiting, dysuria.  She does have occasional constipation.  No vaginal discharge or leg pain.  Patient here for evaluation of atraumatic left hip pain.  She has symmetric pulses on examination with good range of motion in the hip.  She actually has tenderness over her lower abdominal wall in the left lower quadrant.  Plain film is negative for acute fracture or dislocation of the hip-images personally reviewed and interpreted, agree with radiologist interpretation.  UA without evidence of UTI.  CMP was normal renal and hepatic function.  CBC with hemoconcentration-similar when compared to priors.  Given  her abdominal tenderness a CT abdomen pelvis was obtained, which is negative for acute abnormality.  Discussed with patient incidental findings of diverticulosis, atherosclerosis.  Discussed with patient unclear source of pain, likely muscle strain.  Discussed continuing her acetaminophen.  Will add on Voltaren gel.  Discussed rest with warm compresses as needed with outpatient follow-up and return precautions. No problems updated.  ALLERGIES: No Known Allergies  PAST MEDICAL HISTORY: Past Medical History:  Diagnosis Date   COPD (chronic obstructive pulmonary disease) (HCC)    Diabetes mellitus without complication (HCC)    Hypertension    Renal disorder    kidney stone    MEDICATIONS AT HOME: Prior to Admission medications   Medication Sig Start Date End Date Taking? Authorizing Provider  albuterol (PROVENTIL) (2.5 MG/3ML) 0.083% nebulizer solution Take 3 mLs (2.5 mg total) by nebulization every 6 (six) hours as needed for wheezing or shortness of breath. 09/11/20  Yes Charlott Holler, MD  beclomethasone (QVAR REDIHALER) 80 MCG/ACT inhaler Inhale 1 puff into the lungs 2 (two) times daily. 02/06/22  Yes Marcine Matar, MD  diclofenac Sodium (VOLTAREN) 1 % GEL Apply 4 g topically 4 (four) times daily. 11/18/22  Yes Tilden Fossa, MD  divalproex (DEPAKOTE ER) 500 MG 24 hr tablet Take 1,000 mg by mouth at bedtime. 02/13/21  Yes [provider]  methocarbamol (ROBAXIN) 500 MG tablet Take 1  tablet (500 mg total) by mouth every 8 (eight) hours as needed for muscle spasms. 12/02/22  Yes Georgian Co M, PA-C  naproxen (NAPROSYN) 500 MG tablet Take 1 tablet (500 mg total) by mouth 2 (two) times daily with a meal. X 7 days then prn pain 12/02/22  Yes Anders Simmonds, PA-C  Respiratory Therapy Supplies (ADULT MASK) MISC Needs an mask for her nebulizer machine 07/03/19  Yes Fulp, Cammie, MD  risperiDONE (RISPERDAL) 2 MG tablet Take 1 tablet (2 mg total) by mouth 2 (two) times daily. 01/10/19   Yes Eustace Moore, MD  sertraline (ZOLOFT) 100 MG tablet Take 100 mg by mouth daily. 01/22/22  Yes [provider]  amLODipine (NORVASC) 10 MG tablet TAKE 1 TABLET BY MOUTH ONCE DAILY 12/02/22   Georgian Co M, PA-C  atorvastatin (LIPITOR) 10 MG tablet Take 1 tablet (10 mg total) by mouth daily. 12/02/22   Anders Simmonds, PA-C  Ipratropium-Albuterol (COMBIVENT) 20-100 MCG/ACT AERS respimat Inhale 1 puff into the lungs every 6 (six) hours as needed for wheezing or shortness of breath. 12/02/22   Anders Simmonds, PA-C  tiotropium (SPIRIVA HANDIHALER) 18 MCG inhalation capsule Place 1 capsule (18 mcg total) into inhaler and inhale daily. 02/06/22   Marcine Matar, MD    ROS: Neg HEENT Neg resp Neg cardiac Neg GU Neg psych Neg neuro  Objective:   Vitals:   12/02/22 1358 12/02/22 1420  BP: (!) 134/91 128/78  Pulse: 82   SpO2: 92%   Weight: 152 lb 3.2 oz (69 kg)    Exam General appearance : Awake, alert, not in any distress. Speech Clear. Not toxic looking HEENT: Atraumatic and Normocephalic Neck: Supple, no JVD. No cervical lymphadenopathy.  Chest: Good air entry bilaterally, CTAB.  No rales/rhonchi/wheezing CVS: S1 S2 regular, no murmurs.  L vs R hip examined.  R hip with normal S&ROM.  L hip with ~50% normal external and internal rotation and there is TTP over trochanteric bursa without overlying redness or swelling.   Extremities: B/L Lower Ext shows no edema, both legs are warm to touch Neurology: Awake alert, and oriented X 3, CN II-XII intact, Non focal Skin: No Rash  Data Review Lab Results  Component Value Date   HGBA1C 6.0 (H) 02/06/2022   HGBA1C 5.4 03/19/2021   HGBA1C 6.0 (H) 07/31/2019    Assessment & Plan   1. Essential hypertension Controlled-CMP and CBC essentially normal.  Increase water intake - amLODipine (NORVASC) 10 MG tablet; TAKE 1 TABLET BY MOUTH ONCE DAILY  Dispense: 90 tablet; Refill: 1  2. Chronic obstructive pulmonary  disease, unspecified COPD type (HCC) stable - Ipratropium-Albuterol (COMBIVENT) 20-100 MCG/ACT AERS respimat; Inhale 1 puff into the lungs every 6 (six) hours as needed for wheezing or shortness of breath.  Dispense: 4 g; Refill: 3  3. Encounter for examination following treatment at hospital  4. Trochanteric bursitis of left hip - naproxen (NAPROSYN) 500 MG tablet; Take 1 tablet (500 mg total) by mouth 2 (two) times daily with a meal. X 7 days then prn pain  Dispense: 60 tablet; Refill: 0 - methocarbamol (ROBAXIN) 500 MG tablet; Take 1 tablet (500 mg total) by mouth every 8 (eight) hours as needed for muscle spasms.  Dispense: 90 tablet; Refill: 0 - Ambulatory referral to Orthopedic Surgery  5. Dyslipidemia - atorvastatin (LIPITOR) 10 MG tablet; Take 1 tablet (10 mg total) by mouth daily.  Dispense: 90 tablet; Refill: 1  6. Constipation-increase water intake, schedule colonoscopy/gi  referral placed   Return in about 4 months (around 04/04/2023) for PCP([facility]) for chronic conditions.  The patient was given clear instructions to go to ER or return to medical center if symptoms don't improve, worsen or new problems develop. The patient verbalized understanding. The patient was told to call to get lab results if they haven't heard anything in the next week.      Georgian Co, PA-C [facility] [place], [place] [phone]   12/02/2022, 2:23 PM

## 2022-12-02 NOTE — Patient Instructions (Signed)
Hip Bursitis  Hip bursitis is swelling of one or more fluid-filled sacs (bursae) in your hip joint. If the bursa becomes irritated, it can fill with extra fluid and become swollen. This condition can cause pain, and your symptoms may come and go over time. What are the causes? Repeated use of your hip muscles. Injury to the hip. Weak butt muscles. Bone spurs. Infection. In some cases, the cause may not be known. What increases the risk? Having a past hip injury or hip surgery. Having a condition, such as arthritis, gout, diabetes, or thyroid disease. Having spine problems. Having one leg that is shorter than the other. Running a lot or doing long-distance running. Playing sports where there is a risk of injury or falling, such as football, martial arts, or skiing. What are the signs or symptoms? Symptoms may come and go, and they often include: Pain in the hip or groin area. Pain may get worse when you move your hip. Tenderness and swelling of the hip. In rare cases, the bursa may become infected. If this happens, you may: Get a fever. Have warmth and redness in the hip area. How is this treated? This condition is treated by: Resting your hip. Icing your hip. Wrapping the hip area with an elastic bandage (compression wrap). Keeping the hip raised. Other treatments may include: Using crutches, a cane, or a walker. Medicines. Draining fluid out of the bursa. Surgery to take out a bursa. This is rare. Long-term treatment may include: Doing exercises to help your strength and flexibility. Lifestyle changes like losing weight to lessen the strain on your hip. Follow these instructions at home: Managing pain, stiffness, and swelling     If told, put ice on the painful area. To do this: Put ice in a plastic bag. Place a towel between your skin and the bag. Leave the ice on for 20 minutes, 2-3 times a day. Take off the ice if your skin turns bright red. This is very important.  If you cannot feel pain, heat, or cold, you have a greater risk of damage to the area. Raise your hip by putting a pillow under your hips while you lie down. Stop if you feel pain. If told, put heat on the affected area. Do this as often as told by your doctor. Use the heat source that your doctor recommends, such as a moist heat pack or a heating pad. Place a towel between your skin and the heat source. Leave the heat on for 20-30 minutes. Take off the heat if your skin turns bright red. This is very important. If you cannot feel pain, heat, or cold, you have a greater risk of getting burned. Activity Do not use your hip to support your body weight until your doctor says that you can. Use crutches, a cane, or a walker as told by your doctor. If the affected leg is one that you use to drive, ask your doctor if it is safe to drive. Rest and protect your hip as much as you can until you feel better. Return to your normal activities when your doctor says that it is safe. Do exercises as told by your doctor. General instructions Take over-the-counter and prescription medicines only as told by your doctor. Gently rub and stretch your injured area as often as is comfortable. Wear elastic bandages only as told by your doctor. If one of your legs is shorter than the other, get fitted for a shoe insert or orthotic. Keep a healthy   weight. Follow instructions from your doctor. Keep all follow-up visits. How is this prevented? Exercise regularly or as told by your doctor. Wear the right shoes for the sport you play and for daily activities. Warm up and stretch before being active. Cool down and stretch after being active. Take breaks often from repeated activity. Avoid activities that bother your hip or cause pain. Avoid sitting down for a long time. Where to find more information American Academy [employer]: orthoinfo.aaos.org Contact a doctor if: You have a fever. You have new  symptoms. You have trouble walking or doing everyday activities. You have pain that gets worse or does not get better with medicine. The skin around your hip is red. You get a feeling of warmth in your hip area. Get help right away if: You cannot move your hip. You have very bad pain. You cannot control the muscles in your feet. Summary Hip bursitis is swelling of one or more fluid-filled sacs (bursae) in your hip joint. Symptoms often come and go over time. This condition is often treated by resting and icing the hip. It also may help to keep the area raised and wrapped in an elastic bandage. Other treatments may be needed. This information is not intended to replace advice given to you by your health care provider. Make sure you discuss any questions you have with your health care provider. Document Revised: 05/06/2021 Document Reviewed: 05/06/2021 Elsevier Patient Education  2024 Elsevier Inc.  

## 2022-12-16 ENCOUNTER — Ambulatory Visit: Payer: Managed Care, Other (non HMO) | Admitting: Physician Assistant

## 2023-03-18 ENCOUNTER — Ambulatory Visit: Payer: Managed Care, Other (non HMO) | Admitting: Physician Assistant

## 2023-04-07 ENCOUNTER — Encounter: Payer: Self-pay | Admitting: Physician Assistant

## 2023-04-07 ENCOUNTER — Ambulatory Visit: Payer: Managed Care, Other (non HMO) | Attending: Physician Assistant | Admitting: Physician Assistant

## 2023-04-07 VITALS — BP 133/89 | HR 72 | Wt 152.8 lb

## 2023-04-07 DIAGNOSIS — G4440 Drug-induced headache, not elsewhere classified, not intractable: Secondary | ICD-10-CM

## 2023-04-07 DIAGNOSIS — J449 Chronic obstructive pulmonary disease, unspecified: Secondary | ICD-10-CM | POA: Diagnosis not present

## 2023-04-07 DIAGNOSIS — K5900 Constipation, unspecified: Secondary | ICD-10-CM | POA: Diagnosis not present

## 2023-04-07 DIAGNOSIS — E860 Dehydration: Secondary | ICD-10-CM

## 2023-04-07 MED ORDER — POLYETHYLENE GLYCOL 3350 17 GM/SCOOP PO POWD: 17.0000 g | Freq: Two times a day (BID) | ORAL | 1 refills | Status: DC | PRN

## 2023-04-07 MED ORDER — NAPROXEN 500 MG PO TABS: 500.0000 mg | ORAL_TABLET | Freq: Two times a day (BID) | ORAL | 0 refills | Status: DC

## 2023-04-07 MED ORDER — METHOCARBAMOL 500 MG PO TABS: 500.0000 mg | ORAL_TABLET | Freq: Three times a day (TID) | ORAL | 0 refills | Status: DC | PRN

## 2023-04-07 MED ORDER — FLUTICASONE-SALMETEROL 250-50 MCG/ACT IN AEPB: 1.0000 | INHALATION_SPRAY | Freq: Two times a day (BID) | RESPIRATORY_TRACT | 4 refills | Status: DC

## 2023-04-07 MED ORDER — ALBUTEROL SULFATE HFA 108 (90 BASE) MCG/ACT IN AERS: 2.0000 | INHALATION_SPRAY | Freq: Four times a day (QID) | RESPIRATORY_TRACT | 2 refills | Status: AC | PRN

## 2023-04-07 NOTE — Patient Instructions (Signed)
Please drink 64 to 80 ounces water daily.     Analgesic Rebound Headache An analgesic rebound headache is a secondary disorder that is caused by the overuse of pain medicine (analgesic) to treat the original (primary) headache. It is sometimes called a medication overuse headache or a drug-induced headache. Any type of primary headache can return as a rebound headache if a person regularly takes pain medicine. The types of primary headaches that are commonly related to rebound headaches include: Migraines. Tension headaches. These are caused by tense muscles in the head and neck area. Cluster headaches. These happen on one side of the head and around the eye. If rebound headaches continue, they can become long-term, daily headaches. What are the causes? Rebound headaches may be caused by frequent use of: Over-the-counter medicines, such as aspirin, ibuprofen, and acetaminophen. Sinus-relief medicines. Medicines that contain caffeine. Narcotic pain medicines, such as codeine and oxycodone. Some prescription migraine medicines. What are the signs or symptoms? The symptoms of a rebound headache are the same as the symptoms of the primary headache. Symptoms of specific types of headaches include: Migraine headache Pulsing or throbbing pain on one or both sides of the head. Severe pain that makes it hard to do daily activities. Pain that gets worse with physical activity. Nausea, vomiting, or both. Pain that may get worse around bright lights, loud noises, or smells. Vision changes. Numbness in one or both arms. Tension headache Pressure around the head. Dull, aching head pain. Pain felt over the front and sides of the head. Tenderness in the muscles of the head, neck, and shoulders. Cluster headache Severe pain that begins in or around one eye or temple. Droopy or swollen eyelid, or redness and tearing in the eye on the same side as the pain. One-sided head pain. Nausea. Runny  nose. Sweaty, pale facial skin. Restlessness. How is this diagnosed? Rebound headaches are diagnosed by reviewing: Your medical history, including a description of your primary headaches. Your pain medicines for your primary headaches and how often you take them. How is this treated? Rebound headaches may be treated or managed by: Stopping frequent use of pain medicine. This may make your headaches worse at first, but the pain should then become less manageable and less frequent and severe. Seeing a headache specialist. They may be able to help you manage your headaches and help make sure there is not another cause of the headaches. Using methods of stress relief, such as acupuncture, counseling, biofeedback, and massage. Follow these instructions at home: Medicines  Take over-the-counter and prescription medicines only as told by your health care provider. Stop the repeated use of pain medicine as told by your provider. Stopping can be hard. Carefully follow instructions from your provider. Lifestyle Do not drink alcohol. Do not use any products that contain nicotine or tobacco. These products include cigarettes, chewing tobacco, and vaping devices, such as e-cigarettes. If you need help quitting, ask your provider. Get 7-9 hours of sleep each night, or the amount recommended by your provider. Find ways to manage stress, such as acupuncture, counseling, biofeedback, and massage. Exercise regularly. Exercise for at least 30 minutes, 5 times each week. General instructions Avoid things that may bring on (trigger) your primary headaches, such as certain foods. Contact a health care provider if: Medicine does not help your migraine. Your pain keeps coming back even with medicine. Get help right away if: You have new headache pain. You have headache pain that is different than what you have felt  in the past. You have numbness or tingling in your arms or legs. You have changes in your  speech or vision. This information is not intended to replace advice given to you by your health care provider. Make sure you discuss any questions you have with your health care provider. Document Revised: 01/05/2022 Document Reviewed: 01/05/2022 Elsevier Patient Education  2024 [employer].

## 2023-04-07 NOTE — Progress Notes (Signed)
Patient ID: Marissa Owens, female   DOB: 1960-10-19, 62 y.o.   MRN: [id number]   Marissa Owens, is a 62 y.o. female  [id number]  [id number]  DOB - [birth year]  Chief Complaint  Patient presents with   COPD   Headache   Constipation       Subjective:   Marissa Owens is a 62 y.o. female here today for several issues.  Daily HA for at least 2 months.  Usually in Back of head and come to front of head.  No vision changes.  Taking goody's powders 6 to 7 times daily which does make the HA go away.    She is not able to afford the last daily inhaler she got for her COPD so she has only been using albuterol sometimes.  Needs a daily inhaler.    C/o constipation and takes MOM once weekly to have a BM.  No melena or hematochezia.  She has had GI referrals but has never gone to an appt.  No abdominal pain or weight loss.    No problems updated.  ALLERGIES: No Known Allergies  PAST MEDICAL HISTORY: Past Medical History:  Diagnosis Date   COPD (chronic obstructive pulmonary disease) (HCC)    Diabetes mellitus without complication (HCC)    Hypertension    Renal disorder    kidney stone    MEDICATIONS AT HOME: Prior to Admission medications   Medication Sig Start Date End Date Taking? Authorizing Provider  albuterol (PROVENTIL) (2.5 MG/3ML) 0.083% nebulizer solution Take 3 mLs (2.5 mg total) by nebulization every 6 (six) hours as needed for wheezing or shortness of breath. 09/11/20  Yes Charlott Holler, MD  albuterol (VENTOLIN HFA) 108 (90 Base) MCG/ACT inhaler Inhale 2 puffs into the lungs every 6 (six) hours as needed for wheezing or shortness of breath. 04/07/23  Yes Georgian Co M, PA-C  amLODipine (NORVASC) 10 MG tablet TAKE 1 TABLET BY MOUTH ONCE DAILY 12/02/22  Yes Georgian Co M, PA-C  atorvastatin (LIPITOR) 10 MG tablet Take 1 tablet (10 mg total) by mouth daily. 12/02/22  Yes Georgian Co M, PA-C  diclofenac Sodium (VOLTAREN) 1 % GEL Apply 4 g topically 4  (four) times daily. 11/18/22  Yes Tilden Fossa, MD  divalproex (DEPAKOTE ER) 500 MG 24 hr tablet Take 1,000 mg by mouth at bedtime. 02/13/21  Yes [provider]  fluticasone-salmeterol (ADVAIR) 250-50 MCG/ACT AEPB Inhale 1 puff into the lungs in the morning and at bedtime. 04/07/23  Yes Georgian Co M, PA-C  polyethylene glycol powder (GLYCOLAX/MIRALAX) 17 GM/SCOOP powder Take 17 g by mouth 2 (two) times daily as needed. 04/07/23  Yes Anders Simmonds, PA-C  Respiratory Therapy Supplies (ADULT MASK) MISC Needs an mask for her nebulizer machine 07/03/19  Yes Fulp, Cammie, MD  risperiDONE (RISPERDAL) 2 MG tablet Take 1 tablet (2 mg total) by mouth 2 (two) times daily. 01/10/19  Yes Eustace Moore, MD  sertraline (ZOLOFT) 100 MG tablet Take 100 mg by mouth daily. 01/22/22  Yes [provider]  methocarbamol (ROBAXIN) 500 MG tablet Take 1 tablet (500 mg total) by mouth every 8 (eight) hours as needed for muscle spasms. 04/07/23   Anders Simmonds, PA-C  naproxen (NAPROSYN) 500 MG tablet Take 1 tablet (500 mg total) by mouth 2 (two) times daily with a meal. X 7 days then prn pain 04/07/23   Mylinh Cragg M, PA-C    ROS: Neg HEENT Neg cardiac Neg GU Neg MS Neg psych  Objective:   Vitals:   04/07/23 1337  BP: 133/89  Pulse: 72  SpO2: 96%  Weight: 152 lb 12.8 oz (69.3 kg)   Exam General appearance : Awake, alert, not in any distress. Speech Clear. Not toxic looking HEENT: Atraumatic and Normocephalic, pupils equally reactive to light and accomodation, EOM intact.  TM clear.  Some nasal congestion.   Neck: Supple, no JVD. No cervical lymphadenopathy. +spasm in back of neck Chest: fair air entry bilaterally,   No rales/rhonchi. There is mild wheezing CVS: S1 S2 regular, no murmurs.  Extremities: B/L Lower Ext shows no edema, both legs are warm to touch Neurology: Awake alert, and oriented X 3, CN II-XII intact, Non focal Skin: No Rash  Data Review Lab Results   Component Value Date   HGBA1C 6.0 (H) 02/06/2022   HGBA1C 5.4 03/19/2021   HGBA1C 6.0 (H) 07/31/2019    Assessment & Plan   1. Chronic obstructive pulmonary disease, unspecified COPD type (HCC) - fluticasone-salmeterol (ADVAIR) 250-50 MCG/ACT AEPB; Inhale 1 puff into the lungs in the morning and at bedtime.  Dispense: 60 each; Refill: 4 - albuterol (VENTOLIN HFA) 108 (90 Base) MCG/ACT inhaler; Inhale 2 puffs into the lungs every 6 (six) hours as needed for wheezing or shortness of breath.  Dispense: 26.8 g; Refill: 2  2. Medication overuse headache Stop goody's powders.  Too much caffeine and aspirin causing rebound HA - methocarbamol (ROBAXIN) 500 MG tablet; Take 1 tablet (500 mg total) by mouth every 8 (eight) hours as needed for muscle spasms.  Dispense: 90 tablet; Refill: 0 - naproxen (NAPROSYN) 500 MG tablet; Take 1 tablet (500 mg total) by mouth 2 (two) times daily with a meal. X 7 days then prn pain  Dispense: 60 tablet; Refill: 0  3. Dehydration Drink 64 to 80 ounces water daily  4. Constipation, unspecified constipation type Increase water intake-we keep referring her for GI but she has not kept appts with them  she is overdue for colonoscopy - Ambulatory referral to Gastroenterology - polyethylene glycol powder (GLYCOLAX/MIRALAX) 17 GM/SCOOP powder; Take 17 g by mouth 2 (two) times daily as needed.  Dispense: 3350 g; Refill: 1    Return in about 4 months (around 08/05/2023) for PCP for chronic conditions-[facility].  The patient was given clear instructions to go to ER or return to medical center if symptoms don't improve, worsen or new problems develop. The patient verbalized understanding. The patient was told to call to get lab results if they haven't heard anything in the next week.      Georgian Co, PA-C [facility] [place], [place] [phone]   04/07/2023, 2:18 PM

## 2023-04-26 ENCOUNTER — Ambulatory Visit: Payer: Managed Care, Other (non HMO) | Admitting: Internal Medicine

## 2023-04-26 ENCOUNTER — Other Ambulatory Visit: Payer: Self-pay

## 2023-04-26 ENCOUNTER — Encounter: Payer: Self-pay | Admitting: Internal Medicine

## 2023-04-26 VITALS — BP 132/76 | HR 72 | Temp 98.2°F | Ht 61.0 in | Wt 155.0 lb

## 2023-04-26 DIAGNOSIS — F17210 Nicotine dependence, cigarettes, uncomplicated: Secondary | ICD-10-CM | POA: Diagnosis not present

## 2023-04-26 DIAGNOSIS — J4489 Other specified chronic obstructive pulmonary disease: Secondary | ICD-10-CM

## 2023-04-26 DIAGNOSIS — J439 Emphysema, unspecified: Secondary | ICD-10-CM | POA: Diagnosis not present

## 2023-04-26 MED ORDER — BREZTRI AEROSPHERE 160-9-4.8 MCG/ACT IN AERO: 2.0000 | INHALATION_SPRAY | Freq: Two times a day (BID) | RESPIRATORY_TRACT | Status: DC

## 2023-04-26 MED ORDER — ALBUTEROL SULFATE (2.5 MG/3ML) 0.083% IN NEBU
2.5000 mg | INHALATION_SOLUTION | Freq: Four times a day (QID) | RESPIRATORY_TRACT | 12 refills | Status: DC | PRN
  Filled 2023-04-26: qty 75, 7d supply, fill #0
  Filled 2024-02-25 (×2): qty 75, 7d supply, fill #1
  Filled 2024-02-28: qty 75, 7d supply, fill #0

## 2023-04-26 MED ORDER — BUPROPION HCL ER (SR) 150 MG PO TB12
150.0000 mg | ORAL_TABLET | Freq: Two times a day (BID) | ORAL | 5 refills | Status: DC
  Filled 2023-04-26: qty 60, 30d supply, fill #0

## 2023-04-26 NOTE — Patient Instructions (Addendum)
It was a pleasure to see you today!  Please schedule follow up scheduled with myself in 4 months.  If my schedule is not open yet, we will contact you with a reminder closer to that time. Please call [phone] if you haven't heard from [place] a month before, and always call us sooner if issues or concerns arise. You can also send [place] a message through MyChart, but but aware that this is not to be used for urgent issues and it may take up to 5-7 days to receive a reply. Please be aware that you will likely be able to view your results before I have a chance to respond to them. Please give [place] 5 business days to respond to any non-urgent results.    Before your next visit I would like you to have: Full set of PFTs - breathing testing.  Please answer the phone when lung cancer screening calls - to schedule your CT scan. This is a covered preventive service.  I will give you patient assistance forms for Breztri to help make this medication affordable.  I am starting you on breztri 2 puffs twice daily, gargle after use.  Continue albuterol inhaler as needed up to 4 times/day. Please stop taking it 3-4 times every hour.  Call 1800 QUIT NOW for patches to help quit smoking.   Bupropion -- Bupropion (brand names: Zyban, Wellbutrin) is an antidepressant that can be used to help you stop smoking.  Start taking it once per day for three days, then increase to twice daily starting four weeks before the quit date.  You should typically continue for 7 to 12 weeks after you quit smoking.  Please do not stop taking this medication abruptly.  When you are ready to stop we will have you go to once daily for two weeks and then you can do once every other day for a week before you stop.   Bupropion is generally well-tolerated, but it may cause dry mouth and difficulty sleeping. The drug should not be used by people who have a seizure disorder or bipolar (manic-depressive) disorder, and it is not recommended for those who  have head trauma, anorexia nervosa, or bulimia, or for those who drink alcohol excessively.

## 2023-04-26 NOTE — Progress Notes (Signed)
Marissa Owens    [id number]    1961/01/13  Primary Care Physician:Newlin, Odette Horns, MD Date of Appointment: 04/26/2023 Established Patient Visit  Chief complaint:   Chief Complaint  Patient presents with   Follow-up    Breathing is has worsened since last OV- increased SOB, non prod cough and wheezing.      HPI: Marissa Owens is a 62 y.o. woman with COPD and ongoing tobacco use disorder.  Interval Updates: Here for worsening shortness of breath and coughing. Hasn't been seen in 2.5 years. Was seen by PCP two weeks ago. Was not on inhaler therapy at that time due to cost. Has been represcribed advair but has not picked it up. Has been taking albuterol only up to 3-5 times/hour.   Sill smoking 1/2 ppd. Very motivated to quit. Would like to try patches. Open to anything that can help her.   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  Asking if I can help her reduce her hours to 3 days/week.   I have reviewed the patient's family social and past medical history and updated as appropriate.   Past Medical History:  Diagnosis Date   COPD (chronic obstructive pulmonary disease) (HCC)    Diabetes mellitus without complication (HCC)    Hypertension    Renal disorder    kidney stone    Past Surgical History:  Procedure Laterality Date   TUBAL LIGATION      Family History  Problem Relation Age of Onset   Cancer Mother    Hypertension Mother    Lung cancer Brother     Social History   Occupational History   Not on file  Tobacco Use   Smoking status: Every Day    Current packs/day: 1.00    Types: Cigarettes   Smokeless tobacco: Never   Tobacco comments:    30  Vaping Use   Vaping status: Never Used  Substance and Sexual Activity   Alcohol use: No   Drug use: No   Sexual activity: Not Currently     Physical Exam: Blood pressure 132/76, pulse 72, temperature 98.2 F (36.8 C), temperature source Oral, height 5\' 1"   (1.549 m), weight 155 lb (70.3 kg), SpO2 94%.  Gen:      No distress, fatigued ENT: poor dentition Lungs:    diminished, no wheeze CV:         RRR no mrg   Data Reviewed: Imaging: I have personally reviewed the chest xray obtained today which shows chronic interstitial changes from COPD, no focal consolidation.   PFTs: None on file  Labs:  Immunization status: Immunization History  Administered Date(s) Administered   Influenza,inj,Quad PF,6+ Mos 04/14/2018, 04/24/2019, 04/24/2020, 02/06/2022   PFIZER(Purple Top)SARS-COV-2 Vaccination 09/16/2019, 10/05/2019   PPD Test 10/05/2022   Tdap 02/06/2022    Assessment:  COPD, chronic bronchitis, progressing  Tobacco use disorder Encounter for smoking cessation counseling Need for LDCT for lung cancer screening   Plan/Recommendations: Full set of PFTs - breathing testing.  Please answer the phone when lung cancer screening calls - to schedule your CT scan. This is a covered preventive service.  I will give you patient assistance forms for Breztri to help make this medication affordable.  I am starting you on breztri 2 puffs twice daily, gargle after use.  Continue albuterol inhaler as needed up to 4 times/day. Please stop taking it 3-4 times every hour.  Call  1800 QUIT NOW for patches to help quit smoking.   Letter provided today to recommend reduced schedule to 3 days/week.   Smoking Cessation Counseling:  1. The patient is an everyday smoker and symptomatic due to the following condition copd 2. The patient is currently contemplative in quitting smoking. 3. I advised patient to quit smoking. 4. We identified patient specific barriers to change.  5. I personally spent 3 minutes counseling the patient regarding tobacco use disorder. 6. We discussed management of stress and anxiety to help with smoking cessation, when applicable. 7. We discussed nicotine replacement therapy, Wellbutrin, Chantix as possible options. 8. I  advised setting a quit date. 9. Follow?up arranged with our office to continue ongoing discussions. 10.Resources given to patient including quit hotline.     Return to Care: Return in about 4 months (around 08/25/2023).   Durel Salts, MD Pulmonary and Critical Care Medicine [facility] Office:[phone]

## 2023-04-27 ENCOUNTER — Other Ambulatory Visit: Payer: Self-pay

## 2023-04-27 ENCOUNTER — Other Ambulatory Visit: Payer: Self-pay | Admitting: Physician Assistant

## 2023-04-27 DIAGNOSIS — G4440 Drug-induced headache, not elsewhere classified, not intractable: Secondary | ICD-10-CM

## 2023-04-27 DIAGNOSIS — F17210 Nicotine dependence, cigarettes, uncomplicated: Secondary | ICD-10-CM

## 2023-04-27 DIAGNOSIS — Z122 Encounter for screening for malignant neoplasm of respiratory organs: Secondary | ICD-10-CM

## 2023-04-27 DIAGNOSIS — Z87891 Personal history of nicotine dependence: Secondary | ICD-10-CM

## 2023-04-27 MED ORDER — METHOCARBAMOL 500 MG PO TABS
500.0000 mg | ORAL_TABLET | Freq: Three times a day (TID) | ORAL | 0 refills | Status: DC | PRN
  Filled 2023-04-27: qty 90, 30d supply, fill #0

## 2023-04-28 ENCOUNTER — Other Ambulatory Visit: Payer: Self-pay

## 2023-04-28 ENCOUNTER — Other Ambulatory Visit (HOSPITAL_COMMUNITY): Payer: Self-pay

## 2023-05-04 ENCOUNTER — Ambulatory Visit: Payer: Managed Care, Other (non HMO) | Admitting: Acute Care

## 2023-05-04 DIAGNOSIS — F17210 Nicotine dependence, cigarettes, uncomplicated: Secondary | ICD-10-CM

## 2023-05-04 NOTE — Progress Notes (Signed)
 Virtual Visit via Video Note  I connected with Marissa Owens on 05/04/23 at  1:00 PM EST by a video enabled telemedicine application and verified that I am speaking with the correct person using two identifiers.  Location: Patient: in home  Provider: [address], [place], [place], [address]   Shared Decision Making Visit Lung Cancer Screening Program [phone])   Eligibility: Age 62 y.o. Pack Years Smoking History Calculation 47 (# packs/per year x # years smoked) Recent History of coughing up blood  no Unexplained weight loss? no ( >Than 15 pounds within the last 6 months ) Prior History Lung / other cancer no (Diagnosis within the last 5 years already requiring surveillance chest CT Scans). Smoking Status Current Smoker Former Smokers: Years since quit: NA  Quit Date: NA  Visit Components: Discussion included one or more decision making aids. yes Discussion included risk/benefits of screening. yes Discussion included potential follow up diagnostic testing for abnormal scans. yes Discussion included meaning and risk of over diagnosis. yes Discussion included meaning and risk of False Positives. yes Discussion included meaning of total radiation exposure. yes  Counseling Included: Importance of adherence to annual lung cancer LDCT screening. yes Impact of comorbidities on ability to participate in the program. yes Ability and willingness to under diagnostic treatment. yes  Smoking Cessation Counseling: Current Smokers:  Discussed importance of smoking cessation. yes Information about tobacco cessation classes and interventions provided to patient. yes Patient provided with "ticket" for LDCT Scan. yes Symptomatic Patient. no  Counseling NA Diagnosis Code: Tobacco Use Z72.0 Asymptomatic Patient yes  Counseling (Intermediate counseling: > three minutes counseling) Q5956 Former Smokers:  Discussed the importance of maintaining cigarette abstinence. yes Diagnosis  Code: Personal History of Nicotine Dependence. L87.564 Information about tobacco cessation classes and interventions provided to patient. Yes Patient provided with "ticket" for LDCT Scan. yes Written Order for Lung Cancer Screening with LDCT placed in Epic. Yes (CT Chest Lung Cancer Screening Low Dose W/O CM) [id number] Z12.2-Screening of respiratory organs Z87.891-Personal history of nicotine dependence   Karl Bales, RN 05/04/23

## 2023-05-04 NOTE — Patient Instructions (Signed)

## 2023-05-05 ENCOUNTER — Other Ambulatory Visit: Payer: Self-pay | Admitting: Physician Assistant

## 2023-05-05 DIAGNOSIS — G4440 Drug-induced headache, not elsewhere classified, not intractable: Secondary | ICD-10-CM

## 2023-05-11 ENCOUNTER — Ambulatory Visit
Admission: RE | Admit: 2023-05-11 | Discharge: 2023-05-11 | Disposition: A | Discharge status: Home / Self Care | Payer: Managed Care, Other (non HMO) | Source: Ambulatory Visit | Attending: Family Medicine | Admitting: Family Medicine

## 2023-05-11 DIAGNOSIS — Z87891 Personal history of nicotine dependence: Secondary | ICD-10-CM

## 2023-05-11 DIAGNOSIS — F17210 Nicotine dependence, cigarettes, uncomplicated: Secondary | ICD-10-CM

## 2023-05-11 DIAGNOSIS — Z122 Encounter for screening for malignant neoplasm of respiratory organs: Secondary | ICD-10-CM

## 2023-05-24 ENCOUNTER — Other Ambulatory Visit: Payer: Self-pay

## 2023-05-24 DIAGNOSIS — Z122 Encounter for screening for malignant neoplasm of respiratory organs: Secondary | ICD-10-CM

## 2023-05-24 DIAGNOSIS — F17210 Nicotine dependence, cigarettes, uncomplicated: Secondary | ICD-10-CM

## 2023-05-24 DIAGNOSIS — Z87891 Personal history of nicotine dependence: Secondary | ICD-10-CM

## 2023-05-25 ENCOUNTER — Telehealth: Payer: Self-pay | Admitting: *Deleted

## 2023-05-25 DIAGNOSIS — Z1211 Encounter for screening for malignant neoplasm of colon: Secondary | ICD-10-CM

## 2023-05-25 NOTE — Telephone Encounter (Addendum)
 Dr. Millard team follow up required: Patient need assistance with referral for colonoscopy and also has active order for GI referral for constipation, please add to the order.   Please contact patient at [phone]  by 05/28/23, regarding updated referral and also has a question related to cholesterol medication. May also contact Monta Fell BSN, RN,CCM, Health Equity Program Manager (HEPM) at [phone]  and/ or review HEPM outreach notes, if additional information needed.  Chart reviewed and Administrator, Civil Service (DPR) verification completed. No DPR on file in [facility].    Chart review highlights and potential barriers to colorectal cancer screening (CRC) screening: Per patient's health maintenance, patient has never had colonoscopy.  Patient had GI referral for constipation placed on 04/07/23 and status is pending.  Patient also had GI referral for constipation that was closed on 01/11/23 because patient did not return call to [facility] to schedule.  Patient had GI referral for colonoscopy closed on 04/02/22 because patient did not return call to [facility] to schedule.   Telephone call to patient, no answer, message states voicemail not set up, and unable to leave a message.   Telephone call from patient, verify patient's name, date of birth, and address. Patient in agreement to outreach and colon cancer screening (CRC) follow up. Discussed patient's colon cancer screening history per chart review.  Patient states she has never had a colonoscopy. Patient states she does not recall receiving information from [facility] to schedule colonoscopy. States her mobile phone will block unknown numbers and this may have been the reason she did not receive the information. Discussed CRC screening overview for colonoscopy, importance of CRC screening, transportation requirements, 24 hour caregiver requirements, patient voices understanding, and is in agreement.  Patient verbally given [facility]'s contact  number [phone]), states she will call to schedule follow up appointment for constipation and colonoscopy. Patient states she no barriers to obtaining colonoscopy.  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 States she will also contact Dr. Millard office regarding cholesterol medication questions and regarding refill.  Advised patient will also send a message to Dr. Newlin regarding updated referral for colonoscopy and cholesterol medicine question. No further HEPM interventions needed at this time.   Angelli Baruch H. Fell BURKITT, CHARITY FUNDRAISER, [facility] [phone]

## 2023-05-27 NOTE — Addendum Note (Signed)
 Addended by: Hoy Register on: 05/27/2023 11:27 AM   Modules accepted: Orders

## 2023-05-27 NOTE — Telephone Encounter (Signed)
 Patient aware of colonoscopy referral

## 2023-05-27 NOTE — Telephone Encounter (Signed)
 Colonoscopy referral has been placed.  In the future please feel free to go ahead and place colonoscopy referral.  Thank you.

## 2023-06-01 ENCOUNTER — Telehealth: Payer: Self-pay | Admitting: Internal Medicine

## 2023-06-01 NOTE — Telephone Encounter (Signed)
 Please call PT about revising a excuse note we sent to her. Her # is [phone]  Also, she needs Darilyn to ret her call for FMLA ppwk.

## 2023-06-03 ENCOUNTER — Telehealth: Payer: Self-pay | Admitting: Internal Medicine

## 2023-06-03 NOTE — Telephone Encounter (Signed)
 Returned patient's call and left a voice message letting her know we have not received any FMLA paperwork from her employer.  I also gave her the office fax number that she can provide to her HR department so they can send us  the forms.  I also left my direct call back number.

## 2023-06-07 NOTE — Telephone Encounter (Signed)
 Triage please see PT note about revising her excuse note. Darilyn did her part already. TY.

## 2023-06-11 NOTE — Telephone Encounter (Signed)
A work note was written for patient 04/26/23. She was placed on work restriction. She is now ready to resume her normal schedule and request a new work note. She will come pick up at the office when available.

## 2023-06-14 ENCOUNTER — Encounter: Payer: Self-pay | Admitting: *Deleted

## 2023-06-14 NOTE — Telephone Encounter (Signed)
ATC patient x1.  LVM that letter was ready for pick up.  Sent [employer].

## 2023-06-14 NOTE — Telephone Encounter (Signed)
Letter printed and placed up front.  

## 2023-06-21 NOTE — Telephone Encounter (Signed)
I tried to call as well. Two attempts. Closing encounter.

## 2023-06-29 ENCOUNTER — Encounter (HOSPITAL_BASED_OUTPATIENT_CLINIC_OR_DEPARTMENT_OTHER): Payer: Managed Care, Other (non HMO)

## 2023-07-04 ENCOUNTER — Other Ambulatory Visit: Payer: Self-pay | Admitting: Physician Assistant

## 2023-07-04 DIAGNOSIS — I10 Essential (primary) hypertension: Secondary | ICD-10-CM

## 2023-07-05 NOTE — Telephone Encounter (Signed)
 Requested Prescriptions  Pending Prescriptions Disp Refills   amLODipine  (NORVASC ) 10 MG tablet [Pharmacy Med Name: AMLODIPINE  BESYLATE 10MG  TABLETS] 90 tablet 0    Sig: TAKE 1 TABLET BY MOUTH EVERY DAY     Cardiovascular: Calcium  Channel Blockers 2 Passed - 07/05/2023  2:23 PM      Passed - Last BP in normal range    BP Readings from Last 1 Encounters:  04/26/23 132/76         Passed - Last Heart Rate in normal range    Pulse Readings from Last 1 Encounters:  04/26/23 72         Passed - Valid encounter within last 6 months    Recent Outpatient Visits           2 months ago Chronic obstructive pulmonary disease, unspecified COPD type (HCC)   [facility] [place]. [facility] [place], [place]   7 months ago Chronic obstructive pulmonary disease, unspecified COPD type [facility])   [facility] - [facility] Dept Of [place]. [facility] [place], [place], [place]   1 year ago Essential hypertension   [facility] - [facility] Dept Of [place]. [facility] Valente Gaskin, RPH-CPP   1 year ago Frequent urination   [facility] [place] - A Dept Of [place]. [facility] Lawrance Presume, MD   2 years ago Depression, unspecified depression type   [facility] - A Dept Of [place]. [facility] [place], [place], [place]       Future Appointments             In 1 month Joaquin Mulberry, MD [facility] [place] - A Dept Of [place]. [facility]

## 2023-07-07 ENCOUNTER — Encounter (HOSPITAL_BASED_OUTPATIENT_CLINIC_OR_DEPARTMENT_OTHER): Payer: Managed Care, Other (non HMO)

## 2023-07-21 ENCOUNTER — Emergency Department (HOSPITAL_COMMUNITY)
Admission: EM | Admit: 2023-07-21 | Discharge: 2023-07-21 | Disposition: A | Discharge status: Home / Self Care | Payer: Managed Care, Other (non HMO) | Attending: Emergency Medicine | Admitting: Emergency Medicine

## 2023-07-21 ENCOUNTER — Encounter (HOSPITAL_COMMUNITY): Payer: Self-pay

## 2023-07-21 ENCOUNTER — Other Ambulatory Visit: Payer: Self-pay

## 2023-07-21 DIAGNOSIS — J449 Chronic obstructive pulmonary disease, unspecified: Secondary | ICD-10-CM | POA: Diagnosis not present

## 2023-07-21 DIAGNOSIS — R059 Cough, unspecified: Secondary | ICD-10-CM | POA: Diagnosis present

## 2023-07-21 DIAGNOSIS — J069 Acute upper respiratory infection, unspecified: Secondary | ICD-10-CM | POA: Insufficient documentation

## 2023-07-21 MED ORDER — DOXYCYCLINE HYCLATE 100 MG PO CAPS: 100.0000 mg | ORAL_CAPSULE | Freq: Two times a day (BID) | ORAL | 0 refills | Status: DC

## 2023-07-21 MED ORDER — PREDNISONE 20 MG PO TABS: ORAL_TABLET | ORAL | 0 refills | Status: DC

## 2023-07-21 NOTE — ED Triage Notes (Signed)
 Patient is here for evaluation of flu like symptoms X 2 weeks. Reports congestion and non productive cough. Reports generalized feeling bad.

## 2023-07-21 NOTE — Discharge Instructions (Signed)
 Take tylenol 2 pills 4 times a day and motrin 4 pills 3 times a day.  Drink plenty of fluids.  Return for worsening shortness of breath, headache, confusion. Follow up with your family doctor.

## 2023-07-21 NOTE — ED Provider Notes (Signed)
 [place] EMERGENCY DEPARTMENT AT [facility] Provider Note   CSN: [id number] Arrival date & time: 07/21/23  0757     History  Chief Complaint  Patient presents with   Nasal Congestion   Cough    Marissa Owens is a 63 y.o. female.  63 yo F with a chief complaints of cough congestion difficulty breathing.  This been going on for a couple weeks.  She has a history of COPD and feels like she has had to use her inhaler more often than normal.  At 1 point she said she did 6 puffs at a time and felt a bit lightheaded and dizzy.  She think she has been using her inhaler about 3 times a day.  Did not use it prior to coming here.  She has been able to eat and drink without issue.   Cough      Home Medications Prior to Admission medications   Medication Sig Start Date End Date Taking? Authorizing Provider  doxycycline (VIBRAMYCIN) 100 MG capsule Take 1 capsule (100 mg total) by mouth 2 (two) times daily. One po bid x 7 days 07/21/23  Yes Melene Plan, DO  predniSONE (DELTASONE) 20 MG tablet 2 tabs po daily x 5 days 07/21/23  Yes Melene Plan, DO  albuterol (PROVENTIL) (2.5 MG/3ML) 0.083% nebulizer solution Take 3 mLs (2.5 mg total) by nebulization every 6 (six) hours as needed for wheezing or shortness of breath. 04/26/23   Charlott Holler, MD  albuterol (VENTOLIN HFA) 108 (90 Base) MCG/ACT inhaler Inhale 2 puffs into the lungs every 6 (six) hours as needed for wheezing or shortness of breath. 04/07/23   Anders Simmonds, PA-C  amLODipine (NORVASC) 10 MG tablet TAKE 1 TABLET BY MOUTH EVERY DAY 07/05/23   Hoy Register, MD  atorvastatin (LIPITOR) 10 MG tablet Take 1 tablet (10 mg total) by mouth daily. 12/02/22   Anders Simmonds, PA-C  Budeson-Glycopyrrol-Formoterol (BREZTRI AEROSPHERE) 160-9-4.8 MCG/ACT AERO Inhale 2 puffs into the lungs in the morning and at bedtime. 04/26/23   Charlott Holler, MD  buPROPion [facility] SR) 150 MG 12 hr tablet Take 1 tablet (150 mg total) by  mouth 2 (two) times daily. 04/26/23   Charlott Holler, MD  diclofenac Sodium (VOLTAREN) 1 % GEL Apply 4 g topically 4 (four) times daily. 11/18/22   Tilden Fossa, MD  divalproex (DEPAKOTE ER) 500 MG 24 hr tablet Take 1,000 mg by mouth at bedtime. 02/13/21   [provider]  methocarbamol (ROBAXIN) 500 MG tablet Take 1 tablet (500 mg total) by mouth every 8 (eight) hours as needed for muscle spasms. 04/27/23   Hoy Register, MD  naproxen (NAPROSYN) 500 MG tablet TAKE 1 TABLET(500 MG) BY MOUTH TWICE DAILY WITH A MEAL FOR 7 DAYS AS NEEDED FOR PAIN 05/05/23   Hoy Register, MD  polyethylene glycol powder (GLYCOLAX/MIRALAX) 17 GM/SCOOP powder Take 17 g by mouth 2 (two) times daily as needed. 04/07/23   Anders Simmonds, PA-C  Respiratory Therapy Supplies (ADULT MASK) MISC Needs an mask for her nebulizer machine 07/03/19   Fulp, Cammie, MD  risperiDONE (RISPERDAL) 2 MG tablet Take 1 tablet (2 mg total) by mouth 2 (two) times daily. 01/10/19   Eustace Moore, MD  sertraline (ZOLOFT) 100 MG tablet Take 100 mg by mouth daily. 01/22/22   [provider]      Allergies    Patient has no known allergies.    Review of Systems  Review of Systems  Respiratory:  Positive for cough.     Physical Exam Updated Vital Signs BP (!) 155/106 (BP Location: Left Arm)   Pulse 88   Temp 98.3 F (36.8 C) (Oral)   Resp 16   Ht 5\' 1"  (1.549 m)   Wt 70.3 kg   SpO2 100%   BMI 29.28 kg/m  Physical Exam Vitals and nursing note reviewed.  Constitutional:      General: She is not in acute distress.    Appearance: She is well-developed. She is not diaphoretic.  HENT:     Head: Normocephalic and atraumatic.     Comments: Swollen turbinates, posterior nasal drip,  tm normal bilaterally.   Eyes:     Pupils: Pupils are equal, round, and reactive to light.  Cardiovascular:     Rate and Rhythm: Normal rate and regular rhythm.     Heart sounds: No murmur heard.    No friction rub. No gallop.   Pulmonary:     Effort: Pulmonary effort is normal.     Breath sounds: No wheezing or rales.  Abdominal:     General: There is no distension.     Palpations: Abdomen is soft.     Tenderness: There is no abdominal tenderness.  Musculoskeletal:        General: No tenderness.     Cervical back: Normal range of motion and neck supple.  Skin:    General: Skin is warm and dry.  Neurological:     Mental Status: She is alert and oriented to person, place, and time.  Psychiatric:        Behavior: Behavior normal.     ED Results / Procedures / Treatments   Labs (all labs ordered are listed, but only abnormal results are displayed) Labs Reviewed - No data to display  EKG None  Radiology No results found.  Procedures Procedures    Medications Ordered in ED Medications - No data to display  ED Course/ Medical Decision Making/ A&P                                 Medical Decision Making Risk Prescription drug management.   63 yo F with a chief complaints of cough congestion going on for a couple weeks now.  She tells me she feels like it is getting worse.  She has a history of COPD.  Her lung exam is benign for me.  I do not appreciate any wheezing there is no tachypnea.  She does have signs of an upper respiratory illness.  Will start on antibiotics and steroids.  PCP follow-up.  8:14 AM:  I have discussed the diagnosis/risks/treatment options with the patient.  Evaluation and diagnostic testing in the emergency department does not suggest an emergent condition requiring admission or immediate intervention beyond what has been performed at this time.  They will follow up with PCP. We also discussed returning to the ED immediately if new or worsening sx occur. We discussed the sx which are most concerning (e.g., sudden worsening pain, fever, inability to tolerate by mouth) that necessitate immediate return. Medications administered to the patient during their visit and any new  prescriptions provided to the patient are listed below.  Medications given during this visit Medications - No data to display   The patient appears reasonably screen and/or stabilized for discharge and I doubt any other medical condition or other [facility] requiring further screening,  evaluation, or treatment in the ED at this time prior to discharge.          Final Clinical Impression(s) / ED Diagnoses Final diagnoses:  Upper respiratory tract infection, unspecified type    Rx / DC Orders ED Discharge Orders          Ordered    predniSONE (DELTASONE) 20 MG tablet        07/21/23 0812    doxycycline (VIBRAMYCIN) 100 MG capsule  2 times daily        07/21/23 0812              Melene Plan, DO 07/21/23 [id number]

## 2023-08-09 ENCOUNTER — Ambulatory Visit: Payer: Managed Care, Other (non HMO) | Admitting: Family Medicine

## 2023-08-31 ENCOUNTER — Other Ambulatory Visit: Payer: Self-pay | Admitting: Family Medicine

## 2023-08-31 DIAGNOSIS — G4440 Drug-induced headache, not elsewhere classified, not intractable: Secondary | ICD-10-CM

## 2023-09-29 ENCOUNTER — Ambulatory Visit: Admitting: Family Medicine

## 2023-11-05 ENCOUNTER — Other Ambulatory Visit

## 2023-11-08 ENCOUNTER — Other Ambulatory Visit (INDEPENDENT_AMBULATORY_CARE_PROVIDER_SITE_OTHER)

## 2023-11-08 ENCOUNTER — Other Ambulatory Visit

## 2023-11-08 ENCOUNTER — Ambulatory Visit: Attending: Family Medicine

## 2023-11-08 ENCOUNTER — Ambulatory Visit

## 2023-11-08 DIAGNOSIS — Z111 Encounter for screening for respiratory tuberculosis: Secondary | ICD-10-CM

## 2023-11-10 ENCOUNTER — Ambulatory Visit: Attending: Family Medicine

## 2023-11-10 DIAGNOSIS — Z111 Encounter for screening for respiratory tuberculosis: Secondary | ICD-10-CM

## 2023-11-10 LAB — TB SKIN TEST
Induration: 0 mm
TB Skin Test: NEGATIVE

## 2023-11-10 NOTE — Progress Notes (Signed)
 PPD Reading Note  PPD read and results entered in EpicCare.  Result: 0 mm induration.  Interpretation: negative

## 2023-11-12 ENCOUNTER — Ambulatory Visit

## 2023-11-23 ENCOUNTER — Ambulatory Visit: Admitting: Family Medicine

## 2023-12-13 ENCOUNTER — Ambulatory Visit: Payer: Self-pay | Admitting: Internal Medicine

## 2023-12-13 NOTE — Telephone Encounter (Signed)
FYI - pt has appt tomorrow with you.

## 2023-12-13 NOTE — Telephone Encounter (Signed)
 FYI Only or Action Required?: FYI only for provider.  Patient is followed in Pulmonology for COPD, last seen on 04/26/2023 by Meade Verdon RAMAN, MD.  Called Nurse Triage reporting Cough.  Symptoms began a week ago.  Interventions attempted: Rescue inhaler, Maintenance inhaler, Nebulizer treatments, and Increased fluids/rest.  Symptoms are: unchanged.  Triage Disposition: See Physician Within 24 Hours  Patient/caregiver understands and will follow disposition?: Yes  Copied from CRM [id number]. Topic: Clinical - Red Word Triage >> Dec 13, 2023 10:07 AM Rozanna MATSU wrote: Red Word that prompted transfer to Nurse Triage: PT STATED SHE HAS SOME CONGESTIONS, COUGHING, CHEST PAIN Reason for Disposition  SEVERE coughing spells (e.g., whooping sound after coughing, vomiting after coughing)  Answer Assessment - Initial Assessment Questions 1. ONSET: When did the cough begin?      Started a week ago 2. SEVERITY: How bad is the cough today?      Moderate-worse at night 3. SPUTUM: Describe the color of your sputum (e.g., none, dry cough; clear, white, yellow, green)     Not coughing anything up 4. HEMOPTYSIS: Are you coughing up any blood? If Yes, ask: How much? (e.g., flecks, streaks, tablespoons, etc.)     no 5. DIFFICULTY BREATHING: Are you having difficulty breathing? If Yes, ask: How bad is it? (e.g., mild, moderate, severe)      moderate 6. FEVER: Do you have a fever? If Yes, ask: What is your temperature, how was it measured, and when did it start?     no 7. CARDIAC HISTORY: Do you have any history of heart disease? (e.g., heart attack, congestive heart failure)      no 8. LUNG HISTORY: Do you have any history of lung disease?  (e.g., pulmonary embolus, asthma, emphysema)     COPD, smoker 9. PE RISK FACTORS: Do you have a history of blood clots? (or: recent major surgery, recent prolonged travel, bedridden)     no 10. OTHER SYMPTOMS: Do you have any other  symptoms? (e.g., runny nose, wheezing, chest pain)       congestion 12. TRAVEL: Have you traveled out of the country in the last month? (e.g., travel history, exposures)       No  Patient with hx of COPD-patient's pulse oximeter reading today is 94%. Patient reports breathing treatments are helping.  Protocols used: Cough - Acute Productive-A-AH

## 2023-12-14 ENCOUNTER — Encounter: Payer: Self-pay | Admitting: Adult Health

## 2023-12-14 ENCOUNTER — Ambulatory Visit: Admitting: Adult Health

## 2023-12-15 ENCOUNTER — Ambulatory Visit: Admitting: Family Medicine

## 2023-12-19 ENCOUNTER — Other Ambulatory Visit: Payer: Self-pay | Admitting: Family Medicine

## 2023-12-19 DIAGNOSIS — I10 Essential (primary) hypertension: Secondary | ICD-10-CM

## 2023-12-20 ENCOUNTER — Other Ambulatory Visit: Payer: Self-pay | Admitting: Family Medicine

## 2023-12-20 DIAGNOSIS — I10 Essential (primary) hypertension: Secondary | ICD-10-CM

## 2023-12-20 NOTE — Telephone Encounter (Signed)
 Copied from CRM [id number]. Topic: Clinical - Medication Refill >> Dec 20, 2023  2:42 PM Wess RAMAN wrote: Medication: amLODipine  (NORVASC ) 10 MG tablet  Has the patient contacted their pharmacy? Yes (Agent: If no, request that the patient contact the pharmacy for the refill. If patient does not wish to contact the pharmacy document the reason why and proceed with request.) (Agent: If yes, when and what did the pharmacy advise?)  This is the patient's preferred pharmacy:   [id number] [place], [place] - [address] AT [facility] OF [facility] & [address] [place] [address] Phone: [phone] Fax: [phone]  Is this the correct pharmacy for this prescription? Yes If no, delete pharmacy and type the correct one.   Has the prescription been filled recently? Yes  Is the patient out of the medication? Yes  Has the patient been seen for an appointment in the last year OR does the patient have an upcoming appointment? Yes  Can we respond through MyChart? Yes  Agent: Please be advised that Rx refills may take up to 3 business days. We ask that you follow-up with your pharmacy.

## 2023-12-21 MED ORDER — AMLODIPINE BESYLATE 10 MG PO TABS: 10.0000 mg | ORAL_TABLET | Freq: Every day | ORAL | 0 refills | Status: DC

## 2023-12-21 NOTE — Telephone Encounter (Signed)
 Requested Prescriptions  Pending Prescriptions Disp Refills   amLODipine  (NORVASC ) 10 MG tablet 30 tablet 0    Sig: Take 1 tablet (10 mg total) by mouth daily.     Cardiovascular: Calcium  Channel Blockers 2 Failed - 12/21/2023  4:11 PM      Failed - Last BP in normal range    BP Readings from Last 1 Encounters:  07/21/23 (!) 155/106         Failed - Valid encounter within last 6 months    Recent Outpatient Visits           8 months ago Chronic obstructive pulmonary disease, unspecified COPD type (HCC)   [facility] [place]. [facility] [place], [place], [place]   1 year ago Chronic obstructive pulmonary disease, unspecified COPD type [facility])   [facility] Of [place]. [facility] Slaughters, Jon HERO, [place]   1 year ago Essential hypertension   [facility] Of [place]. [facility] Fleeta Tonia Garnette LITTIE, RPH-CPP   1 year ago Frequent urination   [facility] Of [place]. [facility] Vicci Barnie NOVAK, MD   2 years ago Depression, unspecified depression type   [facility] [place] - [facility] Dept Of [place]. [facility] Mayers, Kirk RAMAN, PA-C       Future Appointments             In 6 days Theotis Haze ORN, NP [facility] - A Dept Of [place]. [facility]            Passed - Last Heart Rate in normal range    Pulse Readings from Last 1 Encounters:  07/21/23 88

## 2023-12-24 ENCOUNTER — Telehealth: Payer: Self-pay | Admitting: Family Medicine

## 2023-12-24 NOTE — Telephone Encounter (Signed)
 Called patient, no answer. Left voicemail confirming upcoming appointment on 12/27/2023 Provided callback number for any questions or changes.

## 2023-12-27 ENCOUNTER — Ambulatory Visit: Attending: Nurse Practitioner | Admitting: Nurse Practitioner

## 2023-12-27 ENCOUNTER — Encounter: Payer: Self-pay | Admitting: Nurse Practitioner

## 2023-12-27 VITALS — BP 161/103 | HR 78 | Resp 19 | Ht 61.0 in | Wt 150.4 lb

## 2023-12-27 DIAGNOSIS — F329 Major depressive disorder, single episode, unspecified: Secondary | ICD-10-CM

## 2023-12-27 DIAGNOSIS — E785 Hyperlipidemia, unspecified: Secondary | ICD-10-CM

## 2023-12-27 DIAGNOSIS — F17200 Nicotine dependence, unspecified, uncomplicated: Secondary | ICD-10-CM

## 2023-12-27 DIAGNOSIS — N3281 Overactive bladder: Secondary | ICD-10-CM | POA: Diagnosis not present

## 2023-12-27 DIAGNOSIS — F17210 Nicotine dependence, cigarettes, uncomplicated: Secondary | ICD-10-CM

## 2023-12-27 DIAGNOSIS — R350 Frequency of micturition: Secondary | ICD-10-CM

## 2023-12-27 DIAGNOSIS — W57XXXA Bitten or stung by nonvenomous insect and other nonvenomous arthropods, initial encounter: Secondary | ICD-10-CM

## 2023-12-27 DIAGNOSIS — J441 Chronic obstructive pulmonary disease with (acute) exacerbation: Secondary | ICD-10-CM

## 2023-12-27 DIAGNOSIS — R7303 Prediabetes: Secondary | ICD-10-CM

## 2023-12-27 DIAGNOSIS — D582 Other hemoglobinopathies: Secondary | ICD-10-CM

## 2023-12-27 MED ORDER — BUPROPION HCL ER (SR) 150 MG PO TB12: 150.0000 mg | ORAL_TABLET | Freq: Two times a day (BID) | ORAL | 5 refills | Status: AC

## 2023-12-27 MED ORDER — ATORVASTATIN CALCIUM 10 MG PO TABS: 10.0000 mg | ORAL_TABLET | Freq: Every day | ORAL | 1 refills | Status: DC

## 2023-12-27 MED ORDER — PREDNISONE 20 MG PO TABS: 40.0000 mg | ORAL_TABLET | Freq: Every day | ORAL | 0 refills | Status: AC

## 2023-12-27 MED ORDER — HYDROCORTISONE 2.5 % EX CREA: TOPICAL_CREAM | Freq: Two times a day (BID) | CUTANEOUS | 1 refills | Status: AC

## 2023-12-27 NOTE — Progress Notes (Unsigned)
 COPD worse; has pulmonologist  Allergic reaction; hive started 2 days ago.

## 2023-12-27 NOTE — Progress Notes (Unsigned)
 Assessment & Plan:  [name] was seen today for hypertension.  Diagnoses and all orders for this visit:  COPD exacerbation (HCC) -     predniSONE  (DELTASONE ) 20 MG tablet; Take 2 tablets (40 mg total) by mouth daily for 5 days. Chronic COPD. Albuterol  inhaler insufficient. Maintenance inhaler needed to prevent shortness of breath and reduce rescue inhaler use. - Prescribe prednisone  for exacerbation. - Advise contacting pulmonologist for affordable maintenance inhaler options. - Refill bupropion  for smoking cessation. - Discussed nebulizer use as backup.   Dyslipidemia -     atorvastatin  (LIPITOR) 10 MG tablet; Take 1 tablet (10 mg total) by mouth daily. INSTRUCTIONS: Work on a low fat, heart healthy diet and participate in regular aerobic exercise program by working out at least 150 minutes per week; 5 days a week-30 minutes per day. Avoid red meat/beef/steak,  fried foods. junk foods, sodas, sugary drinks, unhealthy snacking, alcohol and smoking.  Drink at least 80 oz of water per day and monitor your carbohydrate intake daily.    Urine frequency -     Hemoglobin A1c -     Urinalysis, Complete -     Urine Culture Urinary frequency Chronic urinary frequency, possible overactive bladder. Differential includes diabetes or UTI. - Order A1c test for diabetes. - Order urinalysis and urine culture for UTI. If negative will send myrbetriq   Prediabetes -     Hemoglobin A1c  Elevated hemoglobin (HCC) -     CBC with Differential  Bedbug bite, initial encounter -     hydrocortisone  2.5 % cream; Apply topically 2 (two) times daily.  Tobacco dependence -     buPROPion  (WELLBUTRIN  SR) 150 MG 12 hr tablet; Take 1 tablet (150 mg total) by mouth 2 (two) times daily.n   Major depressive disorder Ongoing major depressive disorder. Concerns about Wellbutrin  side effects, specifically suicidal ideation. Discussed potential side effect but not immediate. - Refill bupropion . - Reassure safety  with sertraline. - Encourage reporting unusual side effects.   Patient has been counseled on age-appropriate routine health concerns for screening and prevention. These are reviewed and up-to-date. Referrals have been placed accordingly. Immunizations are up-to-date or declined.    Subjective:   Chief Complaint  Patient presents with   Hypertension    History of Present Illness Marissa Owens is a 63 year old female with COPD who presents with rash, COPD exacerbation and difficulty managing her symptoms  She is a patient of Dr Delbert  She has been experiencing difficulty managing her COPD symptoms and experiencing persistent deep cough. She had to be prescribed prednisone  at her last hospitalization in February. She is currently using an albuterol  inhaler excessively and states she could not afford her prescribed Breztri  inhaler, which is supposed to be taking as two puffs in the morning and two puffs in the evening. She has not informed her pulmonologist about the affordability issue and missed her last appointment due to a scheduling conflict. She smokes approximately ten cigarettes a day and has had a lung CT this year, which showed no lung masses.  She has a rash on the left chest, which closely resemble insect bites along with scratch marks.  She experiences urinary frequency, urinating every five minutes, which has been ongoing for quite some time. She has not spoken to her PCP about this.  Her last A1c was 6.0 and has never been higher than 6.4 over the past five years. She uses pads for urinary incontinence but has not discussed this with  her primary care provider or been referred to a urologist.  She is currently on sertraline for depression, prescribed by her behavioral health provider, and has concerns about starting bupropion  due to potential side effects. 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   Review of Systems  Constitutional:  Negative for fever, malaise/fatigue and weight loss.  HENT: Negative.  Negative for nosebleeds.   Eyes: Negative.  Negative for blurred vision, double vision and photophobia.  Respiratory:  Positive for cough and sputum production. Negative for shortness of breath.   Cardiovascular: Negative.  Negative for chest pain, palpitations and leg swelling.  Gastrointestinal: Negative.  Negative for heartburn, nausea and vomiting.  Genitourinary:  Positive for frequency.  Musculoskeletal: Negative.  Negative for myalgias.  Skin:  Positive for itching and rash.  Neurological: Negative.  Negative for dizziness, focal weakness, seizures and headaches.  Psychiatric/Behavioral:  Positive for depression. Negative for suicidal ideas.     Past Medical History:  Diagnosis Date   COPD (chronic obstructive pulmonary disease) (HCC)    Diabetes mellitus without complication (HCC)    Hypertension    Renal disorder    kidney stone    Past Surgical History:  Procedure Laterality Date   TUBAL LIGATION      Family History  Problem Relation Age of Onset   Cancer Mother    Hypertension Mother    Lung cancer Brother     Social History Reviewed with no changes to be made today.   Outpatient Medications Prior to Visit  Medication Sig Dispense Refill   albuterol  (VENTOLIN  HFA) 108 (90 Base) MCG/ACT inhaler Inhale 2 puffs into the lungs every 6 (six) hours as needed for wheezing or shortness of breath. 26.8 g 2   amLODipine  (NORVASC ) 10 MG tablet Take 1 tablet (10 mg total) by mouth daily. 30 tablet 0   albuterol  (PROVENTIL ) (2.5 MG/3ML) 0.083% nebulizer solution Take 3 mLs (2.5 mg total) by nebulization every 6 (six) hours as needed for wheezing or shortness of breath. (Patient not taking: Reported on 12/27/2023) 75 mL 12   Budeson-Glycopyrrol-Formoterol (BREZTRI  AEROSPHERE) 160-9-4.8 MCG/ACT AERO Inhale 2 puffs into the lungs in the morning and at bedtime.  (Patient not taking: Reported on 12/27/2023)     divalproex  (DEPAKOTE  ER) 500 MG 24 hr tablet Take 1,000 mg by mouth at bedtime. (Patient not taking: Reported on 12/27/2023)     Respiratory Therapy Supplies (ADULT MASK) MISC Needs an mask for her nebulizer machine 1 each 0   sertraline (ZOLOFT) 100 MG tablet Take 100 mg by mouth daily. (Patient not taking: Reported on 12/27/2023)     atorvastatin  (LIPITOR) 10 MG tablet Take 1 tablet (10 mg total) by mouth daily. (Patient not taking: Reported on 12/27/2023) 90 tablet 1   buPROPion  (WELLBUTRIN  SR) 150 MG 12 hr tablet Take 1 tablet (150 mg total) by mouth 2 (two) times daily. (Patient not taking: Reported on 12/27/2023) 60 tablet 5   diclofenac  Sodium (VOLTAREN ) 1 % GEL Apply 4 g topically 4 (four) times daily. (Patient not taking: Reported on 12/27/2023) 100 g 0   doxycycline  (VIBRAMYCIN ) 100 MG capsule Take 1 capsule (100 mg total) by mouth 2 (two) times daily. One po bid x 7 days (Patient not taking: Reported on 12/27/2023) 14 capsule 0   methocarbamol  (ROBAXIN ) 500 MG tablet Take 1 tablet (500 mg total) by mouth every 8 (eight) hours as needed for muscle spasms. (Patient not taking: Reported  on 12/27/2023) 90 tablet 0   naproxen  (NAPROSYN ) 500 MG tablet TAKE 1 TABLET(500 MG) BY MOUTH TWICE DAILY WITH A MEAL FOR 7 DAYS AS NEEDED FOR PAIN (Patient not taking: Reported on 12/27/2023) 60 tablet 0   polyethylene glycol powder (GLYCOLAX /MIRALAX ) 17 GM/SCOOP powder Take 17 g by mouth 2 (two) times daily as needed. (Patient not taking: Reported on 12/27/2023) 3350 g 1   predniSONE  (DELTASONE ) 20 MG tablet 2 tabs po daily x 5 days (Patient not taking: Reported on 12/27/2023) 10 tablet 0   risperiDONE  (RISPERDAL ) 2 MG tablet Take 1 tablet (2 mg total) by mouth 2 (two) times daily. (Patient not taking: Reported on 12/27/2023) 60 tablet 2   No facility-administered medications prior to visit.    No Known Allergies     Objective:    BP (!) 161/103 (BP Location: Right Arm, Patient  Position: Sitting, Cuff Size: Normal)   Pulse 78   Resp 19   Ht 5' 1 (1.549 m)   Wt 150 lb 6.4 oz (68.2 kg)   SpO2 100%   BMI 28.42 kg/m  Wt Readings from Last 3 Encounters:  12/27/23 150 lb 6.4 oz (68.2 kg)  07/21/23 154 lb 15.7 oz (70.3 kg)  04/26/23 155 lb (70.3 kg)    Physical Exam Vitals and nursing note reviewed.  Constitutional:      Appearance: She is well-developed.  HENT:     Head: Normocephalic and atraumatic.  Cardiovascular:     Rate and Rhythm: Normal rate and regular rhythm.     Heart sounds: Normal heart sounds. No murmur heard.    No friction rub. No gallop.  Pulmonary:     Effort: Pulmonary effort is normal. No tachypnea or respiratory distress.     Breath sounds: Wheezing present. No decreased breath sounds, rhonchi or rales.  Chest:     Chest wall: No tenderness.  Abdominal:     General: Bowel sounds are normal.     Palpations: Abdomen is soft.  Musculoskeletal:        General: Normal range of motion.     Cervical back: Normal range of motion.  Skin:    General: Skin is warm and dry.     Findings: Rash present. Rash is macular.      Neurological:     Mental Status: She is alert and oriented to person, place, and time.     Coordination: Coordination normal.  Psychiatric:        Behavior: Behavior normal. Behavior is cooperative.        Thought Content: Thought content normal.        Judgment: Judgment normal.          Patient has been counseled extensively about nutrition and exercise as well as the importance of adherence with medications and regular follow-up. The patient was given clear instructions to go to ER or return to medical center if symptoms don't improve, worsen or new problems develop. The patient verbalized understanding.   Follow-up: Return if symptoms worsen or fail to improve.   Marissa LELON Servant, FNP-BC [facility] [place], [place] [phone]   12/28/2023, 8:56 PM

## 2023-12-28 ENCOUNTER — Ambulatory Visit: Payer: Self-pay | Admitting: Nurse Practitioner

## 2023-12-28 LAB — URINALYSIS, COMPLETE
Bilirubin, UA: NEGATIVE
Glucose, UA: NEGATIVE
Ketones, UA: NEGATIVE
Nitrite, UA: NEGATIVE
Protein,UA: NEGATIVE
RBC, UA: NEGATIVE
Specific Gravity, UA: 1.011 (ref 1.005–1.030)
Urobilinogen, Ur: 0.2 mg/dL (ref 0.2–1.0)
pH, UA: 6 (ref 5.0–7.5)

## 2023-12-28 LAB — CBC WITH DIFFERENTIAL/PLATELET
Basophils Absolute: 0.1 10*3/uL (ref 0.0–0.2)
Basos: 1 %
EOS (ABSOLUTE): 0.2 10*3/uL (ref 0.0–0.4)
Eos: 3 %
Hematocrit: 47.5 % — ABNORMAL HIGH (ref 34.0–46.6)
Hemoglobin: 15.1 g/dL (ref 11.1–15.9)
Immature Grans (Abs): 0 10*3/uL (ref 0.0–0.1)
Immature Granulocytes: 0 %
Lymphocytes Absolute: 3.5 10*3/uL — ABNORMAL HIGH (ref 0.7–3.1)
Lymphs: 46 %
MCH: 30.9 pg (ref 26.6–33.0)
MCHC: 31.8 g/dL (ref 31.5–35.7)
MCV: 97 fL (ref 79–97)
Monocytes Absolute: 0.7 10*3/uL (ref 0.1–0.9)
Monocytes: 9 %
Neutrophils Absolute: 3.1 10*3/uL (ref 1.4–7.0)
Neutrophils: 41 %
Platelets: 180 10*3/uL (ref 150–450)
RBC: 4.89 x10E6/uL (ref 3.77–5.28)
RDW: 12.7 % (ref 11.7–15.4)
WBC: 7.5 10*3/uL (ref 3.4–10.8)

## 2023-12-28 LAB — MICROSCOPIC EXAMINATION
Bacteria, UA: NONE SEEN
Casts: NONE SEEN /LPF
RBC, Urine: NONE SEEN /HPF (ref 0–2)

## 2023-12-28 LAB — HEMOGLOBIN A1C
Est. average glucose Bld gHb Est-mCnc: 120 mg/dL
Hgb A1c MFr Bld: 5.8 % — ABNORMAL HIGH (ref 4.8–5.6)

## 2023-12-28 MED ORDER — MIRABEGRON ER 25 MG PO TB24: 25.0000 mg | ORAL_TABLET | Freq: Every day | ORAL | 0 refills | Status: AC

## 2023-12-29 LAB — URINE CULTURE

## 2023-12-31 ENCOUNTER — Telehealth: Payer: Self-pay | Admitting: Family Medicine

## 2023-12-31 NOTE — Telephone Encounter (Signed)
 Attempted to contact patient to get more information on medication names. LVM for return call.

## 2023-12-31 NOTE — Telephone Encounter (Signed)
 Copied from CRM [id number]. Topic: Clinical - Prescription Issue >> Dec 31, 2023 12:24 PM DeAngela L wrote: Reason for CRM: patient calling to inform the Dr Isac office that [facility] pharmacy can't fill her new prescription that was sent on 12/27/23 after her appt and she is asking if the office could send the new prescription to [facility] pharmacy instead   [id number] - [place] ([place]), [place] - [address] [place] ([place]) [place] [address] Phone: [phone] Fax: [phone]  Pt num [phone] (M)

## 2024-01-17 ENCOUNTER — Encounter: Payer: Self-pay | Admitting: Pulmonary Disease

## 2024-01-17 ENCOUNTER — Ambulatory Visit (INDEPENDENT_AMBULATORY_CARE_PROVIDER_SITE_OTHER): Admitting: Pulmonary Disease

## 2024-01-17 VITALS — BP 134/80 | HR 79 | Temp 98.7°F | Ht 62.0 in | Wt 153.0 lb

## 2024-01-17 DIAGNOSIS — F17210 Nicotine dependence, cigarettes, uncomplicated: Secondary | ICD-10-CM

## 2024-01-17 DIAGNOSIS — J441 Chronic obstructive pulmonary disease with (acute) exacerbation: Secondary | ICD-10-CM

## 2024-01-17 MED ORDER — BREZTRI AEROSPHERE 160-9-4.8 MCG/ACT IN AERO: 2.0000 | INHALATION_SPRAY | Freq: Two times a day (BID) | RESPIRATORY_TRACT | Status: DC

## 2024-01-17 MED ORDER — PREDNISONE 10 MG PO TABS: 40.0000 mg | ORAL_TABLET | Freq: Every day | ORAL | 0 refills | Status: AC

## 2024-01-17 NOTE — Patient Instructions (Signed)
  VISIT SUMMARY: Today, you were seen for significant coughing and congestion that has been ongoing for several weeks. We discussed your current inhaler and medication use, as well as your desire to quit smoking.  YOUR PLAN: -COPD WITH ACUTE EXACERBATION AND EMPHYSEMA: COPD, or Chronic Obstructive Pulmonary Disease, is a long-term lung condition that makes it hard to breathe. An acute exacerbation means your symptoms have suddenly worsened. We will prescribe a Breztri  inhaler and prednisone  to help manage your symptoms. Please contact your insurance for cost information on Breztri , and use albuterol  as needed. We will also provide samples of inhalers if available.  -TOBACCO USE DISORDER: Tobacco use disorder means you have a dependence on smoking. You expressed a strong desire to quit smoking. We recommend using nicotine  gum to help you quit. We also discussed the potential side effects of medications like bupropion  and Chantix, which can include mood changes.  INSTRUCTIONS: Please contact your insurance to check the cost of the Breztri  inhaler. Use the albuterol  inhaler as needed. Start using nicotine  gum to help with smoking cessation. Follow up with us  if you experience any side effects from the medications or if your symptoms do not improve.

## 2024-01-17 NOTE — Progress Notes (Signed)
 Marissa Owens    [id number]    1961/03/19  Primary Care Physician:Newlin, Corrina, MD  Referring Physician: Delbert Corrina, MD [address] [place] [address] [place],  [place] [address]  Chief complaint: Acute visit for dyspnea  HPI: 63 y.o. who  has a past medical history of COPD (chronic obstructive pulmonary disease) (HCC), Diabetes mellitus without complication (HCC), Hypertension, and Renal disorder.  Discussed the use of AI scribe software for clinical note transcription with the patient, who gave verbal consent to proceed.  History of Present Illness Marissa Owens is a 63 year old female with COPD and emphysema who presents with coughing and congestion.  She is a patient of Dr. Meade  Cough and respiratory congestion - Significant coughing and congestion for several weeks to one month - Cough is productive with clear or white mucus - No wheezing - Increased shortness of breath - No fevers or chills  Inhaler and medication use - Currently using albuterol  inhaler approximately six to seven times daily - Not using Breztri  inhaler due to cost concerns - Previously prescribed Trelegy but discontinued due to expense - Pharmacy is [facility])  Tobacco use and smoking cessation - Continues to smoke but expresses motivation to quit - Previously prescribed bupropion  for smoking cessation but did not take due to concerns about side effects - Prefers nicotine  gum over patches due to concerns about heart palpitations if smoking while using a patch    Outpatient Encounter Medications as of 01/17/2024  Medication Sig   albuterol  (VENTOLIN  HFA) 108 (90 Base) MCG/ACT inhaler Inhale 2 puffs into the lungs every 6 (six) hours as needed for wheezing or shortness of breath.   amLODipine  (NORVASC ) 10 MG tablet Take 1 tablet (10 mg total) by mouth daily.   atorvastatin  (LIPITOR) 10 MG tablet Take 1 tablet (10 mg total) by mouth daily.    hydrocortisone  2.5 % cream Apply topically 2 (two) times daily.   mirabegron  ER (MYRBETRIQ ) 25 MG TB24 tablet Take 1 tablet (25 mg total) by mouth daily.   Respiratory Therapy Supplies (ADULT MASK) MISC Needs an mask for her nebulizer machine   albuterol  (PROVENTIL ) (2.5 MG/3ML) 0.083% nebulizer solution Take 3 mLs (2.5 mg total) by nebulization every 6 (six) hours as needed for wheezing or shortness of breath. (Patient not taking: Reported on 12/27/2023)   Budeson-Glycopyrrol-Formoterol (BREZTRI  AEROSPHERE) 160-9-4.8 MCG/ACT AERO Inhale 2 puffs into the lungs in the morning and at bedtime. (Patient not taking: Reported on 01/17/2024)   buPROPion  (WELLBUTRIN  SR) 150 MG 12 hr tablet Take 1 tablet (150 mg total) by mouth 2 (two) times daily. (Patient not taking: Reported on 01/17/2024)   divalproex  (DEPAKOTE  ER) 500 MG 24 hr tablet Take 1,000 mg by mouth at bedtime. (Patient not taking: Reported on 01/17/2024)   sertraline (ZOLOFT) 100 MG tablet Take 100 mg by mouth daily. (Patient not taking: Reported on 01/17/2024)   No facility-administered encounter medications on file as of 01/17/2024.     Physical Exam: Today's Vitals   01/17/24 1508  BP: 134/80  Pulse: 79  Temp: 98.7 F (37.1 C)  TempSrc: Oral  Weight: 153 lb (69.4 kg)  Height: 5' 2 (1.575 m)  PF: 95 L/min   Body mass index is 27.98 kg/m.  Physical Exam GEN: No acute distress CV: Regular rate and rhythm no murmurs LUNGS: Clear to auscultation bilaterally normal respiratory effort SKIN JOINTS: Warm and dry no rash  Data Reviewed: Imaging: Lung cancer screening CT 05/24/2023-centrilobular emphysema, small scattered lung nodules measuring up to 5.9 mm I reviewed the images personally.  PFTs:  Labs:  Assessment and Plan Assessment & Plan COPD with acute exacerbation and emphysema COPD with acute exacerbation, characterized by increased coughing, congestion, and shortness of breath over the past month. Mucus is clear, and  there are no fevers or chills. She is not using the prescribed Breztri  inhaler due to cost concerns and is relying on albuterol  six to seven times a day. Physical examination reveals clear lung sounds. The exacerbation may benefit from prednisone , although its efficacy is uncertain. - Prescribe Breztri  inhaler and advise her to contact insurance for cost information - Provide samples of inhalers if available - Prescribe prednisone  to manage exacerbation - Advise her to use albuterol  as needed  Tobacco use disorder She continues to smoke but expresses a strong desire to quit. She previously declined bupropion  due to concerns about mood-related side effects. Prefers nicotine  gum over patches due to concerns about potential side effects when smoking with a patch.  Time spent counseling-4 minutes.  Reassess at return visit. - Advise use of nicotine  gum for smoking cessation - Discuss potential side effects of bupropion  and Chantix, including mood issues.  She would like to avoid these medications if possible.   Recommendations: Breztri  inhaler Prednisone  40 mg a day for 5 days Smoking cessation with nicotine  gum  Lonna Coder MD [facility] 01/17/2024, 3:23 PM  CC: Delbert Clam, MD

## 2024-01-19 ENCOUNTER — Ambulatory Visit: Admitting: Family Medicine

## 2024-02-23 ENCOUNTER — Other Ambulatory Visit: Payer: Self-pay

## 2024-02-23 ENCOUNTER — Encounter (HOSPITAL_COMMUNITY): Payer: Self-pay

## 2024-02-23 ENCOUNTER — Emergency Department (HOSPITAL_COMMUNITY)
Admission: EM | Admit: 2024-02-23 | Discharge: 2024-02-23 | Disposition: A | Discharge status: Home / Self Care | Attending: Emergency Medicine | Admitting: Emergency Medicine

## 2024-02-23 ENCOUNTER — Emergency Department (HOSPITAL_COMMUNITY)

## 2024-02-23 DIAGNOSIS — Z720 Tobacco use: Secondary | ICD-10-CM | POA: Insufficient documentation

## 2024-02-23 DIAGNOSIS — Z7951 Long term (current) use of inhaled steroids: Secondary | ICD-10-CM | POA: Insufficient documentation

## 2024-02-23 DIAGNOSIS — J441 Chronic obstructive pulmonary disease with (acute) exacerbation: Secondary | ICD-10-CM | POA: Insufficient documentation

## 2024-02-23 DIAGNOSIS — R519 Headache, unspecified: Secondary | ICD-10-CM | POA: Insufficient documentation

## 2024-02-23 DIAGNOSIS — Z59819 Housing instability, housed unspecified: Secondary | ICD-10-CM | POA: Insufficient documentation

## 2024-02-23 DIAGNOSIS — R059 Cough, unspecified: Secondary | ICD-10-CM | POA: Diagnosis present

## 2024-02-23 LAB — RESP PANEL BY RT-PCR (RSV, FLU A&B, COVID)  RVPGX2
Influenza A by PCR: NEGATIVE
Influenza B by PCR: NEGATIVE
Resp Syncytial Virus by PCR: NEGATIVE
SARS Coronavirus 2 by RT PCR: NEGATIVE

## 2024-02-23 MED ORDER — AMOXICILLIN-POT CLAVULANATE 875-125 MG PO TABS: 1.0000 | ORAL_TABLET | Freq: Two times a day (BID) | ORAL | 0 refills | Status: AC

## 2024-02-23 MED ORDER — PREDNISONE 20 MG PO TABS
60.0000 mg | ORAL_TABLET | Freq: Once | ORAL | Status: AC
  Administered 2024-02-23: 60 mg via ORAL
  Filled 2024-02-23: qty 3

## 2024-02-23 MED ORDER — PREDNISONE 20 MG PO TABS: 40.0000 mg | ORAL_TABLET | Freq: Every day | ORAL | 0 refills | Status: AC

## 2024-02-23 MED ORDER — IPRATROPIUM-ALBUTEROL 0.5-2.5 (3) MG/3ML IN SOLN
3.0000 mL | Freq: Once | RESPIRATORY_TRACT | Status: DC
  Filled 2024-02-23: qty 3

## 2024-02-23 NOTE — ED Triage Notes (Signed)
 Pt presents to ED from home C/O headache, fatigue, generalized body aches, cough.

## 2024-02-23 NOTE — ED Notes (Signed)
 Pt states she does not want the breathing treatment, she just wants her x-ray results.

## 2024-02-23 NOTE — Discharge Instructions (Addendum)
 Your symptoms today may be due to a COPD exacerbation.  Start Augmentin , 1 tablet by mouth twice daily for 5 days.  Start prednisone , 40 mg by mouth daily for 5 days, you received the first dose of this medication in the emergency department today, start your prednisone  on 10/2. Follow-up with your pulmonologist in regard to your symptoms. Return to the emergency department if your symptoms worsen.

## 2024-02-23 NOTE — ED Provider Notes (Signed)
 [place] [facility]ERG[facility] DEPARTMENT AT [facility] Provider Note   CSN: [id number] Arrival date & time: 02/23/24  9263     Patient presents with: Generalized Body Aches   Marissa Owens is a 63 y.o. female.   63 year old female presenting with flulike symptoms.  Patient reports that symptoms began yesterday with increasing excessive productive cough as well as headache, subjective chills but no she has not checked her temperature. Patient is followed by [facility] pulmonology for COPD, on Breztri /albuterol , was treated for a suspected COPD exacerbation with a 5-day course of prednisone  that she completed on 8/30.  Denies abdominal pain/nausea/vomiting/dysuria.         Prior to Admission medications   Medication Sig Start Date End Date Taking? Authorizing Provider  albuterol  (PROVENTIL ) (2.5 MG/3ML) 0.083% nebulizer solution Take 3 mLs (2.5 mg total) by nebulization every 6 (six) hours as needed for wheezing or shortness of breath. Patient not taking: Reported on 12/27/2023 04/26/23   Meade Verdon RAMAN, MD  albuterol  (VENTOLIN  HFA) 108 [id number] Base) MCG/ACT inhaler Inhale 2 puffs into the lungs every 6 (six) hours as needed for wheezing or shortness of breath. 04/07/23   Danton Jon HERO, PA-C  amLODipine  (NORVASC ) 10 MG tablet Take 1 tablet (10 mg total) by mouth daily. 12/21/23   Newlin, Enobong, MD  atorvastatin  (LIPITOR) 10 MG tablet Take 1 tablet (10 mg total) by mouth daily. 12/27/23   Fleming, Zelda W, NP  budesonide-glycopyrrolate-formoterol (BREZTRI  AEROSPHERE) 160-9-4.8 MCG/ACT AERO inhaler Inhale 2 puffs into the lungs in the morning and at bedtime. 01/17/24   Mannam, Praveen, MD  buPROPion  (WELLBUTRIN  SR) 150 MG 12 hr tablet Take 1 tablet (150 mg total) by mouth 2 (two) times daily. Patient not taking: Reported on 01/17/2024 12/27/23   Fleming, Zelda W, NP  divalproex  (DEPAKOTE  ER) 500 MG 24 hr tablet Take 1,000 mg by mouth at bedtime. Patient not taking: Reported on 01/17/2024  02/13/21   [provider]  hydrocortisone  2.5 % cream Apply topically 2 (two) times daily. 12/27/23   Fleming, Zelda W, NP  mirabegron  ER (MYRBETRIQ ) 25 MG TB24 tablet Take 1 tablet (25 mg total) by mouth daily. 12/28/23   Theotis Haze ORN, NP  Respiratory Therapy Supplies (ADULT MASK) MISC Needs an mask for her nebulizer machine 07/03/19   Fulp, Cammie, MD  sertraline (ZOLOFT) 100 MG tablet Take 100 mg by mouth daily. Patient not taking: Reported on 01/17/2024 01/22/22   [provider]    Allergies: Patient has no known allergies.    Review of Systems  Updated Vital Signs  Vitals:   02/23/24 0741 02/23/24 1131  BP: (!) 149/96 (!) 130/95  Pulse: 73 85  Resp: 18 18  Temp: 98.3 F (36.8 C) 99.3 F (37.4 C)  TempSrc: Oral Oral  SpO2: 95% 95%     Physical Exam Vitals and nursing note reviewed.  HENT:     Head: Normocephalic.  Eyes:     Extraocular Movements: Extraocular movements intact.     Pupils: Pupils are equal, round, and reactive to light.  Cardiovascular:     Rate and Rhythm: Normal rate and regular rhythm.     Heart sounds: Normal heart sounds.  Pulmonary:     Effort: Pulmonary effort is normal. No respiratory distress.     Breath sounds: Rhonchi present.     Comments: Coarse breath sounds and rhonchi in upper lung fields Speaking in full/clear sentences, no tripoding/accessory muscle use Musculoskeletal:     Cervical back: Normal  range of motion.     Comments: Moves all extremities spontaneously without difficulty  Skin:    General: Skin is warm and dry.  Neurological:     General: No focal deficit present.     Mental Status: She is alert and oriented to person, place, and time.     (all labs ordered are listed, but only abnormal results are displayed) Labs Reviewed  RESP PANEL BY RT-PCR (RSV, FLU A&B, COVID)  [id number]    EKG: None  Radiology: DG Chest 2 View Result Date: 02/23/2024 CLINICAL DATA:  Oral rectal cough and history of COPD.  EXAM: CHEST - 2 VIEW COMPARISON:  06/20/2020 FINDINGS: Heart size and mediastinal contours are within normal limits. Stable chronic lung disease with diffuse interstitial prominence bilaterally. No significant hyperinflation. There is no evidence of pulmonary edema, consolidation, pneumothorax, nodule or pleural fluid. Visualized bony structures are within normal limits. IMPRESSION: Stable chronic lung disease. No acute findings. Electronically Signed   By: Marcey Moan M.D.   On: 02/23/2024 10:36     Procedures   Medications Ordered in the ED  ipratropium-albuterol  (DUONEB) 0.5-2.5 (3) MG/3ML nebulizer solution 3 mL (3 mLs Nebulization Patient Refused/Not Given 02/23/24 1110)  predniSONE  (DELTASONE ) tablet 60 mg (60 mg Oral Given 02/23/24 1110)                                    Medical Decision Making This patient presents to the ED for concern of flu like symptoms, this involves an extensive number of treatment options, and is a complaint that carries with it a high risk of complications and morbidity.  The differential diagnosis includes COVID/flu/RSV, COPD exacerbation, pneumonia, other viral illness   Co morbidities that complicate the patient evaluation  COPD   Additional history obtained:  Additional history obtained from record review External records from outside source obtained and reviewed including prior pulmonology / PCP notes   Lab Tests:  I Ordered, and personally interpreted labs.  The pertinent results include:  COVID/Flu/RSV negative    Imaging Studies ordered:  I ordered imaging studies including CXR  I independently visualized and interpreted imaging which showed Stable chronic lung disease. No acute findings.  I agree with the radiologist interpretation   Cardiac Monitoring: / EKG:  The patient was maintained on a cardiac monitor.  I personally viewed and interpreted the cardiac monitored which showed an underlying rhythm of: NSR   Problem List /  ED Course / Critical interventions / Medication management  I ordered medication including Duoneb and prednisone   for suspected COPD exacerbation; patient declined DuoNeb I have reviewed the patients home medicines and have made adjustments as needed   Social Determinants of Health:  Tobacco use, housing instability   Test / Admission - Considered:  Physical exam notable as above.  I offered patient basic labs today to assess her blood counts/electrolytes, however she declines as she does not wish to have blood work collected.  Vitals are reassuring, she is afebrile and not tachycardic, no hypoxia.  Chest x-ray is reassuring, however given that patient has diagnosis of COPD with history of multiple COPD exacerbations, will treat for suspected COPD exacerbation today.  Patient given 60 mg prednisone  in the emergency department today, however she declined DuoNeb treatment. Will discharge with 5-day course of prednisone  as well as Augmentin .  I recommend that she contact her pulmonologist to schedule follow-up in regard to her symptoms.  Return precautions discussed.  She is in agreement this plan and is appropriate for discharge at this time.    Amount and/or Complexity of Data Reviewed Radiology: ordered.  Risk Prescription drug management.        Final diagnoses:  COPD exacerbation [facility])    ED Discharge Orders          Ordered    amoxicillin -clavulanate (AUGMENTIN ) 875-125 MG tablet  Every 12 hours        02/23/24 1154    predniSONE  (DELTASONE ) 20 MG tablet  Daily        02/23/24 1154               Glendia Rocky SAILOR, [place] 02/23/24 1156    Jerrol Agent, MD 02/23/24 1408

## 2024-02-25 ENCOUNTER — Other Ambulatory Visit: Payer: Self-pay | Admitting: Family Medicine

## 2024-02-25 ENCOUNTER — Other Ambulatory Visit: Payer: Self-pay

## 2024-02-25 DIAGNOSIS — I10 Essential (primary) hypertension: Secondary | ICD-10-CM

## 2024-02-28 ENCOUNTER — Other Ambulatory Visit: Payer: Self-pay

## 2024-02-28 ENCOUNTER — Other Ambulatory Visit (HOSPITAL_COMMUNITY): Payer: Self-pay

## 2024-02-29 ENCOUNTER — Other Ambulatory Visit (HOSPITAL_COMMUNITY): Payer: Self-pay

## 2024-02-29 ENCOUNTER — Encounter: Payer: Self-pay | Admitting: Pharmacist

## 2024-02-29 ENCOUNTER — Other Ambulatory Visit: Payer: Self-pay

## 2024-02-29 ENCOUNTER — Other Ambulatory Visit: Payer: Self-pay | Admitting: Family Medicine

## 2024-02-29 DIAGNOSIS — I10 Essential (primary) hypertension: Secondary | ICD-10-CM

## 2024-02-29 MED ORDER — AMLODIPINE BESYLATE 10 MG PO TABS
10.0000 mg | ORAL_TABLET | Freq: Every day | ORAL | 0 refills | Status: DC
  Filled 2024-02-29: qty 30, 30d supply, fill #0
  Filled 2024-04-10 – 2024-04-11 (×2): qty 30, 30d supply, fill #1

## 2024-03-01 ENCOUNTER — Other Ambulatory Visit (HOSPITAL_COMMUNITY): Payer: Self-pay

## 2024-03-07 ENCOUNTER — Other Ambulatory Visit: Payer: Self-pay

## 2024-03-16 ENCOUNTER — Ambulatory Visit: Payer: Self-pay

## 2024-03-16 NOTE — Telephone Encounter (Signed)
 Has appointment with PCE tomorrow.

## 2024-03-16 NOTE — Telephone Encounter (Signed)
 FYI Only or Action Required?: FYI only for provider.  Patient was last seen in primary care on 12/27/2023 by Theotis Haze ORN, NP.  Called Nurse Triage reporting Dysuria.  Symptoms began several days ago.  Interventions attempted: Nothing.  Symptoms are: stable.  Triage Disposition: See Physician Within 24 Hours  Patient/caregiver understands and will follow disposition?: Yes Reason for Disposition  Age > 50 years  Answer Assessment - Initial Assessment Questions 1. SEVERITY: How bad is the pain?  (e.g., Scale 1-10; mild, moderate, or severe)     7/10  3. PATTERN: Is pain present every time you urinate or just sometimes?      Every time  4. ONSET: When did the painful urination start?      4 days ago  5. FEVER: Do you have a fever? If Yes, ask: What is your temperature, how was it measured, and when did it start?     Denies  6. PAST UTI: Have you had a urine infection before? If Yes, ask: When was the last time? and What happened that time?      Denies  7. OTHER SYMPTOMS: Do you have any other symptoms? (e.g., blood in urine, flank pain, genital sores, urgency, vaginal discharge)     Urinary Frequency, abdominal pain after emptying bladder, fishy odor  Protocols used: Urination Pain - Female-A-AH  Copied from CRM [id number]. Topic: Clinical - Red Word Triage >> Mar 16, 2024  2:57 PM Tiffini S wrote: [facility] that prompted transfer to Nurse Triage: Patient is having a burning sensation during urination/ odor- offered appointment for 10/24 at [facility] but patient refused.

## 2024-03-17 ENCOUNTER — Ambulatory Visit: Payer: Self-pay

## 2024-03-17 VITALS — BP 144/94 | HR 77 | Temp 98.1°F | Resp 16 | Wt 155.8 lb

## 2024-03-17 DIAGNOSIS — R300 Dysuria: Secondary | ICD-10-CM | POA: Diagnosis not present

## 2024-03-17 DIAGNOSIS — N3001 Acute cystitis with hematuria: Secondary | ICD-10-CM | POA: Diagnosis not present

## 2024-03-17 LAB — POCT URINALYSIS DIP (CLINITEK)
Glucose, UA: NEGATIVE mg/dL
Ketones, POC UA: NEGATIVE mg/dL
Nitrite, UA: POSITIVE — AB
POC PROTEIN,UA: 100 — AB
Spec Grav, UA: >=1.03 — AB (ref 1.010–1.025)
Urobilinogen, UA: 0.2 U/dL
pH, UA: 5.5 (ref 5.0–8.0)

## 2024-03-17 MED ORDER — SULFAMETHOXAZOLE-TRIMETHOPRIM 800-160 MG PO TABS: 1.0000 | ORAL_TABLET | Freq: Two times a day (BID) | ORAL | 0 refills | Status: AC

## 2024-03-17 NOTE — Progress Notes (Signed)
     Patient ID: Marissa Owens, female    DOB: 03/31/1961  MRN: [id number]  CC: Dysuria (Patient c/o burning and bladder pain 7/10)   Subjective: Brae Schaafsma is a 63 y.o. female with who presents to clinic with 1 week history of painful urination.  Associated symptoms include pelvic pain, fishy odor and frequent urination.  Patient denies being sexually active.  Denies fever, confusion or altered mental status.   No Known Allergies  ROS: Review of Systems Negative except as stated above  PHYSICAL EXAM: BP (!) 144/94   Pulse 77   Temp 98.1 F (36.7 C) (Oral)   Resp 16   Wt 155 lb 12.8 oz (70.7 kg)   SpO2 94%   BMI 28.50 kg/m   Physical Exam  General: well-appearing, no acute distress Skin: no jaundice, rashes, or lesions Cardiovascular: regular heart rate and rhythm, normal S1/S2 Chest: no skeletal deformity, lungs clear to auscultation bilaterally, equal breath sounds bilaterally Abdomen: soft, non-distended, tenderness suprapubic area Musculoskeletal: normal gait Extremities: no peripheral edema  ASSESSMENT AND PLAN:  1. Dysuria (Primary) - POCT URINALYSIS DIP shows precense of nitrites, leukocytes, hgb and protein which is suggestive of acute cystitis when also considering symptoms.   2. Acute cystitis with hematuria - sulfamethoxazole-trimethoprim (BACTRIM DS) 800-160 MG tablet; Take 1 tablet by mouth 2 (two) times daily for 3 days.  Dispense: 6 tablet; Refill: 0    Patient was given the opportunity to ask questions.  Patient verbalized understanding of the plan and was able to repeat key elements of the plan.    Orders Placed This Encounter  Procedures   POCT URINALYSIS DIP (CLINITEK)     Requested Prescriptions   Signed Prescriptions Disp Refills   sulfamethoxazole-trimethoprim (BACTRIM DS) 800-160 MG tablet 6 tablet 0    Sig: Take 1 tablet by mouth 2 (two) times daily for 3 days.    No follow-ups on file.  Sula Leavy Rode, PA-C

## 2024-03-17 NOTE — Addendum Note (Signed)
 Addended by: CELESTIA GUSTAV BRAVO on: 03/17/2024 04:18 PM   Modules accepted: Orders

## 2024-03-22 LAB — URINE CULTURE

## 2024-03-23 ENCOUNTER — Ambulatory Visit: Payer: Self-pay

## 2024-04-11 ENCOUNTER — Other Ambulatory Visit (HOSPITAL_COMMUNITY): Payer: Self-pay

## 2024-04-17 ENCOUNTER — Telehealth: Payer: Self-pay | Admitting: Internal Medicine

## 2024-04-17 NOTE — Telephone Encounter (Signed)
 PT handed in paper work for Dr. Meade. Was placed in mailbox.

## 2024-04-24 ENCOUNTER — Ambulatory Visit: Payer: Self-pay | Admitting: Pulmonary Disease

## 2024-04-24 NOTE — Telephone Encounter (Signed)
She needs to go to the ED

## 2024-04-24 NOTE — Telephone Encounter (Signed)
 FYI

## 2024-04-24 NOTE — Telephone Encounter (Signed)
 Copied from CRM #[id number]. Topic: Clinical - Red Word Triage >> Apr 24, 2024  1:44 PM Marissa Owens wrote: [facility] that prompted transfer to Nurse Triage: experiencing SOB walking short distances  Phone call placed to patient for triage. Call went directly to Voicemail. Left Owens message asking patient to call back to Nurse Triage. Will place in call backs.

## 2024-04-24 NOTE — Telephone Encounter (Signed)
  E2C2 Pulmonary Triage - Initial Assessment Questions "Chief Complaint (e.g., cough, sob, wheezing, fever, chills, sweat or additional symptoms) *Go to specific symptom protocol after initial questions. Sob with exertion, wheezing  "How long have symptoms been present?" 3 weeks   "Have you used your inhalers/maintenance medication?" Yes If yes, "What medications?" BREZTRI  AEROSPHERE 2 puffs x 4 times daily; more than prescribed  Albuterol  inhaler 2 to 3 puffs  Neb tx;  All meds wear off once with exertion    "Do you monitor your oxygen levels?" yes If yes, What is your reading (oxygen level) today? 94% RA, at rest    Reason for Disposition  [1] MILD difficulty breathing (e.g., minimal/no SOB at rest, SOB with walking, pulse < 100) AND [2] NEW-onset or WORSE than normal  Answer Assessment - Initial Assessment Questions No available appts with pulmonary or pcp today.  Advised ED now. Patient declines and reports I do not feel like going anywhere.I am going to do my breathing treatment and lay down. If I need to go to ED, I'll go in the morning.   Nurse offered to call 911 for patient, patient declined.   Patient requests excuse from work.  1. RESPIRATORY STATUS: Describe your breathing? (e.g., wheezing, shortness of breath, unable to speak, severe coughing)   BREZTRI  AEROSPHERE inhaler suppose 2 x day, taking 4x daily; albuterol  2 to 3 puffs, wheezing, sob exertion, albuterol  rescue inhlaer, but once moving around again, wears off. 2. ONSET: When did this breathing problem begin?      Month ago 3. PATTERN Does the difficult breathing come and go, or has it been constant since it started?      Comes and goes with exertion 4. SEVERITY: How bad is your breathing? (e.g., mild, moderate, severe)      moderate 5. RECURRENT SYMPTOM: Have you had difficulty breathing before? If Yes, ask: When was the last time? and What happened that time?      yes 6. CARDIAC  HISTORY: Do you have any history of heart disease? (e.g., heart attack, angina, bypass surgery, angioplasty)      no 7. LUNG HISTORY: Do you have any history of lung disease?  (e.g., pulmonary embolus, asthma, emphysema)     copd 8. CAUSE: What do you think is causing the breathing problem?      unsure 9. OTHER SYMPTOMS: Do you have any other symptoms? (e.g., chest pain, cough, dizziness, fever, runny nose)     Cough; dry, feels hot when moving around Denies fever, chills, n/v, chest pain, runny nose, dizziness 10. O2 SATURATION MONITOR:  Do you use an oxygen saturation monitor (pulse oximeter) at home? If Yes, ask: What is your reading (oxygen level) today? What is your usual oxygen saturation reading? (e.g., 95%)       90-88% Currently 94% RA; reports does not use oxygen Neb tx, albuterol  inhaler,   12. TRAVEL: Have you traveled out of the country in the last month? (e.g., travel history, exposures)       No Current smoker, trying to cease  Protocols used: Breathing Difficulty-A-AH

## 2024-04-25 ENCOUNTER — Ambulatory Visit: Admitting: Adult Health

## 2024-04-25 ENCOUNTER — Encounter: Payer: Self-pay | Admitting: Adult Health

## 2024-04-25 VITALS — BP 130/84 | HR 97 | Temp 98.0°F | Ht 62.0 in | Wt 158.0 lb

## 2024-04-25 DIAGNOSIS — F17210 Nicotine dependence, cigarettes, uncomplicated: Secondary | ICD-10-CM

## 2024-04-25 DIAGNOSIS — F32A Depression, unspecified: Secondary | ICD-10-CM | POA: Diagnosis not present

## 2024-04-25 DIAGNOSIS — J441 Chronic obstructive pulmonary disease with (acute) exacerbation: Secondary | ICD-10-CM

## 2024-04-25 MED ORDER — ALBUTEROL SULFATE (2.5 MG/3ML) 0.083% IN NEBU: 2.5000 mg | INHALATION_SOLUTION | Freq: Four times a day (QID) | RESPIRATORY_TRACT | 5 refills | Status: AC | PRN

## 2024-04-25 MED ORDER — DOXYCYCLINE HYCLATE 100 MG PO TABS: 100.0000 mg | ORAL_TABLET | Freq: Two times a day (BID) | ORAL | 0 refills | Status: DC

## 2024-04-25 MED ORDER — PREDNISONE 10 MG PO TABS: ORAL_TABLET | ORAL | 0 refills | Status: DC

## 2024-04-25 NOTE — Progress Notes (Signed)
 @Patient  ID: Marissa Owens, female    DOB: Jan 27, 1961, 63 y.o.   MRN: [id number]  Chief Complaint  Patient presents with   Acute Visit    SOB and cough    Referring provider: Delbert Clam, MD  HPI: 63 year old female active smoker followed for COPD with emphysema Participates in the lung cancer CT chest screening program Works in the medical field as a CNA    TEST/EVENTS : Reviewed 04/25/2024  CT chest May 11, 2023 showed moderate emphysema, scattered lung nodules maximum 5.9 mm, lung RADS 2 Discussed the use of AI scribe software for clinical note transcription with the patient, who gave verbal consent to proceed.  History of Present Illness Marissa Owens is a 63 year old female with COPD and emphysema who presents with increased cough and congestion for last 3 weeks.   She has experienced increased cough and congestion for the past three weeks, which she attributes to a COPD flare-up. The cough is productive with thick white phlegm and is accompanied by significant wheezing. No hemoptysis or vomiting is present.  She has a history of COPD and emphysema and continues to smoke about half a pack of cigarettes per day. Her current medications include Breztri  twice daily, an albuterol  inhaler as needed, and a nebulizer solution, which she has increased to three or four times daily due to her symptoms.  She has had two emergency room visits this year, one in October where she received antibiotics and steroids, and another in February receiving antibiotics and steroids. . chest x-ray from the October visit showed no pneumonia stable COPD changes.. She has a breathing test scheduled.  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 She is worried about the implications of her condition on her job security.  She is managing mood swings and chronic depression, for which she takes Depakote   and sertraline. She previously tried Wellbutrin  but felt it worsened her depression. No suicidal thoughts are present.  We discussed in detail smoking cessation with helpful hints.   No Known Allergies  Immunization History  Administered Date(s) Administered   Influenza,inj,Quad PF,6+ Mos 04/14/2018, 04/24/2019, 04/24/2020, 02/06/2022   PFIZER(Purple Top)SARS-COV-2 Vaccination 09/16/2019, 10/05/2019   PPD Test 10/05/2022, 11/10/2023   Tdap 02/06/2022    Past Medical History:  Diagnosis Date   COPD (chronic obstructive pulmonary disease) (HCC)    Diabetes mellitus without complication (HCC)    Hypertension    Renal disorder    kidney stone    Tobacco History: Social History   Tobacco Use  Smoking Status Every Day   Current packs/day: 1.00   Types: Cigarettes  Smokeless Tobacco Never  Tobacco Comments   Started smoking at age 73.  Smokes10 cigarettes a day   Ready to quit: Not Answered Counseling given: Not Answered Tobacco comments: Started smoking at age 8.  Smokes10 cigarettes a day   Outpatient Medications Prior to Visit  Medication Sig Dispense Refill   albuterol  (PROVENTIL ) (2.5 MG/3ML) 0.083% nebulizer solution Take 3 mLs (2.5 mg total) by nebulization every 6 (six) hours as needed for wheezing or shortness of breath. 75 mL 12   albuterol  (VENTOLIN  HFA) 108 (90 Base) MCG/ACT inhaler Inhale 2 puffs into the lungs every 6 (six) hours as needed for wheezing or shortness of breath. 26.8 g 2   amLODipine  (NORVASC ) 10 MG tablet Take 1 tablet (10 mg total) by mouth daily. 90 tablet  0   budesonide-glycopyrrolate-formoterol (BREZTRI  AEROSPHERE) 160-9-4.8 MCG/ACT AERO inhaler Inhale 2 puffs into the lungs in the morning and at bedtime.     divalproex  (DEPAKOTE  ER) 500 MG 24 hr tablet Take 1,000 mg by mouth at bedtime.     hydrocortisone  2.5 % cream Apply topically 2 (two) times daily. 30 g 1   mirabegron  ER (MYRBETRIQ ) 25 MG TB24 tablet Take 1 tablet (25 mg total) by mouth  daily. 30 tablet 0   Respiratory Therapy Supplies (ADULT MASK) MISC Needs an mask for her nebulizer machine 1 each 0   sertraline (ZOLOFT) 100 MG tablet Take 100 mg by mouth daily.     atorvastatin  (LIPITOR) 10 MG tablet Take 1 tablet (10 mg total) by mouth daily. (Patient not taking: Reported on 04/25/2024) 90 tablet 1   buPROPion  (WELLBUTRIN  SR) 150 MG 12 hr tablet Take 1 tablet (150 mg total) by mouth 2 (two) times daily. (Patient not taking: Reported on 04/25/2024) 60 tablet 5   No facility-administered medications prior to visit.     Review of Systems:   Constitutional:   No  weight loss, night sweats,  Fevers, chills, +fatigue, or  lassitude.  HEENT:   No headaches,  Difficulty swallowing,  Tooth/dental problems, or  Sore throat,                No sneezing, itching, ear ache, nasal congestion, post nasal drip,   CV:  No chest pain,  Orthopnea, PND, swelling in lower extremities, anasarca, dizziness, palpitations, syncope.   GI  No heartburn, indigestion, abdominal pain, nausea, vomiting, diarrhea, change in bowel habits, loss of appetite, bloody stools.   Resp:   No chest wall deformity  Skin: no rash or lesions.  GU: no dysuria, change in color of urine, no urgency or frequency.  No flank pain, no hematuria   MS:  No joint pain or swelling.  No decreased range of motion.  No back pain.    Physical Exam  BP 130/84   Pulse 97   Temp 98 F (36.7 C)   Ht 5' 2 (1.575 m) Comment: Per pt  Wt 158 lb (71.7 kg)   SpO2 96% Comment: RA  BMI 28.90 kg/m   GEN: A/Ox3; pleasant , NAD, well nourished    HEENT:  [place]/AT,   , NOSE-clear, THROAT-clear, no lesions, no postnasal drip or exudate noted.   NECK:  Supple w/ fair ROM; no JVD; normal carotid impulses w/o bruits; no thyromegaly or nodules palpated; no lymphadenopathy.    RESP scattered rhonchi  no accessory muscle use, no dullness to percussion  CARD:  RRR, no m/r/g, no peripheral edema, pulses intact, no cyanosis or  clubbing.  GI:   Soft & nt; nml bowel sounds; no organomegaly or masses detected.   Musco: Warm bil, no deformities or joint swelling noted.   Neuro: alert, no focal deficits noted.    Skin: Warm, no lesions or rashes    Lab Results:Reviewed 04/25/2024   CBC   BMET    Imaging: No results found.  Administration History     None           No data to display          No results found for: NITRICOXIDE      No data to display              Assessment & Plan:   Assessment and Plan Assessment & Plan COPD with acute exacerbation with acute bronchitis  acute exacerbation of COPD with increased cough, congestion, and wheezing.  She is currently on Breztri  twice daily and uses an albuterol  inhaler as needed, with no home oxygen use. . Prescribe doxycycline  for one week and prednisone  taper over the next week, both to be taken with food. Continue Breztri , two puffs twice daily, and use the albuterol  inhaler as needed. Recommend Robitussin for coughing and congestion. Keep follow-up appointment in two weeks with PFT. Smoking cessation discussed.   Tobacco use disorder   She continues to smoke about half a pack per day and is interested in quitting. She has previous concerns about side effects of smoking cessation medications. Discussed a gradual reduction strategy and the use of nicotine  patches. Encourage setting a quit date in mid-January. Provide information on 1-800-QUIT-NOW for smoking cessation support. Encourage gradual reduction of smoking, starting with reducing to nine cigarettes per day next week. Recommend using nicotine  patches once smoking is reduced to five cigarettes per day. Suggest using stress balls, sugarless candy, and straws to manage cravings.  Bipolar disorder and depression   Her condition is managed with Depakote  and sertraline. Wellbutrin  was previously discontinued due to worsening depression. She has no current suicidal thoughts.  Consult  with a psychiatrist regarding medication management. Would avoid Chantix for smoking cessation.    Plan  Patient Instructions  Work note for today-out of work 12/1-12/2, return 04/26/24 Doxycycline  100mg  Twice daily  for 1 week, take with food.  Prednisone  taper over next week, take with food Robitussin DM As needed  cough/congestion  Breztri  2 puffs Twice daily, rinse after use  Albuterol  inhaler /neb As needed   Work on quitting smoking -[id number]  Follow up in 2 weeks as planned and As needed   Please contact office for sooner follow up if symptoms do not improve or worsen or seek emergency care         Oron Westrup, NP 04/25/2024  I spent 42   minutes dedicated to the care of this patient on the date of this encounter to include pre-visit review of records, face-to-face time with the patient discussing conditions above, post visit ordering of testing, clinical documentation with the electronic health record, making appropriate referrals as documented, and communicating necessary findings to members of the patients care team.

## 2024-04-25 NOTE — Telephone Encounter (Signed)
 I called and spoke with patient, she states she did not go to the ED yesterday.  She called this morning and has an appointment with Madelin Stank NP at 10 am.  She said she is better this morning, she was having a bad day yesterday.  I advised her to arrive by 9:45 am for check in and we will get her evaluated.  She verbalized understanding.  Nothing further needed.  Sending a message to PCP as an FYI.  Patient was not seen in the ED on 12/1 as advised.  She has an appointment with our office this morning.  I just wanted to keep you in the loop.  Thank you.

## 2024-04-25 NOTE — Telephone Encounter (Signed)
 A user error has taken place: encounter opened in error, closed for administrative reasons.

## 2024-04-25 NOTE — Patient Instructions (Signed)
 Work note for today-out of work 12/1-12/2, return 04/26/24 Doxycycline  100mg  Twice daily  for 1 week, take with food.  Prednisone  taper over next week, take with food Robitussin DM As needed  cough/congestion  Breztri  2 puffs Twice daily, rinse after use  Albuterol  inhaler /neb As needed   Work on quitting smoking -1800QUITNOW  Follow up in 2 weeks as planned and As needed   Please contact office for sooner follow up if symptoms do not improve or worsen or seek emergency care

## 2024-05-01 ENCOUNTER — Telehealth: Payer: Self-pay | Admitting: Internal Medicine

## 2024-05-01 NOTE — Telephone Encounter (Signed)
 Copied from CRM [id number]. Topic: General - Other >> May 01, 2024 11:32 AM Essie A wrote: Reason for CRM: Patient was in the office on 04/25/24 and left some FMLA papers to be completed at the front desk.  She is calling to find out if they have been completed so she can pick them up.  Please return her call at [phone].     We have no documentation of this. Did the patient happen to give it to provider during her appointment?

## 2024-05-02 ENCOUNTER — Telehealth: Payer: Self-pay

## 2024-05-02 ENCOUNTER — Telehealth: Payer: Self-pay | Admitting: *Deleted

## 2024-05-02 NOTE — Telephone Encounter (Signed)
 Follow-Up Note: FMLA paperwork  Patient presented at the front desk regarding FMLA paperwork.   Their last office visit was on 04/25/24. The deadline to submit the paperwork is 05/03/2024.   The patient's employer is [employer].   The patient will call back around 3pm today for an update on the paperwork status.

## 2024-05-02 NOTE — Telephone Encounter (Signed)
 I called and left a message for patient letting her know that we located the paperwork that she dropped off and are going to have the nurse practitioner that saw her in the office on 04/25/24 complete that paperwork for her and we will call her when it is complete.  I left our call back number for her to call.

## 2024-05-02 NOTE — Telephone Encounter (Signed)
 Copied from CRM [id number]. Topic: General - Other >> May 01, 2024 11:32 AM Essie A wrote: Reason for CRM: Patient was in the office on 04/25/24 and left some FMLA papers to be completed at the front desk.  She is calling to find out if they have been completed so she can pick them up.  Please return her call at [phone]. >> May 02, 2024 10:53 AM Rozanna MATSU wrote: The pt called to check on paperwork [facility]) called CAL no record of paperwork. Stated she will drop them off and would like to get the papers she dropped off before. Thanks     This has already been addressed

## 2024-05-02 NOTE — Telephone Encounter (Signed)
 Copied from CRM [id number]. Topic: General - Other >> May 02, 2024  2:44 PM Rilla B wrote: Reason for CRM: Patient returning call to [facility].  Please call patient @ [phone].  Pt states that sometimes she misses work due to her SOB from her COPD, and that this paperwork would keep her from losing her job on the days that she is not able to work.  A copy was made of the FMLA paperwork and placed into the black file folder in B Pod. The original was placed on Tammy Parrett's sign folder in B Pod.

## 2024-05-02 NOTE — Telephone Encounter (Signed)
 ATC,LVMTCB - asking pt what exactly the forms are for? Tammy is unsure if this is for medical leave or something else.

## 2024-05-02 NOTE — Telephone Encounter (Signed)
 Paperwork located and placed on Marissa Owens's desk for her to complete since she saw patient on 04/25/2024.

## 2024-05-03 NOTE — Telephone Encounter (Signed)
°  Copied from CRM [id number]. Topic: General - Other >> May 02, 2024  2:44 PM Rilla B wrote: Reason for CRM: Patient returning call to [facility].  Please call patient @ [phone].   Pt states that sometimes she misses work due to her SOB from her COPD, and that this paperwork would keep her from losing her job on the days that she is not able to work.   A copy was made of the FMLA paperwork and placed into the black file folder in B Pod. The original was placed on Tammy Parrett's sign folder in B Pod.

## 2024-05-04 ENCOUNTER — Telehealth: Payer: Self-pay | Admitting: *Deleted

## 2024-05-04 NOTE — Telephone Encounter (Signed)
 Copied from CRM [id number]. Topic: General - Other >> May 02, 2024  2:44 PM Rilla B wrote: Reason for CRM: Patient returning call to [facility].  Please call patient @ [phone]. >> May 03, 2024  3:35 PM Rozanna MATSU wrote: Pt calling back about FMLA papers, stated today was the deadline for her to get them back to her employer, stated she dropped the papers off at the beginning of the month.   -------------------------------------------------------------------------------------------------------------------------------------------  FMLA paperwork completed/signed by Madelin Stank NP.  Copy was made and sent to scan.  Original was handed to patient, she came to the office to pick up the forms in person.  Nothing further needed.

## 2024-05-04 NOTE — Telephone Encounter (Signed)
 FMLA paperwork completed/signed.  Paperwork handed directly to patient.  Copy made and sent to scan.  Nothing furthr needed.

## 2024-05-04 NOTE — Telephone Encounter (Signed)
 Patient is aware will wait for out of work note on the 17th.NFN

## 2024-05-04 NOTE — Telephone Encounter (Signed)
 Has ov next week, keep that ov  If she needs note to be out of work , can send and Dr. Meade can address on return  If she is worse, sooner ov or ER  Please contact office for sooner follow up if symptoms do not improve or worsen or seek emergency care

## 2024-05-04 NOTE — Telephone Encounter (Signed)
 Spoke with patient when she came to pick up FMLA paperwork.  She states she is still not feeling well from the flare up of her COPD earlier this month when she saw Tammy on 12/2.  She is still has SOB and has to sit down frequently at work.  She is extremely tired and has to push to make it through work.  She is still coughing, but no mucous comes up.  She takes her rescue inhaler to work and uses it in the bathroom d/t coworkers thinking she is doing something she is not supposed to do.  She also takes her nebulizer to work in case she needs a treatment at work as well.  Coworkers are asking her why she is sitting down and what she is doing when she is using her inhaler or nebulizer medication.  She says she does not feel like she has gotten over the COPD exacerbation.  She feels like she needs to be out of work longer to recover from the flare up.  Tammy, Please advise regarding continued SOB, coughing and fatigue since COPD execration and more time off to recover.  Thank you.

## 2024-05-05 NOTE — Telephone Encounter (Signed)
 Copied from CRM [id number]. Topic: General - Other >> May 02, 2024  2:44 PM Rilla B wrote: Reason for CRM: Patient returning call to [facility].  Please call patient @ [phone]. >> May 04, 2024  2:10 PM Rozanna G wrote: Pt called back to see if paperwork she dropped off was completed, advised her per CAL the paperwork is completed and ready to be picked up before 5pm today. Thanks  >> May 03, 2024  3:35 PM Rozanna MATSU wrote: Pt calling back about FMLA papers, stated today was the deadline for her to get them back to her employer, stated she dropped the papers off at the beginning of the month.   FMLA paperwork has already been signed and picked up by pt. NFN

## 2024-05-09 ENCOUNTER — Other Ambulatory Visit: Payer: Self-pay | Admitting: *Deleted

## 2024-05-09 DIAGNOSIS — J441 Chronic obstructive pulmonary disease with (acute) exacerbation: Secondary | ICD-10-CM

## 2024-05-09 DIAGNOSIS — F17210 Nicotine dependence, cigarettes, uncomplicated: Secondary | ICD-10-CM

## 2024-05-10 ENCOUNTER — Encounter

## 2024-05-10 ENCOUNTER — Ambulatory Visit: Admitting: Internal Medicine

## 2024-05-15 ENCOUNTER — Ambulatory Visit: Attending: Family Medicine | Admitting: Family Medicine

## 2024-05-15 ENCOUNTER — Encounter: Payer: Self-pay | Admitting: Family Medicine

## 2024-05-15 VITALS — BP 133/85 | HR 78 | Temp 98.3°F | Ht 62.0 in | Wt 160.6 lb

## 2024-05-15 DIAGNOSIS — M19011 Primary osteoarthritis, right shoulder: Secondary | ICD-10-CM | POA: Diagnosis not present

## 2024-05-15 DIAGNOSIS — M19012 Primary osteoarthritis, left shoulder: Secondary | ICD-10-CM

## 2024-05-15 DIAGNOSIS — J449 Chronic obstructive pulmonary disease, unspecified: Secondary | ICD-10-CM

## 2024-05-15 DIAGNOSIS — Z23 Encounter for immunization: Secondary | ICD-10-CM | POA: Diagnosis not present

## 2024-05-15 DIAGNOSIS — G629 Polyneuropathy, unspecified: Secondary | ICD-10-CM | POA: Diagnosis not present

## 2024-05-15 DIAGNOSIS — E785 Hyperlipidemia, unspecified: Secondary | ICD-10-CM

## 2024-05-15 DIAGNOSIS — R202 Paresthesia of skin: Secondary | ICD-10-CM

## 2024-05-15 DIAGNOSIS — R7303 Prediabetes: Secondary | ICD-10-CM

## 2024-05-15 DIAGNOSIS — I10 Essential (primary) hypertension: Secondary | ICD-10-CM | POA: Diagnosis not present

## 2024-05-15 LAB — POCT GLYCOSYLATED HEMOGLOBIN (HGB A1C): HbA1c, POC (controlled diabetic range): 6.2 % (ref 0.0–7.0)

## 2024-05-15 MED ORDER — GABAPENTIN 300 MG PO CAPS: 300.0000 mg | ORAL_CAPSULE | Freq: Every day | ORAL | 3 refills | Status: AC

## 2024-05-15 MED ORDER — ATORVASTATIN CALCIUM 10 MG PO TABS: 10.0000 mg | ORAL_TABLET | Freq: Every day | ORAL | 1 refills | Status: AC

## 2024-05-15 MED ORDER — MELOXICAM 7.5 MG PO TABS: 7.5000 mg | ORAL_TABLET | Freq: Every day | ORAL | 1 refills | Status: AC

## 2024-05-15 MED ORDER — AMLODIPINE BESYLATE 10 MG PO TABS: 10.0000 mg | ORAL_TABLET | Freq: Every day | ORAL | 1 refills | Status: AC

## 2024-05-15 NOTE — Progress Notes (Signed)
 "  Subjective:  Patient ID: Marissa Owens, female    DOB: 03/03/1961  Age: 63 y.o. MRN: [id number]  CC: Pain (Having pain in feet and legs/)     Discussed the use of AI scribe software for clinical note transcription with the patient, who gave verbal consent to proceed.  History of Present Illness Marissa Owens is a 63 year old female with a history of COPD, hypertension, nicotine  dependence, prediabetes who presents with tingling in her legs and feet.  She has had constant tingling in her legs and feet for the past two to three months, described as painful and sticking, which interferes with sleep. There is no tingling in her hands. She wonders if this is related to her prediabetes.  She has painful cracking in both shoulders from arthritis. Topical cream has not helped.   She takes amlodipine  for blood pressure and atorvastatin  for cholesterol. She smokes and is working on quitting. She stopped Wellbutrin  in the past due to side effect concerns.  She has COPD with shortness of breath when walking and often needs to sit down. She is adherent with her inhalers and is followed by a pulmonologist.    Past Medical History:  Diagnosis Date   COPD (chronic obstructive pulmonary disease) (HCC)    Diabetes mellitus without complication (HCC)    Hypertension    Renal disorder    kidney stone    Past Surgical History:  Procedure Laterality Date   TUBAL LIGATION      Family History  Problem Relation Age of Onset   Cancer Mother    Hypertension Mother    Lung cancer Brother     Social History   Socioeconomic History   Marital status: Single    Spouse name: Not on file   Number of children: Not on file   Years of education: Not on file   Highest education level: Not on file  Occupational History   Not on file  Tobacco Use   Smoking status: Every Day    Current packs/day: 1.00    Types: Cigarettes   Smokeless tobacco: Never   Tobacco comments:    Started smoking  at age 46.  Smokes10 cigarettes a day  Vaping Use   Vaping status: Never Used  Substance and Sexual Activity   Alcohol use: No   Drug use: No   Sexual activity: Not Currently  Other Topics Concern   Not on file  Social History Narrative   Not on file   Social Drivers of Health   Tobacco Use: High Risk (05/15/2024)   Patient History    Smoking Tobacco Use: Every Day    Smokeless Tobacco Use: Never    Passive Exposure: Not on file  Financial Resource Strain: Not on file  Food Insecurity: Food Insecurity Present (04/07/2023)   Hunger Vital Sign    Worried About Running Out of Food in the Last Year: Sometimes true    Ran Out of Food in the Last Year: Sometimes true  Transportation Needs: No Transportation Needs (04/07/2023)   PRAPARE - Administrator, Civil Service (Medical): No    Lack of Transportation (Non-Medical): No  Physical Activity: Not on file  Stress: Stress Concern Present (04/07/2023)   [employer] of Occupational Health - Occupational Stress Questionnaire    Feeling of Stress : Very much  Social Connections: Socially Isolated (04/07/2023)   Social Connection and Isolation Panel    Frequency of Communication with Friends and Family: Once  a week    Frequency of Social Gatherings with Friends and Family: Never    Attends Religious Services: More than 4 times per year    Active Member of Clubs or Organizations: No    Attends Banker Meetings: Never    Marital Status: Never married  Depression (PHQ2-9): Low Risk (12/27/2023)   Depression (PHQ2-9)    PHQ-2 Score: 2  Alcohol Screen: Low Risk (04/07/2023)   Alcohol Screen    Last Alcohol Screening Score (AUDIT): 0  Housing: High Risk (04/07/2023)   Housing    Last Housing Risk Score: 2  Utilities: Not At Risk (04/07/2023)   AHC Utilities    Threatened with loss of utilities: No  Health Literacy: Adequate Health Literacy (04/07/2023)   B1300 Health Literacy    Frequency of need for  help with medical instructions: Never    Allergies[1]  Outpatient Medications Prior to Visit  Medication Sig Dispense Refill   albuterol  (PROVENTIL ) (2.5 MG/3ML) 0.083% nebulizer solution Take 3 mLs (2.5 mg total) by nebulization every 6 (six) hours as needed for wheezing or shortness of breath. 270 mL 5   albuterol  (VENTOLIN  HFA) 108 (90 Base) MCG/ACT inhaler Inhale 2 puffs into the lungs every 6 (six) hours as needed for wheezing or shortness of breath. 26.8 g 2   budesonide-glycopyrrolate-formoterol (BREZTRI  AEROSPHERE) 160-9-4.8 MCG/ACT AERO inhaler Inhale 2 puffs into the lungs in the morning and at bedtime.     divalproex  (DEPAKOTE  ER) 500 MG 24 hr tablet Take 1,000 mg by mouth at bedtime.     mirabegron  ER (MYRBETRIQ ) 25 MG TB24 tablet Take 1 tablet (25 mg total) by mouth daily. 30 tablet 0   Respiratory Therapy Supplies (ADULT MASK) MISC Needs an mask for her nebulizer machine 1 each 0   sertraline (ZOLOFT) 100 MG tablet Take 100 mg by mouth daily.     amLODipine  (NORVASC ) 10 MG tablet Take 1 tablet (10 mg total) by mouth daily. 90 tablet 0   buPROPion  (WELLBUTRIN  SR) 150 MG 12 hr tablet Take 1 tablet (150 mg total) by mouth 2 (two) times daily. (Patient not taking: Reported on 05/15/2024) 60 tablet 5   doxycycline  (VIBRA -TABS) 100 MG tablet Take 1 tablet (100 mg total) by mouth 2 (two) times daily. (Patient not taking: Reported on 05/15/2024) 14 tablet 0   hydrocortisone  2.5 % cream Apply topically 2 (two) times daily. (Patient not taking: Reported on 05/15/2024) 30 g 1   predniSONE  (DELTASONE ) 10 MG tablet 4 tabs for 2 days, then 3 tabs for 2 days, 2 tabs for 2 days, then 1 tab for 2 days, then stop (Patient not taking: Reported on 05/15/2024) 20 tablet 0   atorvastatin  (LIPITOR) 10 MG tablet Take 1 tablet (10 mg total) by mouth daily. (Patient not taking: Reported on 05/15/2024) 90 tablet 1   No facility-administered medications prior to visit.     ROS Review of Systems   Constitutional:  Negative for activity change and appetite change.  HENT:  Negative for sinus pressure and sore throat.   Respiratory:  Positive for shortness of breath. Negative for chest tightness and wheezing.   Cardiovascular:  Negative for chest pain and palpitations.  Gastrointestinal:  Negative for abdominal distention, abdominal pain and constipation.  Genitourinary: Negative.   Musculoskeletal:        See HPI  Neurological:  Positive for numbness.  Psychiatric/Behavioral:  Negative for behavioral problems and dysphoric mood.     Objective:  BP 133/85   Pulse  78   Temp 98.3 F (36.8 C) (Oral)   Ht 5' 2 (1.575 m)   Wt 160 lb 9.6 oz (72.8 kg)   SpO2 94%   BMI 29.37 kg/m      05/15/2024   11:08 AM 04/25/2024    9:55 AM 03/17/2024    1:20 PM  BP/Weight  Systolic BP 133 130 144  Diastolic BP 85 84 94  Wt. (Lbs) 160.6 158 155.8  BMI 29.37 kg/m2 28.9 kg/m2 28.5 kg/m2      Physical Exam Constitutional:      Appearance: She is well-developed.  Cardiovascular:     Rate and Rhythm: Normal rate.     Heart sounds: Normal heart sounds. No murmur heard. Pulmonary:     Effort: Pulmonary effort is normal.     Breath sounds: Normal breath sounds. No wheezing or rales.  Chest:     Chest wall: No tenderness.  Abdominal:     General: Bowel sounds are normal. There is no distension.     Palpations: Abdomen is soft. There is no mass.     Tenderness: There is no abdominal tenderness.  Musculoskeletal:     Right lower leg: No edema.     Left lower leg: No edema.     Comments: Tenderness on internal rotation of both upper extremities Normal handgrip bilaterally  Neurological:     Mental Status: She is alert and oriented to person, place, and time.  Psychiatric:        Mood and Affect: Mood normal.        Latest Ref Rng & Units 11/18/2022    3:42 AM 02/06/2022    4:28 PM 10/16/2020   11:21 AM  CMP  Glucose 70 - 99 mg/dL 899  90  80   BUN 8 - 23 mg/dL 9  10  11     Creatinine 0.44 - 1.00 mg/dL 9.28  9.17  9.26   Sodium 135 - 145 mmol/L 139  144  139   Potassium 3.5 - 5.1 mmol/L 3.7  4.4  4.1   Chloride 98 - 111 mmol/L 104  101  98   CO2 22 - 32 mmol/L 30  30  38   Calcium  8.9 - 10.3 mg/dL 8.9  9.9  9.5   Total Protein 6.5 - 8.1 g/dL 8.1  8.5    Total Bilirubin 0.3 - 1.2 mg/dL 0.8  0.5    Alkaline Phos 38 - 126 U/L 83  96    AST 15 - 41 U/L 17  21    ALT 0 - 44 U/L 10  14      Lipid Panel     Component Value Date/Time   CHOL 201 (H) 02/06/2022 1628   TRIG 110 02/06/2022 1628   HDL 44 02/06/2022 1628   CHOLHDL 4.6 (H) 02/06/2022 1628   CHOLHDL 4.8 07/31/2019 0901   VLDL 18 07/31/2019 0901   LDLCALC 137 (H) 02/06/2022 1628    CBC    Component Value Date/Time   WBC 7.5 12/27/2023 1610   WBC 6.5 11/18/2022 0342   RBC 4.89 12/27/2023 1610   RBC 5.03 11/18/2022 0342   HGB 15.1 12/27/2023 1610   HCT 47.5 (H) 12/27/2023 1610   PLT 180 12/27/2023 1610   MCV 97 12/27/2023 1610   MCH 30.9 12/27/2023 1610   MCH 31.4 11/18/2022 0342   MCHC 31.8 12/27/2023 1610   MCHC 33.8 11/18/2022 0342   RDW 12.7 12/27/2023 1610   LYMPHSABS 3.5 (H) 12/27/2023  1610   MONOABS 0.6 11/18/2022 0342   EOSABS 0.2 12/27/2023 1610   BASOSABS 0.1 12/27/2023 1610    Lab Results  Component Value Date   HGBA1C 6.2 05/15/2024   Lab Results  Component Value Date   HGBA1C 6.2 05/15/2024   HGBA1C 5.8 (H) 12/27/2023   HGBA1C 6.0 (H) 02/06/2022    The 10-year ASCVD risk score (Arnett DK, et al., 2019) is: 19.3%   Values used to calculate the score:     Age: 51 years     Clinically relevant sex: Female     Is Non-Hispanic African American: Yes     Diabetic: No     Tobacco smoker: Yes     Systolic Blood Pressure: 133 mmHg     Is BP treated: Yes     HDL Cholesterol: 44 mg/dL     Total Cholesterol: 201 mg/dL      Assessment & Plan Peripheral neuropathy Chronic tingling and numbness in legs for 2-3 months. Differential includes diabetes and vitamin B12  deficiency. A1c 6.2 indicates increased prediabetes. - Ordered vitamin B12 level. - Ordered kidney and liver function tests. - Prescribed gabapentin  at night.  Primary osteoarthritis of both shoulders Chronic pain and cracking in shoulders, worsened by movement. Topical treatments ineffective. - Prescribed meloxicam .  Prediabetes A1c increased from 5.8 to 6.2, indicating increased prediabetes. Lifestyle changes needed for glucose management. - Advised low carbohydrate diet. - Recommended exercise and weight loss.  Essential hypertension Blood pressure managed with amlodipine . - Refilled amlodipine  prescription.  Dyslipidemia Uncontrolled Managed with atorvastatin . - Refilled atorvastatin  prescription.  Chronic obstructive pulmonary disease COPD with dyspnea on exertion. Smoking cessation crucial. - Encouraged smoking cessation. - Discussed Wellbutrin  for smoking cessation.  Nicotine  dependence, cigarettes Continued smoking despite COPD. Addressed Wellbutrin  side effects, emphasized smoking risks. - Encouraged smoking cessation. - Discussed Wellbutrin  for smoking cessation. -Smoking cessation support: smoking cessation hotline: 1-800-QUIT-NOW.  Smoking cessation classes are available through [facility]. Call [phone] or visit our website at [email or web address].  Spent 3 minutes counseling on dangers of tobacco use and benefits of quitting, offered pharmacological intervention to aid quitting and patient is ready to quit.   General health maintenance Due for routine screenings. - Scheduled physical exam for Pap smear, colonoscopy, and mammogram.       Meds ordered this encounter  Medications   meloxicam  (MOBIC ) 7.5 MG tablet    Sig: Take 1 tablet (7.5 mg total) by mouth daily.    Dispense:  30 tablet    Refill:  1   amLODipine  (NORVASC ) 10 MG tablet    Sig: Take 1 tablet (10 mg total) by mouth daily.    Dispense:  90 tablet     Refill:  1   atorvastatin  (LIPITOR) 10 MG tablet    Sig: Take 1 tablet (10 mg total) by mouth daily.    Dispense:  90 tablet    Refill:  1   gabapentin  (NEURONTIN ) 300 MG capsule    Sig: Take 1 capsule (300 mg total) by mouth at bedtime.    Dispense:  30 capsule    Refill:  3    Follow-up: Return in about 6 weeks (around 06/26/2024) for CPE/ Preventive Health Exam.       Corrina Sabin, MD, FAAFP. [facility] [place], [place] [phone]   05/15/2024, 11:49 AM    [1] No Known Allergies  "

## 2024-05-15 NOTE — Patient Instructions (Signed)
 VISIT SUMMARY:  Today, we discussed your ongoing health concerns, including tingling in your legs and feet, shoulder pain, and your prediabetes management. We also reviewed your current medications and discussed lifestyle changes to improve your overall health.  YOUR PLAN:  -PERIPHERAL NEUROPATHY: Peripheral neuropathy is a condition that causes tingling and numbness in your legs and feet. We will check your vitamin B12 levels and kidney and liver function. You have been prescribed gabapentin  to take at night to help with the tingling.  -PRIMARY OSTEOARTHRITIS OF BOTH SHOULDERS: Osteoarthritis is a type of arthritis that occurs when the protective cartilage that cushions the ends of your bones wears down over time. You have been prescribed meloxicam  to help manage the pain and cracking in your shoulders.  -PREDIABETES: Prediabetes means your blood sugar levels are higher than normal but not yet high enough to be diagnosed as diabetes. Your A1c has increased, indicating a need for lifestyle changes. You should follow a low carbohydrate diet, exercise regularly, and aim for weight loss.  -ESSENTIAL HYPERTENSION: Essential hypertension is high blood pressure with no identifiable cause. Your blood pressure is being managed with amlodipine , and your prescription has been refilled.  -DYSLIPIDEMIA: Dyslipidemia means you have abnormal levels of cholesterol in your blood. Your condition is being managed with atorvastatin , and your prescription has been refilled.  -CHRONIC OBSTRUCTIVE PULMONARY DISEASE (COPD): COPD is a chronic lung disease that makes it hard to breathe. It is crucial that you stop smoking to manage your COPD. We discussed the possibility of using Wellbutrin  to help you quit smoking.  -NICOTINE  DEPENDENCE, CIGARETTES: Nicotine  dependence means you are addicted to the nicotine  in cigarettes. Quitting smoking is very important for your health, especially with your COPD. We discussed the side  effects of Wellbutrin  and how it might help you quit smoking.  -GENERAL HEALTH MAINTENANCE: You are due for routine health screenings. We have scheduled a physical exam, Pap smear, colonoscopy, and mammogram to ensure your overall health is monitored.  INSTRUCTIONS:  Please follow up with the lab tests for vitamin B12, kidney, and liver function. Take gabapentin  at night as prescribed. Start meloxicam  for your shoulder pain. Follow a low carbohydrate diet, exercise regularly, and aim for weight loss to manage your prediabetes. Continue taking amlodipine  and atorvastatin  as prescribed. Work on quitting smoking, and consider using Wellbutrin  to help with this. Attend your scheduled physical exam, Pap smear, colonoscopy, and mammogram.

## 2024-05-16 ENCOUNTER — Ambulatory Visit: Payer: Self-pay | Admitting: Family Medicine

## 2024-05-16 LAB — CMP14+EGFR
ALT: 9 IU/L (ref 0–32)
AST: 15 IU/L (ref 0–40)
Albumin: 4.8 g/dL (ref 3.9–4.9)
Alkaline Phosphatase: 88 IU/L (ref 49–135)
BUN/Creatinine Ratio: 16 (ref 12–28)
BUN: 13 mg/dL (ref 8–27)
Bilirubin Total: 0.3 mg/dL (ref 0.0–1.2)
CO2: 28 mmol/L (ref 20–29)
Calcium: 10 mg/dL (ref 8.7–10.3)
Chloride: 100 mmol/L (ref 96–106)
Creatinine, Ser: 0.8 mg/dL (ref 0.57–1.00)
Globulin, Total: 3 g/dL (ref 1.5–4.5)
Glucose: 81 mg/dL (ref 70–99)
Potassium: 4.3 mmol/L (ref 3.5–5.2)
Sodium: 141 mmol/L (ref 134–144)
Total Protein: 7.8 g/dL (ref 6.0–8.5)
eGFR: 82 mL/min/1.73

## 2024-05-16 LAB — VITAMIN B12: Vitamin B-12: 764 pg/mL (ref 232–1245)

## 2024-05-31 ENCOUNTER — Ambulatory Visit: Payer: Self-pay

## 2024-05-31 ENCOUNTER — Emergency Department (HOSPITAL_COMMUNITY): Admission: EM | Admit: 2024-05-31 | Discharge: 2024-06-01 | Disposition: A | Discharge status: Home / Self Care

## 2024-05-31 ENCOUNTER — Emergency Department (HOSPITAL_COMMUNITY)

## 2024-05-31 DIAGNOSIS — R0789 Other chest pain: Secondary | ICD-10-CM | POA: Diagnosis present

## 2024-05-31 DIAGNOSIS — I10 Essential (primary) hypertension: Secondary | ICD-10-CM | POA: Diagnosis not present

## 2024-05-31 DIAGNOSIS — E119 Type 2 diabetes mellitus without complications: Secondary | ICD-10-CM | POA: Insufficient documentation

## 2024-05-31 DIAGNOSIS — J441 Chronic obstructive pulmonary disease with (acute) exacerbation: Secondary | ICD-10-CM | POA: Insufficient documentation

## 2024-05-31 DIAGNOSIS — Z79899 Other long term (current) drug therapy: Secondary | ICD-10-CM | POA: Diagnosis not present

## 2024-05-31 LAB — CBC
HCT: 47 % — ABNORMAL HIGH (ref 36.0–46.0)
Hemoglobin: 15.6 g/dL — ABNORMAL HIGH (ref 12.0–15.0)
MCH: 32 pg (ref 26.0–34.0)
MCHC: 33.2 g/dL (ref 30.0–36.0)
MCV: 96.3 fL (ref 80.0–100.0)
Platelets: 190 K/uL (ref 150–400)
RBC: 4.88 MIL/uL (ref 3.87–5.11)
RDW: 12.3 % (ref 11.5–15.5)
WBC: 5.8 K/uL (ref 4.0–10.5)
nRBC: 0 % (ref 0.0–0.2)

## 2024-05-31 LAB — BASIC METABOLIC PANEL WITH GFR
Anion gap: 9 (ref 5–15)
BUN: 9 mg/dL (ref 8–23)
CO2: 33 mmol/L — ABNORMAL HIGH (ref 22–32)
Calcium: 9.7 mg/dL (ref 8.9–10.3)
Chloride: 100 mmol/L (ref 98–111)
Creatinine, Ser: 0.75 mg/dL (ref 0.44–1.00)
GFR, Estimated: >60 mL/min
Glucose, Bld: 101 mg/dL — ABNORMAL HIGH (ref 70–99)
Potassium: 4 mmol/L (ref 3.5–5.1)
Sodium: 141 mmol/L (ref 135–145)

## 2024-05-31 MED ORDER — ALBUTEROL SULFATE (2.5 MG/3ML) 0.083% IN NEBU
5.0000 mg | INHALATION_SOLUTION | Freq: Once | RESPIRATORY_TRACT | Status: DC
  Filled 2024-05-31: qty 6

## 2024-05-31 MED ORDER — PREDNISONE 20 MG PO TABS
40.0000 mg | ORAL_TABLET | Freq: Once | ORAL | Status: AC
  Administered 2024-05-31: 40 mg via ORAL
  Filled 2024-05-31: qty 2

## 2024-05-31 MED ORDER — ALBUTEROL SULFATE (2.5 MG/3ML) 0.083% IN NEBU
5.0000 mg | INHALATION_SOLUTION | Freq: Once | RESPIRATORY_TRACT | Status: AC
  Administered 2024-05-31: 5 mg via RESPIRATORY_TRACT
  Filled 2024-05-31: qty 6

## 2024-05-31 MED ORDER — IPRATROPIUM BROMIDE 0.02 % IN SOLN
0.5000 mg | Freq: Once | RESPIRATORY_TRACT | Status: AC
  Administered 2024-05-31: 0.5 mg via RESPIRATORY_TRACT
  Filled 2024-05-31: qty 2.5

## 2024-05-31 NOTE — ED Triage Notes (Signed)
 Patient states she has flu like symptoms x 2 days. Cough, chest congestion, short of breath, and headache. Patient rates her pain 8/10 due to chest congestion. Patient states her oxygen sat was running 90% and her MD informed her to come to ED to be checked. O2 sat at triage 94% RA.

## 2024-05-31 NOTE — ED Notes (Signed)
" °  Patient ambulated with portable pulse oximeter.  Patient started out at 90% before leaving the room and ambulated up and down the main hallway.  Patient dropped to 84% but denied any SOB, dizziness, or dyspnea.  Margit PA notified.  "

## 2024-05-31 NOTE — Telephone Encounter (Signed)
 FYI Only or Action Required?: FYI only for provider: ED advised.  Patient was last seen in primary care on 05/15/2024 by Newlin, Enobong, MD.  Called Nurse Triage reporting Cough.  Symptoms began yesterday.  Interventions attempted: Nothing.  Symptoms are: gradually worsening.  Triage Disposition: No disposition on file.  Patient/caregiver understands and will follow disposition?:    Reason for Disposition  Patient sounds very sick or weak to the triager    O2 89%  Answer Assessment - Initial Assessment Questions O2 89%. Patient does not use supplemental oxygen. Patient states usual O2 level is around 94%. Denies SOB or chest pain, dizziness. Advised ED, patient stated her son will drive her.   1. ONSET: When did the cough begin?      Yesterday  2. SEVERITY: How bad is the cough today?      *No Answer* 3. SPUTUM: Describe the color of your sputum (e.g., none, dry cough; clear, white, yellow, green)     Not coughing up anything  4. HEMOPTYSIS: Are you coughing up any blood? If Yes, ask: How much? (e.g., flecks, streaks, tablespoons, etc.)     Denies  5. DIFFICULTY BREATHING: Are you having difficulty breathing? If Yes, ask: How bad is it? (e.g., mild, moderate, severe)      Denies  6. FEVER: Do you have a fever? If Yes, ask: What is your temperature, how was it measured, and when did it start?     Denies  7. CARDIAC HISTORY: Do you have any history of heart disease? (e.g., heart attack, congestive heart failure)      Denies  8. LUNG HISTORY: Do you have any history of lung disease?  (e.g., pulmonary embolus, asthma, emphysema)     COPD  9. OTHER SYMPTOMS: Do you have any other symptoms? (e.g., runny nose, wheezing, chest pain)       Headaches, runny nose  Protocols used: Cough - Acute Non-Productive-A-AH  Message from [place] B sent at 05/31/2024  1:17 PM EST  Reason for Triage: Pt experiencing congestion, runny nose, non-productive cough, and  headaches. She wants to know if an antibiotic can be sent to [facility] or does she need to be seen in clinic or urgent care.

## 2024-05-31 NOTE — ED Notes (Signed)
 Pt walked back with no complaints from triage.

## 2024-05-31 NOTE — ED Notes (Signed)
 Patient refused Blood work at this time. Patient states she will wait until provider will see her.

## 2024-05-31 NOTE — ED Provider Notes (Signed)
 " [facility] EMERGENCY DEPARTMENT AT [facility] Provider Note   CSN: [id number] Arrival date & time: 05/31/24  1523     Patient presents with: No chief complaint on file.   Marissa Owens is a 64 y.o. female.   Patient with history of COPD, HTN, T2DM presents with cough, chest congestion and tightness, nasal congestion, feeling poorly. No fever. She noted her oxygen level at home to be 89-90% and was advised by her doctor to present to the ED. No home O2. No lower extremity edema. No vomiting or diarrhea.   The history is provided by the patient. No language interpreter was used.       Prior to Admission medications  Medication Sig Start Date End Date Taking? Authorizing Provider  albuterol  (PROVENTIL ) (2.5 MG/3ML) 0.083% nebulizer solution Take 3 mLs (2.5 mg total) by nebulization every 6 (six) hours as needed for wheezing or shortness of breath. 04/25/24   Parrett, Madelin RAMAN, NP  albuterol  (VENTOLIN  HFA) 108 (90 Base) MCG/ACT inhaler Inhale 2 puffs into the lungs every 6 (six) hours as needed for wheezing or shortness of breath. 04/07/23   [place] Jon HERO, PA-C  amLODipine  (NORVASC ) 10 MG tablet Take 1 tablet (10 mg total) by mouth daily. 05/15/24   Newlin, Enobong, MD  atorvastatin  (LIPITOR) 10 MG tablet Take 1 tablet (10 mg total) by mouth daily. 05/15/24   Newlin, Enobong, MD  budesonide-glycopyrrolate-formoterol (BREZTRI  AEROSPHERE) 160-9-4.8 MCG/ACT AERO inhaler Inhale 2 puffs into the lungs in the morning and at bedtime. 01/17/24   Mannam, Praveen, MD  buPROPion  (WELLBUTRIN  SR) 150 MG 12 hr tablet Take 1 tablet (150 mg total) by mouth 2 (two) times daily. Patient not taking: Reported on 05/15/2024 12/27/23   Fleming, Zelda W, NP  divalproex  (DEPAKOTE  ER) 500 MG 24 hr tablet Take 1,000 mg by mouth at bedtime. 02/13/21   [provider]  doxycycline  (VIBRA -TABS) 100 MG tablet Take 1 tablet (100 mg total) by mouth 2 (two) times daily. Patient not taking: Reported  on 05/15/2024 04/25/24   Parrett, Madelin RAMAN, NP  gabapentin  (NEURONTIN ) 300 MG capsule Take 1 capsule (300 mg total) by mouth at bedtime. 05/15/24   Newlin, Enobong, MD  hydrocortisone  2.5 % cream Apply topically 2 (two) times daily. Patient not taking: Reported on 05/15/2024 12/27/23   Fleming, Zelda W, NP  meloxicam  (MOBIC ) 7.5 MG tablet Take 1 tablet (7.5 mg total) by mouth daily. 05/15/24   Newlin, Enobong, MD  mirabegron  ER (MYRBETRIQ ) 25 MG TB24 tablet Take 1 tablet (25 mg total) by mouth daily. 12/28/23   Fleming, Zelda W, NP  predniSONE  (DELTASONE ) 10 MG tablet 4 tabs tomorrow, then 3 tabs for 2 days, 2 tabs for 2 days, then 1 tab for 2 days, then stop 06/01/24   Odell Balls, PA-C  Respiratory Therapy Supplies (ADULT MASK) MISC Needs an mask for her nebulizer machine 07/03/19   Fulp, Cammie, MD  sertraline (ZOLOFT) 100 MG tablet Take 100 mg by mouth daily. 01/22/22   [provider]    Allergies: Patient has no known allergies.    Review of Systems  Updated Vital Signs BP 124/86   Pulse 76   Temp (!) 97.4 F (36.3 C) (Oral)   Resp 20   Ht 5' 2 (1.575 m)   Wt 72.8 kg   SpO2 90%   BMI 29.37 kg/m   Physical Exam Vitals and nursing note reviewed.  Constitutional:      Appearance: She is well-developed.  HENT:     Head: Normocephalic.  Cardiovascular:     Rate and Rhythm: Normal rate and regular rhythm.  Pulmonary:     Effort: Pulmonary effort is normal.     Breath sounds: Wheezing (Mild wheezing greater at bases. Good air movement.) present. No rhonchi or rales.  Abdominal:     Palpations: Abdomen is soft.     Tenderness: There is no abdominal tenderness. There is no guarding or rebound.  Musculoskeletal:        General: Normal range of motion.     Cervical back: Normal range of motion and neck supple.     Right lower leg: No edema.     Left lower leg: No edema.  Skin:    General: Skin is warm and dry.  Neurological:     General: No focal deficit present.      Mental Status: She is alert and oriented to person, place, and time.     (all labs ordered are listed, but only abnormal results are displayed) Labs Reviewed  BASIC METABOLIC PANEL WITH GFR - Abnormal; Notable for the following components:      Result Value   CO2 33 (*)    Glucose, Bld 101 (*)    All other components within normal limits  CBC - Abnormal; Notable for the following components:   Hemoglobin 15.6 (*)    HCT 47.0 (*)    All other components within normal limits    EKG: None  Radiology: DG Chest 2 View Result Date: 05/31/2024 EXAM: 2 VIEW(S) XRAY OF THE CHEST 05/31/2024 08:16:42 PM COMPARISON: 02/23/2024 CLINICAL HISTORY: SOB FINDINGS: LUNGS AND PLEURA: No focal pulmonary opacity. No pleural effusion. No pneumothorax. HEART AND MEDIASTINUM: No acute abnormality of the cardiac and mediastinal silhouettes. BONES AND SOFT TISSUES: No acute osseous abnormality. IMPRESSION: 1. No acute process. Electronically signed by: Elsie Gravely MD 05/31/2024 08:26 PM EST RP Workstation: [id number]     Procedures   Medications Ordered in the ED  albuterol  (PROVENTIL ) (2.5 MG/3ML) 0.083% nebulizer solution 5 mg (5 mg Nebulization Given 05/31/24 2220)  ipratropium (ATROVENT ) nebulizer solution 0.5 mg (0.5 mg Nebulization Given 05/31/24 2220)  predniSONE  (DELTASONE ) tablet 40 mg (40 mg Oral Given 05/31/24 2220)    Clinical Course as of 06/01/24 0014  Wed May 31, 2024  2208 Patient to ED with 2-3 days of URI symptoms without fever. Noted low oxygen levels today. She has a history of COPD, not on home O2. She describes chest tightness without pain.   She is overall well appearing, in NAD. Here she is 95% on RA. CXR without evidence of PNA. Will provide duoNeb, start prednisone . Will do ambulatory pulse ox after neb to evaluate for any exertional hypoxia. Patient declined labs, stating she just wanted to make sure she did not have xray signs of pneumonia. [SU]  Thu Jun 01, 2024  0007 She has  had prednisone  and a duoNeb and feels that her SOB is improved. She ambulates to and from the bathroom and denies feeling dyspneic. O2 saturations fluctuating between 88-92%. Discussed with Dr. Gennaro who feels she is stable for discharge home. Will continue prednisone  taper for 5 days. Encourage PCP follow up and strict return precautions and low threshold to return to ED. She did allow labs which are stable and nonconcerning.  [SU]    Clinical Course User Index [SU] Odell Balls, PA-C  Medical Decision Making Amount and/or Complexity of Data Reviewed Labs: ordered. Radiology: ordered.  Risk Prescription drug management.        Final diagnoses:  COPD exacerbation [facility])    ED Discharge Orders          Ordered    predniSONE  (DELTASONE ) 10 MG tablet        06/01/24 0012               Odell Balls, PA-C 06/01/24 0014    Kammerer, Megan L, DO 06/02/24 0145  "

## 2024-05-31 NOTE — ED Notes (Signed)
Urine has been sent down to lab.

## 2024-06-01 MED ORDER — PREDNISONE 10 MG PO TABS: ORAL_TABLET | ORAL | 0 refills | Status: DC

## 2024-06-01 NOTE — Discharge Instructions (Addendum)
 Continue regular use of your nebulized medications and rescue inhaler for shortness of breath. Take prednisone  as prescribed. Follow up with your doctor if symptoms do not improve, and return to the ED if symptoms worsen.

## 2024-06-12 ENCOUNTER — Telehealth: Payer: Self-pay

## 2024-06-12 NOTE — Telephone Encounter (Signed)
 Copied from CRM [id number]. Topic: Clinical - Medical Advice >> Jun 12, 2024 12:10 PM Leila BROCKS wrote: Reason for CRM: Patient [phone] states needs a letter or recommendation instead of full time to reduce to part time like 3 days a week due patient's chronic disease? Patient was seeing Dr. Meade and unsure which provider patient is seeing now. Please advise and call back.

## 2024-06-12 NOTE — Telephone Encounter (Signed)
 Patient has been called and given appointment to discuss letter.

## 2024-06-13 ENCOUNTER — Encounter: Payer: Self-pay | Admitting: Family Medicine

## 2024-06-13 ENCOUNTER — Ambulatory Visit: Payer: Self-pay | Admitting: Family Medicine

## 2024-06-13 DIAGNOSIS — J441 Chronic obstructive pulmonary disease with (acute) exacerbation: Secondary | ICD-10-CM | POA: Diagnosis not present

## 2024-06-13 DIAGNOSIS — Z029 Encounter for administrative examinations, unspecified: Secondary | ICD-10-CM

## 2024-06-13 NOTE — Progress Notes (Signed)
 "  Virtual Visit via Video Note  I connected with Marissa Owens, on 06/13/2024 at 1:31 PM by video enabled telemedicine device and verified that I am speaking with the correct person using two identifiers.   Consent: I discussed the limitations, risks, security and privacy concerns of performing an evaluation and management service by telemedicine and the availability of in person appointments. I also discussed with the patient that there may be a patient responsible charge related to this service. The patient expressed understanding and agreed to proceed.   Location of Patient: Home  Location of Provider: Clinic   Persons participating in Telemedicine visit: DUSTINA SCOGGIN Dr. Delbert    Discussed the use of AI scribe software for clinical note transcription with the patient, who gave verbal consent to proceed.  History of Present Illness Marissa Owens is a 64 year old female with  a history of COPD, hypertension, nicotine  dependence, prediabetes  who presents with shortness of breath and requests a reduction in work hours.  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 She was recently seen in the emergency room for a COPD exacerbation and notes her oxygen levels drop with exertion. She uses a rescue inhaler but does not have supplemental oxygen at home. She works 11 PM to 7 AM and is requesting reduction to three 8-hour night shifts weekly due to her respiratory limitations and has been unable to work this week. She is also needing a letter to excuse her from work for the rest of the week.     Past Medical History:  Diagnosis Date   COPD (chronic obstructive pulmonary disease) (HCC)    Diabetes mellitus without complication (HCC)    Hypertension    Renal disorder    kidney stone   Allergies[1]  Medications Ordered Prior to Encounter[2]  ROS: See  HPI  Observations/Objective: Awake, alert, oriented x3 Not in acute distress Normal mood      Latest Ref Rng & Units 05/31/2024   10:30 PM 05/15/2024   12:05 PM 11/18/2022    3:42 AM  CMP  Glucose 70 - 99 mg/dL 898  81  899   BUN 8 - 23 mg/dL 9  13  9    Creatinine 0.44 - 1.00 mg/dL 9.24  9.19  9.28   Sodium 135 - 145 mmol/L 141  141  139   Potassium 3.5 - 5.1 mmol/L 4.0  4.3  3.7   Chloride 98 - 111 mmol/L 100  100  104   CO2 22 - 32 mmol/L 33  28  30   Calcium  8.9 - 10.3 mg/dL 9.7  89.9  8.9   Total Protein 6.0 - 8.5 g/dL  7.8  8.1   Total Bilirubin 0.0 - 1.2 mg/dL  0.3  0.8   Alkaline Phos 49 - 135 IU/L  88  83   AST 0 - 40 IU/L  15  17   ALT 0 - 32 IU/L  9  10     Lipid Panel     Component Value Date/Time   CHOL 201 (H) 02/06/2022 1628   TRIG 110 02/06/2022 1628   HDL 44 02/06/2022 1628   CHOLHDL 4.6 (H) 02/06/2022 1628   CHOLHDL 4.8 07/31/2019 0901   VLDL 18 07/31/2019 0901   LDLCALC 137 (H) 02/06/2022 1628   LABVLDL 20 02/06/2022 1628    Lab Results  Component Value Date   HGBA1C 6.2 05/15/2024     Assessment and plan:  Assessment & Plan Chronic obstructive pulmonary disease with acute exacerbation/administrative encounter COPD exacerbation with increased dyspnea, requiring frequent rest. Oxygen saturation drops with exertion. No current oxygen therapy. Night shifts contribute to symptoms. -She will need an office visit to evaluate her for supplemental oxygen - Wrote letter to reduce work hours to three days a week, eight-hour shifts. - Wrote letter to excuse from work for the rest of the week, return on Monday. - Recommended evaluation for oxygen therapy qualification by walking test.      No orders of the defined types were placed in this encounter.   Follow Up Instructions: Keep previously scheduled appointment   I discussed the assessment and treatment plan with the patient. The patient was provided an opportunity to ask questions and all were  answered. The patient agreed with the plan and demonstrated an understanding of the instructions.   The patient was advised to call back or seek an in-person evaluation if the symptoms worsen or if the condition fails to improve as anticipated.     I provided 11 minutes total of Telehealth time during this encounter including median intraservice time, reviewing previous notes, investigations, ordering medications, medical decision making, coordinating care and patient verbalized understanding at the end of the visit.     Corrina Sabin, MD, FAAFP. [facility] and [facility] [place], [place] [phone]   06/13/2024, 1:31 PM     [1] No Known Allergies [2]  Current Outpatient Medications on File Prior to Visit  Medication Sig Dispense Refill   albuterol  (PROVENTIL ) (2.5 MG/3ML) 0.083% nebulizer solution Take 3 mLs (2.5 mg total) by nebulization every 6 (six) hours as needed for wheezing or shortness of breath. 270 mL 5   albuterol  (VENTOLIN  HFA) 108 (90 Base) MCG/ACT inhaler Inhale 2 puffs into the lungs every 6 (six) hours as needed for wheezing or shortness of breath. 26.8 g 2   amLODipine  (NORVASC ) 10 MG tablet Take 1 tablet (10 mg total) by mouth daily. 90 tablet 1   atorvastatin  (LIPITOR) 10 MG tablet Take 1 tablet (10 mg total) by mouth daily. 90 tablet 1   budesonide-glycopyrrolate-formoterol (BREZTRI  AEROSPHERE) 160-9-4.8 MCG/ACT AERO inhaler Inhale 2 puffs into the lungs in the morning and at bedtime.     buPROPion  (WELLBUTRIN  SR) 150 MG 12 hr tablet Take 1 tablet (150 mg total) by mouth 2 (two) times daily. (Patient not taking: Reported on 05/15/2024) 60 tablet 5   divalproex  (DEPAKOTE  ER) 500 MG 24 hr tablet Take 1,000 mg by mouth at bedtime.     doxycycline  (VIBRA -TABS) 100 MG tablet Take 1 tablet (100 mg total) by mouth 2 (two) times daily. (Patient not taking: Reported on 05/15/2024) 14 tablet 0   gabapentin  (NEURONTIN ) 300 MG capsule Take 1 capsule  (300 mg total) by mouth at bedtime. 30 capsule 3   hydrocortisone  2.5 % cream Apply topically 2 (two) times daily. (Patient not taking: Reported on 05/15/2024) 30 g 1   meloxicam  (MOBIC ) 7.5 MG tablet Take 1 tablet (7.5 mg total) by mouth daily. 30 tablet 1   mirabegron  ER (MYRBETRIQ ) 25 MG TB24 tablet Take 1 tablet (25 mg total) by mouth daily. 30 tablet 0   predniSONE  (DELTASONE ) 10 MG tablet 4 tabs tomorrow, then 3 tabs for 2 days, 2 tabs for 2 days, then 1 tab for 2 days, then stop 16 tablet 0   Respiratory Therapy Supplies (ADULT  MASK) MISC Needs an mask for her nebulizer machine 1 each 0   sertraline (ZOLOFT) 100 MG tablet Take 100 mg by mouth daily.     No current facility-administered medications on file prior to visit.   "

## 2024-06-21 ENCOUNTER — Telehealth: Payer: Self-pay

## 2024-06-21 NOTE — Telephone Encounter (Signed)
 LVM informing patient that letter is ready for pick up.

## 2024-06-21 NOTE — Telephone Encounter (Signed)
 Letter has been updated.

## 2024-06-21 NOTE — Telephone Encounter (Signed)
 Copied from CRM #[id number]. Topic: General - Other >> Jun 21, 2024  2:01 PM   Delon DASEN wrote:  Reason for CRM: Patient calling for follow up on letter - [phone]

## 2024-06-21 NOTE — Telephone Encounter (Signed)
 Patient is requesting letter get updated to 4 working days a week. She has to work at least 4 days a week to keep her insurance.

## 2024-06-21 NOTE — Telephone Encounter (Signed)
 Provider has not addressed call at this time.

## 2024-06-22 NOTE — Telephone Encounter (Signed)
 Patient came in the office today and picked up the letter.

## 2024-06-27 ENCOUNTER — Ambulatory Visit: Admitting: Adult Health

## 2024-06-27 ENCOUNTER — Encounter: Payer: Self-pay | Admitting: Adult Health

## 2024-06-27 ENCOUNTER — Telehealth: Payer: Self-pay

## 2024-06-27 ENCOUNTER — Other Ambulatory Visit: Payer: Self-pay

## 2024-06-27 ENCOUNTER — Ambulatory Visit

## 2024-06-27 VITALS — BP 130/79 | HR 77 | Temp 98.0°F | Ht 64.25 in | Wt 159.6 lb

## 2024-06-27 DIAGNOSIS — J441 Chronic obstructive pulmonary disease with (acute) exacerbation: Secondary | ICD-10-CM

## 2024-06-27 DIAGNOSIS — Z87891 Personal history of nicotine dependence: Secondary | ICD-10-CM

## 2024-06-27 DIAGNOSIS — F17210 Nicotine dependence, cigarettes, uncomplicated: Secondary | ICD-10-CM

## 2024-06-27 DIAGNOSIS — J449 Chronic obstructive pulmonary disease, unspecified: Secondary | ICD-10-CM | POA: Diagnosis not present

## 2024-06-27 DIAGNOSIS — Z122 Encounter for screening for malignant neoplasm of respiratory organs: Secondary | ICD-10-CM

## 2024-06-27 LAB — PULMONARY FUNCTION TEST
DL/VA % pred: 72 %
DL/VA: 3.03 ml/min/mmHg/L
DLCO cor % pred: 53 %
DLCO cor: 10.69 ml/min/mmHg
DLCO unc % pred: 56 %
DLCO unc: 11.34 ml/min/mmHg
FEF 25-75 Post: 0.48 L/s
FEF 25-75 Pre: 0.54 L/s
FEF2575-%Change-Post: -11 %
FEF2575-%Pred-Post: 21 %
FEF2575-%Pred-Pre: 24 %
FEV1-%Change-Post: -4 %
FEV1-%Pred-Post: 40 %
FEV1-%Pred-Pre: 42 %
FEV1-Post: 1 L
FEV1-Pre: 1.05 L
FEV1FVC-%Change-Post: -9 %
FEV1FVC-%Pred-Pre: 86 %
FEV6-%Change-Post: 5 %
FEV6-%Pred-Post: 52 %
FEV6-%Pred-Pre: 50 %
FEV6-Post: 1.63 L
FEV6-Pre: 1.55 L
FEV6FVC-%Change-Post: 0 %
FEV6FVC-%Pred-Post: 101 %
FEV6FVC-%Pred-Pre: 102 %
FVC-%Change-Post: 6 %
FVC-%Pred-Post: 51 %
FVC-%Pred-Pre: 48 %
FVC-Post: 1.67 L
FVC-Pre: 1.57 L
Post FEV1/FVC ratio: 60 %
Post FEV6/FVC ratio: 98 %
Pre FEV1/FVC ratio: 67 %
Pre FEV6/FVC Ratio: 98 %
RV % pred: 289 %
RV: 6.02 L
TLC % pred: 154 %
TLC: 7.88 L

## 2024-06-27 MED ORDER — BREZTRI AEROSPHERE 160-9-4.8 MCG/ACT IN AERO: 2.0000 | INHALATION_SPRAY | Freq: Two times a day (BID) | RESPIRATORY_TRACT | 5 refills | Status: AC

## 2024-06-27 NOTE — Telephone Encounter (Signed)
 Per Madelin Stank, NP, pt was due for LCS CT in December 2025 and still needs to be scheduled. Please advise, thank you!

## 2024-06-27 NOTE — Telephone Encounter (Signed)
 Letter was sent to the pt.  I will call tomorrow.

## 2024-06-27 NOTE — Progress Notes (Signed)
 Full pft performed today

## 2024-06-27 NOTE — Telephone Encounter (Signed)
 Pt completed her portion of Dup[employer] paperwork at her office visit with Tammy Parrett on 06/27/24. Paperwork was completed and signed by Madelin Stank, NP and placed in the pharmacy bin at the front desk.

## 2024-06-27 NOTE — Patient Instructions (Signed)
 Full pft performed today

## 2024-06-27 NOTE — Progress Notes (Unsigned)
 "  @Patient  ID: Marissa Owens, female    DOB: 1961-02-13, 64 y.o.   MRN: [id number]  Chief Complaint  Patient presents with   Medical Management of Chronic Issues    Pft F/U    Referring provider: Delbert Clam, MD  HPI: 64 year old female active smoker followed for COPD with emphysema Participates in the lung cancer CT chest screening program Works in the medical field as a CNA-night shift  Medical history significant for depression   TEST/EVENTS : Reviewed 06/27/2024  CT chest May 11, 2023 showed moderate emphysema, scattered lung nodules maximum 5.9 mm, lung RADS 2   2014 EOS 400   Discussed the use of AI scribe software for clinical note transcription with the patient, who gave verbal consent to proceed.  History of Present Illness Marissa Owens is a 64 year old female with COPD who presents for a follow-up after a recent COPD flare-up.  Last seen in December , she experienced a flare-up of her COPD symptoms, She was treated with Doxycycline  and prednisone  taper. Symptoms improved but had another flare last month went to ER treated with prednisone  taper.  She is feeling better with decreased cough and congestion. Chest xray in ER showed no acute process.   She continues to use Breztri  inhaler twice daily.   She has a history of smoking and wants to quit, noting that she has cravings. She is considering Wellbutrin  to help with smoking cessation but has not yet started it. She is also on Depakote , which was prescribed by her mental health provider.  She was supposed to have a CT scan in December for lung cancer screening but did not complete it. She is concerned about the impact of her health on her ability to work as a LAWYER, especially given her frequent absences due to health issues. She is considering applying for disability due to her health condition and the associated stress of missing work.  She works night shifts at [employer] and is concerned about  maintaining her insurance coverage, which is crucial for her medical needs.  PFT done show severe airflow obstruction with FEV1 at 42%, ratio of 67, FVC 48%, no significant bronchodilator response, DLCO at 56%.   Allergies[1]  Immunization History  Administered Date(s) Administered   Influenza, Seasonal, Injecte, Preservative Fre 05/15/2024   Influenza,inj,Quad PF,6+ Mos 04/14/2018, 04/24/2019, 04/24/2020, 02/06/2022   PFIZER(Purple Top)SARS-COV-2 Vaccination 09/16/2019, 10/05/2019   PPD Test 10/05/2022, 11/10/2023   Tdap 02/06/2022    Past Medical History:  Diagnosis Date   COPD (chronic obstructive pulmonary disease) (HCC)    Diabetes mellitus without complication (HCC)    Hypertension    Renal disorder    kidney stone    Tobacco History: Tobacco Use History[2] Ready to quit: Not Answered Counseling given: Not Answered Tobacco comments: Started smoking at age 64.  Smokes10 cigarettes a day   Outpatient Medications Prior to Visit  Medication Sig Dispense Refill   albuterol  (PROVENTIL ) (2.5 MG/3ML) 0.083% nebulizer solution Take 3 mLs (2.5 mg total) by nebulization every 6 (six) hours as needed for wheezing or shortness of breath. 270 mL 5   albuterol  (VENTOLIN  HFA) 108 (90 Base) MCG/ACT inhaler Inhale 2 puffs into the lungs every 6 (six) hours as needed for wheezing or shortness of breath. 26.8 g 2   amLODipine  (NORVASC ) 10 MG tablet Take 1 tablet (10 mg total) by mouth daily. 90 tablet 1   atorvastatin  (LIPITOR) 10 MG tablet Take 1 tablet (10 mg total) by mouth  daily. 90 tablet 1   budesonide-glycopyrrolate-formoterol (BREZTRI  AEROSPHERE) 160-9-4.8 MCG/ACT AERO inhaler Inhale 2 puffs into the lungs in the morning and at bedtime.     divalproex  (DEPAKOTE  ER) 500 MG 24 hr tablet Take 1,000 mg by mouth at bedtime.     gabapentin  (NEURONTIN ) 300 MG capsule Take 1 capsule (300 mg total) by mouth at bedtime. 30 capsule 3   meloxicam  (MOBIC ) 7.5 MG tablet Take 1 tablet (7.5 mg  total) by mouth daily. 30 tablet 1   sertraline (ZOLOFT) 100 MG tablet Take 100 mg by mouth daily.     buPROPion  (WELLBUTRIN  SR) 150 MG 12 hr tablet Take 1 tablet (150 mg total) by mouth 2 (two) times daily. (Patient not taking: Reported on 06/27/2024) 60 tablet 5   hydrocortisone  2.5 % cream Apply topically 2 (two) times daily. (Patient not taking: Reported on 06/27/2024) 30 g 1   mirabegron  ER (MYRBETRIQ ) 25 MG TB24 tablet Take 1 tablet (25 mg total) by mouth daily. (Patient not taking: Reported on 06/27/2024) 30 tablet 0   Respiratory Therapy Supplies (ADULT MASK) MISC Needs an mask for her nebulizer machine (Patient not taking: Reported on 06/27/2024) 1 each 0   doxycycline  (VIBRA -TABS) 100 MG tablet Take 1 tablet (100 mg total) by mouth 2 (two) times daily. (Patient not taking: Reported on 05/15/2024) 14 tablet 0   predniSONE  (DELTASONE ) 10 MG tablet 4 tabs tomorrow, then 3 tabs for 2 days, 2 tabs for 2 days, then 1 tab for 2 days, then stop 16 tablet 0   No facility-administered medications prior to visit.     Review of Systems:   Constitutional:   No  weight loss, night sweats,  Fevers, chills, +fatigue, or  lassitude.  HEENT:   No headaches,  Difficulty swallowing,  Tooth/dental problems, or  Sore throat,                No sneezing, itching, ear ache, nasal congestion, post nasal drip,   CV:  No chest pain,  Orthopnea, PND, swelling in lower extremities, anasarca, dizziness, palpitations, syncope.   GI  No heartburn, indigestion, abdominal pain, nausea, vomiting, diarrhea, change in bowel habits, loss of appetite, bloody stools.   Resp: .  No chest wall deformity  Skin: no rash or lesions.  GU: no dysuria, change in color of urine, no urgency or frequency.  No flank pain, no hematuria   MS:  No joint pain or swelling.  No decreased range of motion.  No back pain.    Physical Exam  BP 130/79   Pulse 77   Temp 98 F (36.7 C)   Ht 5' 4.25 (1.632 m)   Wt 159 lb 9.6 oz (72.4 kg)    SpO2 94% Comment: RA  BMI 27.18 kg/m   GEN: A/Ox3; pleasant , NAD, well nourished    HEENT:  [place]/AT, , NOSE-clear, THROAT-clear, no lesions, no postnasal drip or exudate noted.   NECK:  Supple w/ fair ROM; no JVD; normal carotid impulses w/o bruits; no thyromegaly or nodules palpated; no lymphadenopathy.    RESP  Clear  P & A; w/o, wheezes/ rales/ or rhonchi. no accessory muscle use, no dullness to percussion  CARD:  RRR, no m/r/g, no peripheral edema, pulses intact, no cyanosis or clubbing.  GI:   Soft & nt; nml bowel sounds; no organomegaly or masses detected.   Musco: Warm bil, no deformities or joint swelling noted.   Neuro: alert, no focal deficits noted.    Skin:  Warm, no lesions or rashes    Lab Results:Reviewed 06/27/2024   CBC   BMET   BNP   Imaging: DG Chest 2 View Result Date: 05/31/2024 EXAM: 2 VIEW(S) XRAY OF THE CHEST 05/31/2024 08:16:42 PM COMPARISON: 02/23/2024 CLINICAL HISTORY: SOB FINDINGS: LUNGS AND PLEURA: No focal pulmonary opacity. No pleural effusion. No pneumothorax. HEART AND MEDIASTINUM: No acute abnormality of the cardiac and mediastinal silhouettes. BONES AND SOFT TISSUES: No acute osseous abnormality. IMPRESSION: 1. No acute process. Electronically signed by: Elsie Gravely MD 05/31/2024 08:26 PM EST RP Workstation: [id number]    Administration History     None          Latest Ref Rng & Units 06/27/2024   10:22 AM  PFT Results  FVC-Pre L 1.57  P  FVC-Predicted Pre % 48  P  FVC-Post L 1.67  P  FVC-Predicted Post % 51  P  Pre FEV1/FVC % % 67  P  Post FEV1/FCV % % 60  P  FEV1-Pre L 1.05  P  FEV1-Predicted Pre % 42  P  FEV1-Post L 1.00  P  DLCO uncorrected ml/min/mmHg 11.34  P  DLCO UNC% % 56  P  DLCO corrected ml/min/mmHg 10.69  P  DLCO COR %Predicted % 53  P  DLVA Predicted % 72  P  TLC L 7.88  P  TLC % Predicted % 154  P  RV % Predicted % 289  P    P Preliminary result    No results found for: NITRICOXIDE      No  data to display              Assessment & Plan:   Assessment and Plan Assessment & Plan Chronic obstructive pulmonary disease-recent exacerbation now resolved. Patient has had frequent flareups in the last few months requiring antibiotics and steroid tapers.  Also seen in the emergency room .she has high risk for decompensation with severe airflow obstruction low pulmonary reserve.  Will continue on triple therapy with Breztri .  Add in Dupixent to help decrease exacerbations Smoking cessation is crucial. Continue Breztri  inhaler twice daily and albuterol  inhaler and nebulizer as needed. Dupixent therapy has been initiated pending insurance approval, with coordination from the pharmacy team for administration and insurance assistance. Smoking cessation is encouraged.  Tobacco use disorder  -ongoing Continued tobacco use contributes to COPD exacerbations and overall health risks. Wellbutrin  for smoking cessation was discussed, pending approval from her mental health provider due to concurrent Depakote  use. Discuss Wellbutrin  with the mental health provider for smoking cessation. Encourage smoking cessation through resources such as 1-800-QUIT-NOW. Continue with yearly lung cancer CT screening program.  Plan  Patient Instructions  Breztri  2 puffs Twice daily, rinse after use  Albuterol  inhaler /neb As needed  Begin Dupixent paperwork process for COPD  Work on quitting smoking -[id number]  CT chest yearly screening Activity as tolerated  Follow up with Dr. Theophilus in 3 months and As needed            Madelin Stank, NP 06/27/2024      [1] No Known Allergies [2]  Social History Tobacco Use  Smoking Status Every Day   Current packs/day: 1.00   Types: Cigarettes  Smokeless Tobacco Never  Tobacco Comments   Started smoking at age 10.  Smokes10 cigarettes a day   "

## 2024-06-28 NOTE — Telephone Encounter (Signed)
 Pt scheduled by Candyce for 2/18 at GI. Nothing further needed at this time.

## 2024-07-11 ENCOUNTER — Ambulatory Visit: Payer: Self-pay | Admitting: Family Medicine

## 2024-07-12 ENCOUNTER — Other Ambulatory Visit
